# Patient Record
Sex: Male | Born: 1950 | Race: White | Hispanic: No | Marital: Single | Smoking: Former smoker
Health system: Southern US, Community
[De-identification: ages and names within clinical notes are randomized; demographics above are authoritative.]

## PROBLEM LIST (undated history)

## (undated) DIAGNOSIS — E663 Overweight: Secondary | ICD-10-CM

## (undated) DIAGNOSIS — I2510 Atherosclerotic heart disease of native coronary artery without angina pectoris: Secondary | ICD-10-CM

## (undated) DIAGNOSIS — R55 Syncope and collapse: Secondary | ICD-10-CM

## (undated) DIAGNOSIS — G4730 Sleep apnea, unspecified: Secondary | ICD-10-CM

## (undated) DIAGNOSIS — I4821 Permanent atrial fibrillation: Secondary | ICD-10-CM

## (undated) DIAGNOSIS — G459 Transient cerebral ischemic attack, unspecified: Secondary | ICD-10-CM

## (undated) DIAGNOSIS — E119 Type 2 diabetes mellitus without complications: Secondary | ICD-10-CM

## (undated) DIAGNOSIS — C801 Malignant (primary) neoplasm, unspecified: Secondary | ICD-10-CM

## (undated) DIAGNOSIS — I10 Essential (primary) hypertension: Secondary | ICD-10-CM

## (undated) DIAGNOSIS — I4892 Unspecified atrial flutter: Secondary | ICD-10-CM

## (undated) DIAGNOSIS — E785 Hyperlipidemia, unspecified: Secondary | ICD-10-CM

## (undated) DIAGNOSIS — E669 Obesity, unspecified: Secondary | ICD-10-CM

## (undated) HISTORY — DX: Sleep apnea, unspecified: G47.30

## (undated) HISTORY — DX: Obesity, unspecified: E66.9

## (undated) HISTORY — DX: Overweight: E66.3

## (undated) HISTORY — PX: TONSILLECTOMY: SUR1361

## (undated) HISTORY — PX: WISDOM TOOTH EXTRACTION: SHX21

## (undated) HISTORY — DX: Syncope and collapse: R55

## (undated) HISTORY — DX: Atherosclerotic heart disease of native coronary artery without angina pectoris: I25.10

## (undated) HISTORY — DX: Hyperlipidemia, unspecified: E78.5

## (undated) HISTORY — DX: Essential (primary) hypertension: I10

## (undated) HISTORY — DX: Malignant (primary) neoplasm, unspecified: C80.1

## (undated) HISTORY — PX: OTHER SURGICAL HISTORY: SHX169

## (undated) HISTORY — DX: Unspecified atrial flutter: I48.92

## (undated) HISTORY — DX: Permanent atrial fibrillation: I48.21

---

## 2008-05-23 ENCOUNTER — Emergency Department (HOSPITAL_COMMUNITY): Admission: EM | Admit: 2008-05-23 | Discharge: 2008-05-23 | Payer: Self-pay | Admitting: Emergency Medicine

## 2008-06-15 ENCOUNTER — Inpatient Hospital Stay (HOSPITAL_BASED_OUTPATIENT_CLINIC_OR_DEPARTMENT_OTHER): Admission: RE | Admit: 2008-06-15 | Discharge: 2008-06-15 | Payer: Self-pay | Admitting: Cardiology

## 2008-06-20 HISTORY — PX: CARDIAC CATHETERIZATION: SHX172

## 2008-06-22 ENCOUNTER — Ambulatory Visit (HOSPITAL_COMMUNITY): Admission: RE | Admit: 2008-06-22 | Discharge: 2008-06-23 | Payer: Self-pay | Admitting: Interventional Cardiology

## 2008-07-20 ENCOUNTER — Encounter (HOSPITAL_COMMUNITY): Admission: RE | Admit: 2008-07-20 | Discharge: 2008-10-18 | Payer: Self-pay | Admitting: Interventional Cardiology

## 2008-10-20 ENCOUNTER — Encounter (HOSPITAL_COMMUNITY): Admission: RE | Admit: 2008-10-20 | Discharge: 2009-01-01 | Payer: Self-pay | Admitting: Interventional Cardiology

## 2010-03-21 ENCOUNTER — Encounter: Payer: Self-pay | Admitting: Internal Medicine

## 2010-03-29 DIAGNOSIS — R55 Syncope and collapse: Secondary | ICD-10-CM

## 2010-03-29 HISTORY — DX: Syncope and collapse: R55

## 2010-04-25 ENCOUNTER — Encounter: Payer: Self-pay | Admitting: Internal Medicine

## 2010-04-30 DIAGNOSIS — I2510 Atherosclerotic heart disease of native coronary artery without angina pectoris: Secondary | ICD-10-CM | POA: Insufficient documentation

## 2010-04-30 DIAGNOSIS — E782 Mixed hyperlipidemia: Secondary | ICD-10-CM | POA: Insufficient documentation

## 2010-04-30 DIAGNOSIS — I10 Essential (primary) hypertension: Secondary | ICD-10-CM | POA: Insufficient documentation

## 2010-04-30 DIAGNOSIS — I4892 Unspecified atrial flutter: Secondary | ICD-10-CM | POA: Insufficient documentation

## 2010-04-30 HISTORY — DX: Atherosclerotic heart disease of native coronary artery without angina pectoris: I25.10

## 2010-05-01 ENCOUNTER — Ambulatory Visit: Payer: Self-pay | Admitting: Internal Medicine

## 2010-05-01 DIAGNOSIS — R55 Syncope and collapse: Secondary | ICD-10-CM | POA: Insufficient documentation

## 2010-05-01 DIAGNOSIS — I4891 Unspecified atrial fibrillation: Secondary | ICD-10-CM | POA: Insufficient documentation

## 2010-05-28 ENCOUNTER — Encounter: Payer: Self-pay | Admitting: Internal Medicine

## 2010-06-07 ENCOUNTER — Telehealth: Payer: Self-pay | Admitting: Internal Medicine

## 2010-06-10 ENCOUNTER — Encounter: Payer: Self-pay | Admitting: Internal Medicine

## 2010-06-20 ENCOUNTER — Encounter: Payer: Self-pay | Admitting: Internal Medicine

## 2010-06-26 ENCOUNTER — Ambulatory Visit: Payer: Self-pay | Admitting: Internal Medicine

## 2010-07-01 ENCOUNTER — Telehealth: Payer: Self-pay | Admitting: Internal Medicine

## 2010-07-02 ENCOUNTER — Encounter: Payer: Self-pay | Admitting: Internal Medicine

## 2010-07-10 ENCOUNTER — Encounter: Payer: Self-pay | Admitting: Internal Medicine

## 2010-07-17 ENCOUNTER — Encounter: Payer: Self-pay | Admitting: Internal Medicine

## 2010-09-04 ENCOUNTER — Ambulatory Visit: Payer: Self-pay | Admitting: Internal Medicine

## 2010-11-17 LAB — CONVERTED CEMR LAB
BUN: 11 mg/dL (ref 6–23)
CO2: 28 meq/L (ref 19–32)
Calcium: 9.1 mg/dL (ref 8.4–10.5)
Chloride: 107 meq/L (ref 96–112)
Creatinine, Ser: 0.9 mg/dL (ref 0.4–1.5)
GFR calc non Af Amer: 92.97 mL/min (ref >60)
Glucose, Bld: 101 mg/dL — ABNORMAL HIGH (ref 70–99)
Potassium: 5.2 meq/L — ABNORMAL HIGH (ref 3.5–5.1)
Sodium: 142 meq/L (ref 135–145)

## 2010-11-19 NOTE — Letter (Signed)
Summary: [facility]  [facility]   Imported By: Marylou Mccoy 07/11/2010 16:12:34  _____________________________________________________________________  External Attachment:    Type:   Image     Comment:   External Document

## 2010-11-19 NOTE — Assessment & Plan Note (Signed)
Summary: PER CHECK OUT/SF   Visit Type:  Follow-up Referring Provider:  Dr Katrinka Blazing Primary Provider:  Bradd Canary   History of Present Illness: Arthur Rich is a pleasant 60 yo WM with a h/o paroxysmal atrial fibrillation and atrial flutter, CAD s/p LAD PCI, and HTN who presents today for EP follow-up.  He reports initially being diagnosed with atrial flutter 2009 after presenting for colonoscopy and being found to be in atrial flutter 05/23/2008.  He was placed on metoprolol.  He subsequently underwent [facility] 06/22/08 and was found to have CAD in the LAD for which he underwent PCI with a BMS. Upon routine follow-up 10/10, he was found to have atrial fibrillation on his EKG.  The patient was unaware of his afib.  He was therefore placed on coumadin at that time. He reports that 03/29/10, he had a syncopal episode after playing golf.  He had had several alcoholic beverages and had been in the heat all day.  He stood from a seated position and then had sypmtoms of presyncope followed by syncope.  He went to [facility] where he was told that he was in atrial flutter with elevated heart rate.  He was felt to be dehydrated.  He converted to sinus rhythm while being hydrated.  He did well until 04/19/10 when he was again playing golf.  He had been pushing by mouth fluids while playing golf.  He became concerned when he did not urinate and was brought to [facility].  He was found to have atrial fibrillation/ atrial flutter with elevated ventricular rates but converted to sinus rhythm with IV diltiazem.  The patient is unaware of triggers or precipitants of his atrial arrhythmias.  Upon last being seen in our clinic, he was initiated on multaq.  Despite medical therapy with multaq, he continues to have atrial arrhythmias with symptoms of fatigue.  Current Medications (verified): 1)  Metoprolol Succinate 100 Mg Xr24h-Tab (Metoprolol Succinate) .... Take One Tablet By Mouth Two Times A Day 2)   Aspirin 81 Mg Tbec (Aspirin) .... Take One Tablet By Mouth Daily 3)  Lipitor 20 Mg Tabs (Atorvastatin Calcium) .... Take One Tablet By Mouth Daily. 4)  Warfarin Sodium 5 Mg Tabs (Warfarin Sodium) .... Use As Directed By Anticoagulation Clinic 5)  Multaq 400 Mg Tabs (Dronedarone Hcl) .... One By Mouth Two Times A Day  Allergies: 1)  ! Penicillin  Past History:  Past Medical History: Reviewed history from 05/01/2010 and no changes required. Overweight CAD s/p PCI LAD with BMS 9/09 Paroxysmal atrial fibrillation and atrial flutter HTN HL syncope 03/29/10  Past Surgical History: Reviewed history from 05/01/2010 and no changes required. PCI 9/09 tonsellectomy nasal septal surgery wisdom teeth extraction  Social History: Reviewed history from 05/01/2010 and no changes required. Lives in [place] alone.  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  ETOH- no more than 2 beers most days.  Drugs- denies  Review of Systems       All systems are reviewed and negative except as listed in the HPI.   Vital Signs:  Patient profile:   60 year old male Height:      74 inches Weight:      219 pounds BMI:     28.22 Pulse rate:   74 / minute BP sitting:   120 / 70  (left arm)  Vitals Entered By: Laurance Flatten CMA (June 26, 2010 3:18 PM)  Physical Exam  General:  Well developed,  well nourished, in no acute distress. Head:  normocephalic and atraumatic Eyes:  PERRLA/EOM intact; conjunctiva and lids normal. Mouth:  Teeth, gums and palate normal. Oral mucosa normal. Neck:  Neck supple, no JVD. No masses, thyromegaly or abnormal cervical nodes. Lungs:  Clear bilaterally to auscultation and percussion. Heart:  Non-displaced PMI, chest non-tender; regular rate and rhythm, S1, S2 without murmurs, rubs or gallops. Carotid upstroke normal, no bruit. Normal abdominal aortic size, no bruits. Femorals normal pulses, no bruits. Pedals normal pulses. No edema, no  varicosities. Abdomen:  Bowel sounds positive; abdomen soft and non-tender without masses, organomegaly, or hernias noted. No hepatosplenomegaly. Msk:  Back normal, normal gait. Muscle strength and tone normal. Pulses:  pulses normal in all 4 extremities Extremities:  No clubbing or cyanosis. Neurologic:  Alert and oriented x 3.   EKG  Procedure date:  06/26/2010  Findings:      typical appearing atrial flutter,  V rate 75 bpm,  Qtc 430  Impression & Recommendations:  Problem # 1:  ATRIAL FIBRILLATION, PAROXYSMAL (ICD-427.31) The patient has both atrial fibrillation and atrial flutter.  I have reviewed his recent event monitor which reveals more afib than atrial flutter.  V rates are elevated at times.   He reports symptoms of fatigue.  He remains in atrial flutter despite medical therapy with multaq. Given the recent results of the Pallis trial, he and I are both concerned about using multaq for rate control with his persistent atrial arrhythmias.  We therefore agree to stop multaq at this time.  I have discussed tikosyn as a  reasonable alternative.  We also discussed chronic rate control and ablation as alternative strategies.  At this point, he wishes to further contemplate his options.  He will return in 4 weeks to decide which approach to take.  He is presently anticoagulated with coumadin.  I have reviewed the recent echo from [facility] which reveals preserved EF with mild LA enlargement (4.1cm).  He does not have significant valvular heart disease.  Problem # 2:  ESSENTIAL HYPERTENSION, BENIGN (ICD-401.1) stable  Problem # 3:  SYNCOPE (ICD-780.2) no further episodes  Other Orders: EKG w/ Interpretation (93000)  Patient Instructions: 1)  Your physician recommends that you schedule a follow-up appointment in: 4 weeks 2)  Your physician has recommended you make the following change in your medication: stop Multaq

## 2010-11-19 NOTE — Assessment & Plan Note (Signed)
Summary: nep/eval ablation/afib   Referring Provider:  Dr Katrinka Blazing Primary Provider:  Bradd Canary  CC:  atrial fibrillation and atrial flutter.  History of Present Illness: [name] is a pleasant 60 yo WM with a h/o paroxysmal atrial fibrillation and atrial flutter, CAD s/p LAD PCI, and HTN who presents today for EP consultation regarding his atrial arrhythmias.  He reports initially being diagnosed with atrial flutter 2009 after presenting for colonoscopy and being found to be in atrial flutter 05/23/2008.  He was placed on metoprolol.  He subsequently underwent [facility] 06/22/08 and was found to have CAD in the LAD for which he underwent PCI with a BMS. Upon routine follow-up 10/10, he was found to have atrial fibrillation on his EKG.  The patient was unaware of his afib.  He was therefore placed on coumadin at that time. He reports that 03/29/10, he had a syncopal episode after playing golf.  He had had several alcoholic beverages and had been in the heat all day.  He stood from a seated position and then had sypmtoms of presyncope followed by syncope.  He went to [facility] where he was told that he was in atrial flutter with elevated heart rate.  He was felt to be dehydrated.  He converted to sinus rhythm while being hydrated.  He did well until 04/19/10 when he was again playing golf.  He had been pushing by mouth fluids while playing golf.  He became concerned when he did not urinate and was brought to [facility].  He was found to have atrial fibrillation/ atrial flutter with elevated ventricular rates but converted to sinus rhythm with IV diltiazem.  The patient is unaware of triggers or precipitants of his atrial arrhythmias.  Current Medications (verified): 1)  Metoprolol Succinate 50 Mg Xr24h-Tab (Metoprolol Succinate) .... 2 Tabs Two Times A Day 2)  Aspirin 81 Mg Tbec (Aspirin) .... Take One Tablet By Mouth Daily 3)  Lipitor 20 Mg Tabs (Atorvastatin Calcium) .... Take One  Tablet By Mouth Daily. 4)  Warfarin Sodium 5 Mg Tabs (Warfarin Sodium) .... Use As Directed By Anticoagulation Clinic  Allergies (verified): 1)  ! 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 Maternal uncle had an MI early 17s.  Social History: Lives in [place] alone.  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  ETOH- no more than 2 beers most days.  Drugs- denies  [[id number]]  Review of Systems       All systems are reviewed and negative except as listed in the HPI.   Vital Signs:  Patient profile:   60 year old male Height:      74 inches Weight:      262 pounds BMI:     33.76 Pulse rate:   80 / minute BP sitting:   144 / 92  (left arm)  Vitals Entered By: Laurance Flatten CMA (May 01, 2010 3:58 PM)  Physical Exam  General:  Well developed, well nourished, in no acute distress. Head:  normocephalic and atraumatic Eyes:  PERRLA/EOM intact; conjunctiva and lids normal. Mouth:  Teeth, gums and palate normal. Oral mucosa normal. Neck:  Neck supple, no JVD. No masses, thyromegaly or abnormal cervical nodes. Lungs:  Clear  bilaterally to auscultation and percussion. Heart:  Non-displaced PMI, chest non-tender; regular rate and rhythm, S1, S2 without murmurs, rubs or gallops. Carotid upstroke normal, no bruit. Normal abdominal aortic size, no bruits. Femorals normal pulses, no bruits. Pedals normal pulses. No edema, no varicosities. Abdomen:  Bowel sounds positive; abdomen soft and non-tender without masses, organomegaly, or hernias noted. No hepatosplenomegaly. Msk:  Back normal, normal gait. Muscle strength and tone normal. Pulses:  pulses normal in all 4 extremities Extremities:   No clubbing or cyanosis. Neurologic:  Alert and oriented x 3. Skin:  Intact without lesions or rashes. Cervical Nodes:  no significant adenopathy Psych:  Normal affect.   EKG  Procedure date:  05/01/2010  Findings:      sinus bradycardia 58 bpm, PR 156, Qtc , otherwise normal ekg  Holter Monitor  Procedure date:  03/27/2010  Findings:      The patient has both coarse afib and atrial flutter (more fib than flutter).  He is in an atrial arrhythmia 37% of the time.  HR range 31 (nocturnal) to 158 bpm, Average HR 68 bpm 3 second post conversion pause noted.  Comments:      Placed by Dr Halina Andreas Cardiology  Impression & Recommendations:  Problem # 1:  ATRIAL FIBRILLATION, PAROXYSMAL (ICD-427.31) The patient has both atrial fibrillation and atrial flutter.  I have reviewed his recent event monitor which reveals more afib than atrial flutter.  V rates are elevated at times.  He also has bradycardia, more pronounced at night.  The patient has minimal symptoms.  Therapeutic strategies for afib including medicine and ablation were discussed in detail with the patient today. Risk, benefits, and alternatives to EP study and radiofrequency ablation for afib were also discussed in detail today. As he has failed rate control, I would recommend that we begin a rhyhtm control strategy at this time.  He has known CAD and is therefore not a candidate for Ic agents.  We will start Multaq 400mg  two times a day today.  IF he does not tolerate multaq, then I would consider tikosyn.  Given a relative paucity of symptoms, I would not proceed directly to ablation of afib or atrial flutter at this time.  His CHADS2 score is 1.  He is appropriately anticoagulated with coumadin.  Problem # 2:  CAD (ICD-414.00) stable no changes today  Problem # 3:  SYNCOPE (ICD-780.2) The patient's syncopal episode in June was likely due to dehydration by history.  His ekg today is benign appearing.  I will ask Dr  Katrinka Blazing to perform an echo to evaluate for structual heart disease.  The patient's last echo was several years ago (I do not have access to this).  Patient Instructions: 1)  Your physician recommends that you schedule a follow-up appointment in: 8 weeks with Dr Johney Frame 2)  Your physician has recommended you make the following change in your medication: start Multaq 400mg  two times a day Prescriptions: MULTAQ 400 MG TABS (DRONEDARONE HCL) one by mouth two times a day  #60 x 3   Entered by:   Dennis Bast, RN, BSN   Authorized by:   Hillis Range, MD   Signed by:   Dennis Bast, RN, BSN on 05/01/2010   Method used:   Electronically to        [id number] (retail)       [address]       [place], [place]  [address]       Ph:  [phone] or [phone]       Fax: [phone]   RxID:   [id number]

## 2010-11-19 NOTE — Letter (Signed)
Summary: Arthur Rich   Imported By: Marylou Mccoy 07/16/2010 09:41:19  _____________________________________________________________________  External Attachment:    Type:   Image     Comment:   External Document

## 2010-11-19 NOTE — Progress Notes (Signed)
Summary: questions re ekg  Phone Note Call from Patient   Caller: Patient Reason for Call: Talk to Nurse Summary of Call: pt to have weekly ekg's at work and the nurse there wants to know if it needs to be a 12 lead ekg or will a rhythm strip do? pt [phone] Initial call taken by: Glynda Jaeger,  July 01, 2010 2:15 PM  Follow-up for Phone Call        Did you want him to have weekly EKG's?  He is due for follow up on 07/29/10 at 3:15pm.  He still hasn't decided about ablation yet.  Will call pt back after DrAllred reviews Dennis Bast, RN, BSN  July 01, 2010 2:26 PM  Additional Follow-up for Phone Call Additional follow up Details #1::        12 lead ekgs are preferred Dr Hillis Range Pt aware and will have EKG's faxed Dennis Bast, RN, BSN  July 03, 2010 4:01 PM

## 2010-11-19 NOTE — Progress Notes (Signed)
Summary: appt  Phone Note Call from Patient Call back at Home Phone [phone]   Caller: Patient Summary of Call: pt calling to set up an echo states Dr Johney Frame wanted him to have an echo. I do not see an order for this. please call pt concerning the echo Initial call taken by: Edman Circle,  June 07, 2010 2:29 PM  Follow-up for Phone Call        spoke with  [facility]  Dr. Michaelle Copas nurse she has already spoken with pt and Bonita Quin will call to schedule Echo Monday. Dennis Bast, RN, BSN  June 07, 2010 4:56 PM

## 2010-11-19 NOTE — Assessment & Plan Note (Signed)
Summary: PER CHECK OUT/SF   Referring Provider:  Dr Katrinka Blazing Primary Provider:  Bradd Canary  CC:  ROV; No Complaints.  History of Present Illness: The patient presents today for routine electrophysiology followup. He reports doing very well since last being seen in our clinic.  He is unaware of any further afib or atrial flutter. The patient denies symptoms of palpitations, chest pain, shortness of breath, orthopnea, PND, lower extremity edema, dizziness, presyncope, syncope, or neurologic sequela. The patient is tolerating medications without difficulties and is otherwise without complaint today.   Problems Prior to Update: 1)  Syncope  (ICD-780.2) 2)  Atrial Fibrillation, Paroxysmal  (ICD-427.31) 3)  Hyperlipidemia  (ICD-272.4) 4)  Cad  (ICD-414.00) 5)  Essential Hypertension, Benign  (ICD-401.1) 6)  Encounter For Long-term Use of Anticoagulants  (ICD-V58.61) 7)  Atrial Flutter  (ICD-427.32)  Medications Prior to Update: 1)  Metoprolol Succinate 100 Mg Xr24h-Tab (Metoprolol Succinate) .... Take One Tablet By Mouth Two Times A Day 2)  Aspirin 81 Mg Tbec (Aspirin) .... Take One Tablet By Mouth Daily 3)  Lipitor 20 Mg Tabs (Atorvastatin Calcium) .... Take One Tablet By Mouth Daily. 4)  Warfarin Sodium 5 Mg Tabs (Warfarin Sodium) .... Use As Directed By Anticoagulation Clinic  Current Medications (verified): 1)  Metoprolol Succinate 100 Mg Xr24h-Tab (Metoprolol Succinate) .... Take One Tablet By Mouth Two Times A Day 2)  Aspirin 81 Mg Tbec (Aspirin) .... Take One Tablet By Mouth Daily 3)  Lipitor 20 Mg Tabs (Atorvastatin Calcium) .... Take One Tablet By Mouth Daily. 4)  Warfarin Sodium 5 Mg Tabs (Warfarin Sodium) .... Use As Directed By Anticoagulation Clinic  Allergies: 1)  ! Penicillin  Past History:  Past Medical History: Reviewed history from 05/01/2010 and no changes required. Overweight CAD s/p PCI LAD with BMS 9/09 Paroxysmal atrial fibrillation and atrial  flutter HTN HL syncope 03/29/10  Past Surgical History: Reviewed history from 05/01/2010 and no changes required. PCI 9/09 tonsellectomy nasal septal surgery wisdom teeth extraction  Social History: Reviewed history from 05/01/2010 and no changes required. Lives in [place] alone.  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  ETOH- no more than 2 beers most days.  Drugs- denies  Review of Systems       All systems are reviewed and negative except as listed in the HPI.   Vital Signs:  Patient profile:   60 year old male Height:      73 inches Weight:      261 pounds BMI:     34.56 Pulse rate:   49 / minute BP sitting:   180 / 98  (left arm) Cuff size:   regular  Vitals Entered By: Stanton Kidney, EMT-P (September 04, 2010 11:44 AM)  Physical Exam  General:  Well developed, well nourished, in no acute distress. Head:  normocephalic and atraumatic Eyes:  PERRLA/EOM intact; conjunctiva and lids normal. Mouth:  Teeth, gums and palate normal. Oral mucosa normal. Neck:  Neck supple, no JVD. No masses, thyromegaly or abnormal cervical nodes. Lungs:  Clear bilaterally to auscultation and percussion. Heart:  Non-displaced PMI, chest non-tender; regular rate and rhythm, S1, S2 without murmurs, rubs or gallops. Carotid upstroke normal, no bruit. Normal abdominal aortic size, no bruits. Femorals normal pulses, no bruits. Pedals normal pulses. No edema, no varicosities. Abdomen:  Bowel sounds positive; abdomen soft and non-tender without masses, organomegaly, or hernias noted. No hepatosplenomegaly. Msk:  Back normal, normal gait. Muscle strength and tone normal. Pulses:  pulses  normal in all 4 extremities Extremities:  No clubbing or cyanosis. Neurologic:  Alert and oriented x 3.   EKG  Procedure date:  09/04/2010  Findings:      sinus rhythm 50 bpm, otherwise normal ekg  Impression & Recommendations:  Problem # 1:  ATRIAL FIBRILLATION, PAROXYSMAL  (ICD-427.31)  His updated medication list for this problem includes:    Metoprolol Succinate 100 Mg Xr24h-tab (Metoprolol succinate) .Marland Kitchen... Take one tablet by mouth two times a day    Aspirin 81 Mg Tbec (Aspirin) .Marland Kitchen... Take one tablet by mouth daily    Warfarin Sodium 5 Mg Tabs (Warfarin sodium) ..... Use as directed by anticoagulation clinic  His updated medication list for this problem includes:    Metoprolol Succinate 100 Mg Xr24h-tab (Metoprolol succinate) .Marland Kitchen... Take one tablet by mouth two times a day    Aspirin 81 Mg Tbec (Aspirin) .Marland Kitchen... Take one tablet by mouth daily    Warfarin Sodium 5 Mg Tabs (Warfarin sodium) ..... Use as directed by anticoagulation clinic  Orders: TLB-BMP (Basic Metabolic Panel-BMET) (80048-METABOL)  Problem # 2:  ESSENTIAL HYPERTENSION, BENIGN (ICD-401.1) above goal add lisinopril 10mg  daily today BMET today he is instructed to follow-up with Dr Katrinka Blazing for BP management and BMET in the next few months  Problem # 3:  CAD (ICD-414.00)  no symptomats of ischemia  His updated medication list for this problem includes:    Metoprolol Succinate 100 Mg Xr24h-tab (Metoprolol succinate) .Marland Kitchen... Take one tablet by mouth two times a day    Aspirin 81 Mg Tbec (Aspirin) .Marland Kitchen... Take one tablet by mouth daily    Warfarin Sodium 5 Mg Tabs (Warfarin sodium) ..... Use as directed by anticoagulation clinic    Lisinopril 10 Mg Tabs (Lisinopril) ..... One by mouth daily  Patient Instructions: 1)  Your physician wants you to follow-up in:  6 months with Dr Johney Frame  Bonita Quin will receive a reminder letter in the mail two months in advance. If you don't receive a letter, please call our office to schedule the follow-up appointment. 2)  Your physician has recommended you make the following change in your medication: start Lisinopril 10mg  daily 3)  Your physician recommends that you return for lab work BMP Prescriptions: LISINOPRIL 10 MG TABS (LISINOPRIL) one by mouth daily  #30 x 11    Entered by:   Dennis Bast, RN, BSN   Authorized by:   Hillis Range, MD   Signed by:   Dennis Bast, RN, BSN on 09/04/2010   Method used:   Electronically to        [id number] (retail)       [address]       [place], [place]  [address]       Ph: [phone] or [phone]       Fax: [phone]   RxID:   [id number]

## 2010-11-19 NOTE — Letter (Signed)
Summary: [facility] Office Visit Note   [facility] Physicians Office Visit Note   Imported By: Roderic Ovens 05/10/2010 11:25:04  _____________________________________________________________________  External Attachment:    Type:   Image     Comment:   External Document

## 2010-11-19 NOTE — Letter (Signed)
Summary: Arthur Rich Physicians Office Visit Note   [facility] Physicians Office Visit Note   Imported By: Roderic Ovens 05/10/2010 11:24:32  _____________________________________________________________________  External Attachment:    Type:   Image     Comment:   External Document

## 2011-02-19 ENCOUNTER — Encounter: Payer: Self-pay | Admitting: Internal Medicine

## 2011-02-26 ENCOUNTER — Encounter: Payer: Self-pay | Admitting: Internal Medicine

## 2011-02-26 ENCOUNTER — Ambulatory Visit (INDEPENDENT_AMBULATORY_CARE_PROVIDER_SITE_OTHER): Payer: BC Managed Care – PPO | Admitting: Internal Medicine

## 2011-02-26 DIAGNOSIS — I2510 Atherosclerotic heart disease of native coronary artery without angina pectoris: Secondary | ICD-10-CM

## 2011-02-26 DIAGNOSIS — I10 Essential (primary) hypertension: Secondary | ICD-10-CM

## 2011-02-26 DIAGNOSIS — R55 Syncope and collapse: Secondary | ICD-10-CM

## 2011-02-26 DIAGNOSIS — I4891 Unspecified atrial fibrillation: Secondary | ICD-10-CM

## 2011-02-26 NOTE — Patient Instructions (Signed)
Your physician wants you to follow-up in: 6 months with Dr Allred You will receive a reminder letter in the mail two months in advance. If you don't receive a letter, please call our office to schedule the follow-up appointment.  Your physician recommends that you continue on your current medications as directed. Please refer to the Current Medication list given to you today.   

## 2011-03-02 ENCOUNTER — Encounter: Payer: Self-pay | Admitting: Internal Medicine

## 2011-03-02 NOTE — Assessment & Plan Note (Addendum)
No ischemic symptoms  Pt to follow with Dr Katrinka Blazing.

## 2011-03-02 NOTE — Assessment & Plan Note (Signed)
No recurrence EKG and echo has been benign Prior syncope likely due to dehydration We will follow.

## 2011-03-02 NOTE — Assessment & Plan Note (Addendum)
He continues to have episodes of atrial fibrillation/ atrial flutter but does not wish to start AAD at this time. He has made significant effort to avoid ETOH.  We should consider either catheter ablation or AAD if his afib burden increases, however he wishes to do neither at this time. Continue coumadin.

## 2011-03-02 NOTE — Progress Notes (Signed)
The patient presents today for routine electrophysiology followup.  Since last being seen in our clinic, the patient reports doing very well.  He remains active.  He has occasional episodes of irregular palpitations which he attributes to atrial fibrillation.  Episodes typically last less than 1 hour.  He has made significant efforts to quit drinking ETOH.  He wishes to avoid AAD at this time.  Today, he denies symptoms of chest pain, shortness of breath, orthopnea, PND, lower extremity edema, dizziness, presyncope, syncope, or neurologic sequela.  The patient feels that he is tolerating medications without difficulties and is otherwise without complaint today.   Past Medical History  Diagnosis Date  . Overweight   . Coronary artery disease     PCI LAD with BMS 9/09  . Paroxysmal atrial fibrillation   . Hypertension   . Hyperlipidemia   . Syncope and collapse 03/29/2010   Past Surgical History  Procedure Date  . Cardiac catheterization 9/09    PCI LAD (BMS)  . Tonsillectomy   . Nasal septal surgery   . Wisdom tooth extraction     Current Outpatient Prescriptions  Medication Sig Dispense Refill  . aspirin 81 MG tablet Take 81 mg by mouth daily.        Marland Kitchen atorvastatin (LIPITOR) 20 MG tablet Take 20 mg by mouth daily.        . cholecalciferol (VITAMIN D) 1000 UNITS tablet Take 1,000 Units by mouth daily.        Marland Kitchen lisinopril (PRINIVIL,ZESTRIL) 10 MG tablet Take 10 mg by mouth daily.        . metoprolol (TOPROL-XL) 100 MG 24 hr tablet Take 100 mg by mouth 2 (two) times daily.        Marland Kitchen warfarin (COUMADIN) 5 MG tablet Take 5 mg by mouth daily. Use as directed by anticoagulation clinic         Allergies  Allergen Reactions  . Penicillins     History   Social History  . Marital Status: Single    Spouse Name: N/A    Number of Children: N/A  . Years of Education: N/A   Occupational History  . Not on file.   Social History Main Topics  . Smoking status: Never Smoker   . Smokeless  tobacco: Not on file  . Alcohol Use: Yes     previously heavy, now trying to cut back  . Drug Use: No  . Sexually Active: Not on file   Other Topics Concern  . Not on file   Social History Narrative  . No narrative on file    Family History  Problem Relation Age of Onset  . Diabetes Father   . Heart attack Maternal Uncle     early 50's    ROS-  All systems are reviewed and are negative except as outlined in the HPI above    Physical Exam: Filed Vitals:   02/26/11 1631  BP: 110/84  Pulse: 62  Height: 6\' 1"  (1.854 m)  Weight: 260 lb (117.935 kg)    GEN- The patient is well appearing, alert and oriented x 3 today.   Head- normocephalic, atraumatic Eyes-  Sclera clear, conjunctiva pink Ears- hearing intact Oropharynx- clear Neck- supple, no JVP Lymph- no cervical lymphadenopathy Lungs- Clear to ausculation bilaterally, normal work of breathing Heart- Regular rate and rhythm, no murmurs, rubs or gallops, PMI not laterally displaced GI- soft, NT, ND, + BS Extremities- no clubbing, cyanosis, or edema MS- no significant deformity or atrophy  Skin- no rash or lesion Psych- euthymic mood, full affect Neuro- strength and sensation are intact  EKG today reveals sinus rhythm 62 bpm, PR 162, QRS 84, Qtc 416, otherwise normal ekg  Echo from 06/10/10 reviewed Assessment and Plan:

## 2011-03-02 NOTE — Assessment & Plan Note (Signed)
Controlled. No changes. 

## 2011-03-04 NOTE — Cardiovascular Report (Signed)
Arthur Rich, Arthur Rich             ACCOUNT NO.:  [id number]   MEDICAL RECORD NO.:  [id number]          PATIENT TYPE:  OIB   LOCATION:  6527                         FACILITY:  [facility]   PHYSICIAN:  Lyn Records, M.D.   DATE OF BIRTH:  Aug 26, 1951   DATE OF PROCEDURE:  06/22/2008  DATE OF DISCHARGE:                            CARDIAC CATHETERIZATION   PRIMARY CARE PHYSICIAN:  Quita Skye. Artis Flock, M.D.   INDICATION FOR PROCEDURE:  Recently diagnosed high-grade eccentric focal  proximal LAD lesion.  Positive exercise treadmill test.  No symptoms.  Silent ischemia with large ischemic burden and relatively low threshold  for ischemia induction despite absence of symptoms.   PROCEDURE:  1. Bare metal stent proximal LAD.  2. Intravascular ultrasound.   DESCRIPTION:  After informed consent and following 2 mg of IV Versed and  50 mcg of fentanyl, 6-French sheath was placed in the right femoral  artery using modified Seldinger technique.  A 1% Xylocaine was used for  local anesthesia.  A 6-French 3.5 CLS guide catheter was then advanced  to the ascending aorta and the left main ostium.  Guiding shots were  obtained.  A bolus followed by an infusion of bivalirudin 0.75  mg/kg/1.75 mg/kg per hour, was administered to achieve an ACT greater  than 300.  The patient received 600 mg of Plavix in the day hospital.  The patient had been on oral Plavix for at least a week prior to coming  to the hospital.  An [facility] guidewire was used to close the  stenosis in the LAD.  We attempted to wire a large first diagonal, but  because of a severe angulation in the origin from the LAD, we could not  easily accomplish this and decided to proceed with stenting without side  branch protection.   We initially wanted to perform all of these before progressing to  intervention as the lesion was very eccentric and difficult to lay out.  Because the IVUS machine was being used in another room, we went ahead  and  did direct stenting of the proximal LAD lesion using a Liberty 12 x  3.5 mm balloon.  We did 2 inflations with peak pressure of 13  atmospheres.  We then postdilated with an 8-mm long x 3.75-mm diameter  balloon to 13 atmospheres.  The patient experienced no chest discomfort  with any of the balloon inflations.   We were ultimately able to perform all of these post stent deployment  and demonstrated no significant regions of under expansion or stent  malapposition.  The distal reference diameter was 3.21 x 3.87 just  outside the distal margin of the stent.  This region of the LAD was  actually larger than the proximal LAD.   The percent stenosis was reduced from greater than and equal to 80%-0%  with TIMI grade 3 flow.   Angio-Seal was used for hemostasis with no complications.   CONCLUSIONS:  1. Successful direct stenting of the proximal LAD eccentric lesion      from 70-80% down to 0% with TIMI grade 3 flow.  2. IVUS demonstration of  stent deployment without evidence of      malapposition or under expansion.   PLAN:  Plavix for at least [redacted] weeks along with aspirin.   DISCHARGE:  June 23, 2008.       Lyn Records, M.D.  Electronically Signed     HWS/MEDQ  D:  06/22/2008  T:  06/23/2008  Job:  [id number]   cc:   Quita Skye. Artis Flock, M.D.

## 2011-03-04 NOTE — Consult Note (Signed)
Arthur Rich, Arthur Rich             ACCOUNT NO.:  [id number]   MEDICAL RECORD NO.:  [id number]          PATIENT TYPE:  EMS   LOCATION:  MAJO                         FACILITY:  [facility]   PHYSICIAN:  Lyn Records, M.D.   DATE OF BIRTH:  09/24/1951   DATE OF CONSULTATION:  05/23/2008  DATE OF DISCHARGE:                                 CONSULTATION   REASON FOR EVALUATION:  Atrial flutter.   SUBJECTIVE:  The patient is a 60 year old gentleman who was at the [facility] when prior to his colonoscopy, he was noted to be in  a narrow complex tachycardia about 150 beats per minute.  On further  evaluation, it was felt he was in atrial flutter.  He was transported to  the emergency room.  By the time he arrived in the ER, the heart rhythm  had dissipated.  The patient was asymptomatic throughout the entire or  deal.  He has no history of prior arrhythmias.  He denies chest pain,  orthopnea, PND, exertional dyspnea, or syncope.   SIGNIFICANT PAST MEDICAL PROBLEMS:  1. Hyperlipidemia.  2. Elevated, but untreated blood pressure.   ALLERGIES:  PENICILLIN.   MEDICATIONS:  Lipitor 10 mg per day.   PAST MEDICAL HISTORY:  Sinus surgery in 1970.   FAMILY HISTORY:  Father died suddenly at age 31, had a history of  coronary artery disease and diabetes.  Mother is alive and well.  Three  sisters are alive and well.   REVIEW OF SYSTEMS:  Unremarkable.   SOCIAL HISTORY:  Does not smoke, quit smoking 9 years ago, smoked about  50-pack years.  Two to three beers per day, no more than 20 per week.  He is a Engineer, building services at [employer].   PHYSICAL EXAMINATION:  GENERAL:  The patient is in no acute distress.  VITAL SIGNS:  Blood pressure is 160/90, heart rate 90, and respirations  16.  SKIN:  Clear.  HEENT:  Unremarkable.  Multiple dental caries are noted.  LUNGS:  Clear.  CARDIAC:  No murmur, no rub, no click, or no gallop.  ABDOMEN:  Soft.  EXTREMITIES:  No edema.  Lower  extremities reveal varicose veins, left  leg greater than right.  NEUROLOGIC:  No motor or sensory deficits.   EKG reveals normal sinus rhythm QS pattern in V1 and V2, rhythm strips  of the arrhythmia revealed definite atrial flutter with ventricular  response of 150 beats per minute.  Chest x-ray, not available.  Initial  cardiac POC marker is normal.   ASSESSMENT:  1. Paroxysmal atrial flutter, asymptomatic.  2. Elevated blood pressure.   PLAN:  1. Rule out MI with serial markers.  2. Check TSH.  3. Outpatient Holter and echo.  4. May need stress test.  5. May need therapy for hypertension.  6. Clinical followup within the next 7 days.      Lyn Records, M.D.  Electronically Signed     HWS/MEDQ  D:  05/23/2008  T:  05/24/2008  Job:  [id number]   cc:   Quita Skye. Artis Flock, M.D.  John C.  Madilyn Fireman, M.D.

## 2011-07-18 LAB — BASIC METABOLIC PANEL
BUN: 8
CO2: 26
Calcium: 9.3
Chloride: 108
Creatinine, Ser: 0.98
GFR calc Af Amer: >60
GFR calc non Af Amer: >60
Glucose, Bld: 113 — ABNORMAL HIGH
Potassium: 4
Sodium: 141

## 2011-07-18 LAB — TSH: TSH: 1.052

## 2011-07-18 LAB — DIFFERENTIAL
Basophils Absolute: 0
Basophils Relative: 0
Eosinophils Absolute: 0
Eosinophils Relative: 1
Lymphocytes Relative: 11 — ABNORMAL LOW
Lymphs Abs: 1.1
Monocytes Absolute: 0.6
Monocytes Relative: 6
Neutro Abs: 7.6
Neutrophils Relative %: 81 — ABNORMAL HIGH

## 2011-07-18 LAB — POCT CARDIAC MARKERS
CKMB, poc: 1.6
CKMB, poc: 1.7
Myoglobin, poc: 110
Myoglobin, poc: 85.7
Troponin i, poc: <0.05
Troponin i, poc: <0.05

## 2011-07-18 LAB — CBC
HCT: 44.7
Hemoglobin: 15.7
MCHC: 35.1
MCV: 87
Platelets: 235
RBC: 5.14
RDW: 13.8
WBC: 9.3

## 2011-07-23 LAB — BASIC METABOLIC PANEL
BUN: 9
CO2: 25
Calcium: 9.1
Chloride: 107
Creatinine, Ser: 0.85
GFR calc Af Amer: >60
GFR calc non Af Amer: >60
Glucose, Bld: 103 — ABNORMAL HIGH
Potassium: 3.6
Sodium: 140

## 2011-07-23 LAB — CBC
HCT: 42.1
Hemoglobin: 14.3
MCHC: 33.9
MCV: 89.9
Platelets: 200
RBC: 4.68
RDW: 14.1
WBC: 7.2

## 2011-07-23 LAB — POCT I-STAT, CHEM 8
BUN: 12
Calcium, Ion: 1.12
Chloride: 108
Creatinine, Ser: 1
Glucose, Bld: 124 — ABNORMAL HIGH
HCT: 42
Hemoglobin: 14.3
Potassium: 3.8
Sodium: 141
TCO2: 25

## 2011-08-27 ENCOUNTER — Ambulatory Visit (INDEPENDENT_AMBULATORY_CARE_PROVIDER_SITE_OTHER): Payer: BC Managed Care – PPO | Admitting: Internal Medicine

## 2011-08-27 ENCOUNTER — Encounter: Payer: Self-pay | Admitting: Internal Medicine

## 2011-08-27 VITALS — BP 129/81 | HR 80 | Ht 74.0 in | Wt 261.0 lb

## 2011-08-27 DIAGNOSIS — R55 Syncope and collapse: Secondary | ICD-10-CM

## 2011-08-27 DIAGNOSIS — I4891 Unspecified atrial fibrillation: Secondary | ICD-10-CM

## 2011-08-27 DIAGNOSIS — I10 Essential (primary) hypertension: Secondary | ICD-10-CM

## 2011-08-27 MED ORDER — LISINOPRIL 10 MG PO TABS: 10.0000 mg | ORAL_TABLET | Freq: Every day | ORAL | Status: DC

## 2011-08-27 NOTE — Assessment & Plan Note (Signed)
Stable No change required today  

## 2011-08-27 NOTE — Assessment & Plan Note (Signed)
No recurrence EKG and echo has been benign Prior syncope likely due to dehydration We will follow.

## 2011-08-27 NOTE — Progress Notes (Signed)
The patient presents today for routine electrophysiology followup.  Since last being seen in our clinic, the patient reports doing very well.  He remains active.  He has rare episodes of irregular palpitations which he attributes to atrial fibrillation.  Episodes typically last less than 1 hour.  He otherwise feels well and reports good exercise tolerance.  He denjies fatigue. Today, he denies symptoms of chest pain, shortness of breath, orthopnea, PND, lower extremity edema, dizziness, presyncope, syncope, or neurologic sequela.  The patient feels that he is tolerating medications without difficulties and is otherwise without complaint today.   Past Medical History  Diagnosis Date  . Overweight   . Coronary artery disease     PCI LAD with BMS 9/09  . Persistent atrial fibrillation   . Hypertension   . Hyperlipidemia   . Syncope and collapse 03/29/2010  . Atrial flutter    Past Surgical History  Procedure Date  . Cardiac catheterization 9/09    PCI LAD (BMS)  . Tonsillectomy   . Nasal septal surgery   . Wisdom tooth extraction     Current Outpatient Prescriptions  Medication Sig Dispense Refill  . aspirin 81 MG tablet Take 81 mg by mouth daily.        Marland Kitchen atorvastatin (LIPITOR) 20 MG tablet Take 20 mg by mouth daily.        . cholecalciferol (VITAMIN D) 1000 UNITS tablet Take 1,000 Units by mouth daily.        Marland Kitchen lisinopril (PRINIVIL,ZESTRIL) 10 MG tablet Take 1 tablet (10 mg total) by mouth daily.  30 tablet  6  . metoprolol (TOPROL-XL) 100 MG 24 hr tablet Take 100 mg by mouth 2 (two) times daily.        Marland Kitchen warfarin (COUMADIN) 5 MG tablet Take 5 mg by mouth daily. Use as directed by anticoagulation clinic       . DISCONTD: lisinopril (PRINIVIL,ZESTRIL) 10 MG tablet Take 10 mg by mouth daily.          Allergies  Allergen Reactions  . Penicillins     History   Social History  . Marital Status: Single    Spouse Name: N/A    Number of Children: N/A  . Years of Education: N/A    Occupational History  . Not on file.   Social History Main Topics  . Smoking status: Former Smoker -- 1.0 packs/day for 30 years    Types: Cigarettes    Quit date: 08/26/1996  . Smokeless tobacco: Never Used  . Alcohol Use: Yes     previously heavy, now trying to cut back  . Drug Use: No  . Sexually Active: Not on file   Other Topics Concern  . Not on file   Social History Narrative  . No narrative on file    Family History  Problem Relation Age of Onset  . Diabetes Father   . Heart attack Maternal Uncle     early 50's    ROS-  All systems are reviewed and are negative except as outlined in the HPI above    Physical Exam: Filed Vitals:   08/27/11 1614  BP: 129/81  Pulse: 80  Height: 6\' 2"  (1.88 m)  Weight: 261 lb (118.389 kg)    GEN- The patient is well appearing, alert and oriented x 3 today.   Head- normocephalic, atraumatic Eyes-  Sclera clear, conjunctiva pink Ears- hearing intact Oropharynx- clear Neck- supple, no JVP Lymph- no cervical lymphadenopathy Lungs- Clear to ausculation bilaterally,  normal work of breathing Heart- irregular rate and rhythm, no murmurs, rubs or gallops, PMI not laterally displaced GI- soft, NT, ND, + BS Extremities- no clubbing, cyanosis, or edema MS- no significant deformity or atrophy Skin- no rash or lesion Psych- euthymic mood, full affect Neuro- strength and sensation are intact  EKG today reveals afib , V rate 80 bpm  Echo from 06/10/10 reviewed Assessment and Plan:

## 2011-08-27 NOTE — Patient Instructions (Signed)
Your physician recommends that you schedule a follow-up appointment in: 5 weeks with Dr Johney Frame   Your physician has requested that you have an echocardiogram. Echocardiography is a painless test that uses sound waves to create images of your heart. It provides your doctor with information about the size and shape of your heart and how well your heart's chambers and valves are working. This procedure takes approximately one hour. There are no restrictions for this procedure.

## 2011-08-27 NOTE — Assessment & Plan Note (Signed)
He continues to have episodes of atrial fibrillation/ atrial flutter but does not wish to start AAD at this time.  He is in afib today but appears to be mostly asymptomatic. I have offered initiation of tikosyn vs rate control.  I would avoid catheter ablation presently given his relative absence of symptoms. He is clear that he wishes to continue rate control for now. He will continue coumadin for stroke prevention.  We discussed pradaxa and xarelto as alternatives, though he would like to stay with coumadin at this point. No changes today  We will obtain an echo as his last echo was 8/11. I will see him again in 6 weeks to further assess for symptoms.

## 2011-08-28 ENCOUNTER — Other Ambulatory Visit: Payer: Self-pay | Admitting: Internal Medicine

## 2011-09-03 ENCOUNTER — Ambulatory Visit (HOSPITAL_COMMUNITY): Payer: BC Managed Care – PPO | Attending: Cardiology | Admitting: Radiology

## 2011-09-03 DIAGNOSIS — I10 Essential (primary) hypertension: Secondary | ICD-10-CM | POA: Insufficient documentation

## 2011-09-03 DIAGNOSIS — I4891 Unspecified atrial fibrillation: Secondary | ICD-10-CM

## 2011-09-03 DIAGNOSIS — I059 Rheumatic mitral valve disease, unspecified: Secondary | ICD-10-CM | POA: Insufficient documentation

## 2011-09-03 DIAGNOSIS — I079 Rheumatic tricuspid valve disease, unspecified: Secondary | ICD-10-CM | POA: Insufficient documentation

## 2011-09-03 DIAGNOSIS — E785 Hyperlipidemia, unspecified: Secondary | ICD-10-CM | POA: Insufficient documentation

## 2011-10-07 ENCOUNTER — Ambulatory Visit: Payer: BC Managed Care – PPO | Admitting: Internal Medicine

## 2011-10-09 ENCOUNTER — Ambulatory Visit (INDEPENDENT_AMBULATORY_CARE_PROVIDER_SITE_OTHER): Payer: BC Managed Care – PPO | Admitting: Internal Medicine

## 2011-10-09 ENCOUNTER — Encounter: Payer: Self-pay | Admitting: Internal Medicine

## 2011-10-09 VITALS — BP 123/66 | HR 72 | Resp 18 | Ht 74.0 in | Wt 268.0 lb

## 2011-10-09 DIAGNOSIS — I4891 Unspecified atrial fibrillation: Secondary | ICD-10-CM

## 2011-10-09 DIAGNOSIS — I4892 Unspecified atrial flutter: Secondary | ICD-10-CM

## 2011-10-09 NOTE — Patient Instructions (Signed)
Your physician wants you to follow-up in: 6 months with Dr. Allred. You will receive a reminder letter in the mail two months in advance. If you don't receive a letter, please call our office to schedule the follow-up appointment.  

## 2011-10-09 NOTE — Progress Notes (Signed)
The patient presents today for routine electrophysiology followup.  Since last being seen in our clinic, the patient reports doing very well.  He remains active. He feels well and reports good exercise tolerance.  He denies fatigue. He feels that he is in sinus rhythm most of the time.  Today, he denies symptoms of chest pain, shortness of breath, orthopnea, PND, lower extremity edema, dizziness, presyncope, syncope, or neurologic sequela.  The patient feels that he is tolerating medications without difficulties and is otherwise without complaint today.   Past Medical History  Diagnosis Date  . Overweight   . Coronary artery disease     PCI LAD with BMS 9/09  . Persistent atrial fibrillation   . Hypertension   . Hyperlipidemia   . Syncope and collapse 03/29/2010  . Atrial flutter    Past Surgical History  Procedure Date  . Cardiac catheterization 9/09    PCI LAD (BMS)  . Tonsillectomy   . Nasal septal surgery   . Wisdom tooth extraction     Current Outpatient Prescriptions  Medication Sig Dispense Refill  . atorvastatin (LIPITOR) 20 MG tablet Take 20 mg by mouth daily.        . cholecalciferol (VITAMIN D) 1000 UNITS tablet Take 1,000 Units by mouth daily.        Marland Kitchen lisinopril (PRINIVIL,ZESTRIL) 10 MG tablet TAKE 1 TABLET BY MOUTH EVERY DAY  30 tablet  11  . metoprolol (TOPROL-XL) 100 MG 24 hr tablet Take 100 mg by mouth 2 (two) times daily.        Marland Kitchen warfarin (COUMADIN) 5 MG tablet Take 5 mg by mouth daily. Use as directed by anticoagulation clinic         Allergies  Allergen Reactions  . Penicillins     History   Social History  . Marital Status: Single    Spouse Name: N/A    Number of Children: N/A  . Years of Education: N/A   Occupational History  . Not on file.   Social History Main Topics  . Smoking status: Former Smoker -- 1.0 packs/day for 30 years    Types: Cigarettes    Quit date: 08/26/1996  . Smokeless tobacco: Never Used  . Alcohol Use: Yes     previously  heavy, now trying to cut back  . Drug Use: No  . Sexually Active: Not on file   Other Topics Concern  . Not on file   Social History Narrative  . No narrative on file    Family History  Problem Relation Age of Onset  . Diabetes Father   . Heart attack Maternal Uncle     early 27's     Physical Exam: Filed Vitals:   10/09/11 1558  BP: 123/66  Pulse: 72  Resp: 18  Height: 6\' 2"  (1.88 m)  Weight: 268 lb (121.564 kg)    GEN- The patient is well appearing, alert and oriented x 3 today.   Head- normocephalic, atraumatic Eyes-  Sclera clear, conjunctiva pink Ears- hearing intact Oropharynx- clear Neck- supple, no JVP Lymph- no cervical lymphadenopathy Lungs- Clear to ausculation bilaterally, normal work of breathing Heart- regular rate and rhythm, no murmurs, rubs or gallops, PMI not laterally displaced GI- soft, NT, ND, + BS Extremities- no clubbing, cyanosis, or edema MS- no significant deformity or atrophy Skin- no rash or lesion Psych- euthymic mood, full affect Neuro- strength and sensation are intact  EKG today reveals sinus rhythm 52 bpm, otherwise normal ekg   Assessment  and Plan:

## 2011-10-09 NOTE — Assessment & Plan Note (Signed)
He continues to have episodic of atrial fibrillation/ atrial flutter but does not wish to start AAD at this time.  He is in sinus rhythm today. I have again offered initiation of tikosyn vs rate control.  I would avoid catheter ablation presently given his relative absence of symptoms. He is clear that he wishes to continue rate control for now. He will continue coumadin for stroke prevention.    Return in 6 months

## 2011-10-16 ENCOUNTER — Ambulatory Visit: Payer: BC Managed Care – PPO | Admitting: Internal Medicine

## 2011-10-17 NOTE — Progress Notes (Signed)
Addended by: Micki Riley C on: 10/17/2011 11:05 AM   Modules accepted: Orders

## 2011-11-18 ENCOUNTER — Other Ambulatory Visit: Payer: Self-pay | Admitting: Internal Medicine

## 2011-11-18 MED ORDER — METOPROLOL SUCCINATE ER 100 MG PO TB24: 100.0000 mg | ORAL_TABLET | Freq: Two times a day (BID) | ORAL | Status: DC

## 2011-11-20 ENCOUNTER — Other Ambulatory Visit: Payer: Self-pay | Admitting: *Deleted

## 2011-11-27 ENCOUNTER — Other Ambulatory Visit: Payer: Self-pay | Admitting: Internal Medicine

## 2011-11-27 MED ORDER — METOPROLOL SUCCINATE ER 100 MG PO TB24: 100.0000 mg | ORAL_TABLET | Freq: Two times a day (BID) | ORAL | Status: DC

## 2011-11-27 NOTE — Telephone Encounter (Signed)
New Msg: Pt stating that he has been down to the RX several times to get this RX with no success. Pt needs 90 day supply of RX called in.

## 2012-06-18 ENCOUNTER — Ambulatory Visit: Payer: BC Managed Care – PPO | Admitting: Internal Medicine

## 2012-06-28 ENCOUNTER — Ambulatory Visit (INDEPENDENT_AMBULATORY_CARE_PROVIDER_SITE_OTHER): Payer: BC Managed Care – PPO | Admitting: Internal Medicine

## 2012-06-28 ENCOUNTER — Encounter: Payer: Self-pay | Admitting: Internal Medicine

## 2012-06-28 VITALS — BP 153/89 | HR 72 | Ht 73.5 in | Wt 259.1 lb

## 2012-06-28 DIAGNOSIS — I4891 Unspecified atrial fibrillation: Secondary | ICD-10-CM

## 2012-06-28 DIAGNOSIS — E785 Hyperlipidemia, unspecified: Secondary | ICD-10-CM

## 2012-06-28 DIAGNOSIS — I10 Essential (primary) hypertension: Secondary | ICD-10-CM

## 2012-06-28 LAB — BASIC METABOLIC PANEL
BUN: 10 mg/dL (ref 6–23)
CO2: 26 mEq/L (ref 19–32)
Calcium: 9.2 mg/dL (ref 8.4–10.5)
Chloride: 106 mEq/L (ref 96–112)
Creatinine, Ser: 1 mg/dL (ref 0.4–1.5)
GFR: 85.7 mL/min (ref >60.00)
Glucose, Bld: 94 mg/dL (ref 70–99)
Potassium: 4.2 mEq/L (ref 3.5–5.1)
Sodium: 140 mEq/L (ref 135–145)

## 2012-06-28 LAB — HEPATIC FUNCTION PANEL
ALT: 18 U/L (ref 0–53)
AST: 23 U/L (ref 0–37)
Albumin: 4.2 g/dL (ref 3.5–5.2)
Alkaline Phosphatase: 55 U/L (ref 39–117)
Bilirubin, Direct: 0.2 mg/dL (ref 0.0–0.3)
Total Bilirubin: 1.4 mg/dL — ABNORMAL HIGH (ref 0.3–1.2)
Total Protein: 7.3 g/dL (ref 6.0–8.3)

## 2012-06-28 MED ORDER — LISINOPRIL 20 MG PO TABS: 20.0000 mg | ORAL_TABLET | Freq: Every day | ORAL | Status: DC

## 2012-06-28 NOTE — Patient Instructions (Addendum)
Your physician wants you to follow-up in: You will receive a reminder letter in the mail two months in advance. If you don't receive a letter, please call our office to schedule the follow-up appointment.  Your physician has recommended you make the following change in your medication:  Increase Lisinopril to 20 mg by mouth daily  Have blood pressure check and lab work (basic metabolic profile) done in 1 month at your primary care office   Follow a 2 gram Sodium diet   2 Gram Low Sodium Diet A 2 gram sodium diet restricts the amount of sodium in the diet to no more than 2 g or 2000 mg daily. Limiting the amount of sodium is often used to help lower blood pressure. It is important if you have heart, liver, or kidney problems. Many foods contain sodium for flavor and sometimes as a preservative. When the amount of sodium in a diet needs to be low, it is important to know what to look for when choosing foods and drinks. The following includes some information and guidelines to help make it easier for you to adapt to a low sodium diet. QUICK TIPS  Do not add salt to food.   Avoid convenience items and fast food.   Choose unsalted snack foods.   Buy lower sodium products, often labeled as "lower sodium" or "no salt added."   Check food labels to learn how much sodium is in 1 serving.   When eating at a restaurant, ask that your food be prepared with less salt or none, if possible.  READING FOOD LABELS FOR SODIUM INFORMATION The nutrition facts label is a good place to find how much sodium is in foods. Look for products with no more than 500 to 600 mg of sodium per meal and no more than 150 mg per serving. Remember that 2 g = 2000 mg. The food label may also list foods as:  Sodium-free: Less than 5 mg in a serving.   Very low sodium: 35 mg or less in a serving.   Low-sodium: 140 mg or less in a serving.   Light in sodium: 50% less sodium in a serving. For example, if a food that usually  has 300 mg of sodium is changed to become light in sodium, it will have 150 mg of sodium.   Reduced sodium: 25% less sodium in a serving. For example, if a food that usually has 400 mg of sodium is changed to reduced sodium, it will have 300 mg of sodium.  CHOOSING FOODS Grains  Avoid: Salted crackers and snack items. Some cereals, including instant hot cereals. Bread stuffing and biscuit mixes. Seasoned rice or pasta mixes.   Choose: Unsalted snack items. Low-sodium cereals, oats, puffed wheat and rice, shredded wheat. English muffins and bread. Pasta.  Meats  Avoid: Salted, canned, smoked, spiced, pickled meats, including fish and poultry. Bacon, ham, sausage, cold cuts, hot dogs, anchovies.   Choose: Low-sodium canned tuna and salmon. Fresh or frozen meat, poultry, and fish.  Dairy  Avoid: Processed cheese and spreads. Cottage cheese. Buttermilk and condensed milk. Regular cheese.   Choose: Milk. Low-sodium cottage cheese. Yogurt. Sour cream. Low-sodium cheese.  Fruits and Vegetables  Avoid: Regular canned vegetables. Regular canned tomato sauce and paste. Frozen vegetables in sauces. Olives. Rosita Fire. Relishes. Sauerkraut.   Choose: Low-sodium canned vegetables. Low-sodium tomato sauce and paste. Frozen or fresh vegetables. Fresh and frozen fruit.  Condiments  Avoid: Canned and packaged gravies. Worcestershire sauce. Tartar sauce. Barbecue  sauce. Soy sauce. Steak sauce. Ketchup. Onion, garlic, and table salt. Meat flavorings and tenderizers.   Choose: Fresh and dried herbs and spices. Low-sodium varieties of mustard and ketchup. Lemon juice. Tabasco sauce. Horseradish.  SAMPLE 2 GRAM SODIUM MEAL PLAN Breakfast / Sodium (mg)  1 cup low-fat milk / 143 mg   2 slices whole-wheat toast / 270 mg   1 tbs heart-healthy margarine / 153 mg   1 hard-boiled egg / 139 mg   1 small orange / 0 mg  Lunch / Sodium (mg)  1 cup raw carrots / 76 mg    cup hummus / 298 mg   1 cup  low-fat milk / 143 mg    cup red grapes / 2 mg   1 whole-wheat pita bread / 356 mg  Dinner / Sodium (mg)  1 cup whole-wheat pasta / 2 mg   1 cup low-sodium tomato sauce / 73 mg   3 oz lean ground beef / 57 mg   1 small side salad (1 cup raw spinach leaves,  cup cucumber,  cup yellow bell pepper) with 1 tsp olive oil and 1 tsp red wine vinegar / 25 mg  Snack / Sodium (mg)  1 container low-fat vanilla yogurt / 107 mg   3 graham cracker squares / 127 mg  Nutrient Analysis  Calories: 2033   Protein: 77 g   Carbohydrate: 282 g   Fat: 72 g   Sodium: 1971 mg  Document Released: 10/06/2005 Document Revised: 09/25/2011 Document Reviewed: 01/07/2010 [facility] Patient Information 2012 [place], [place].

## 2012-06-28 NOTE — Assessment & Plan Note (Signed)
He continues to have episodic of atrial fibrillation/ atrial flutter but does not wish to start AAD at this time.  Due to a relative paucity of symptoms, we will continue rate control Recent echo reveals preserved EF. He will continue coumadin for stroke prevention.    Return in 12 months

## 2012-06-28 NOTE — Assessment & Plan Note (Signed)
He wishes to have his lipids obtained at work and will have them forwarded to [place]

## 2012-06-28 NOTE — Assessment & Plan Note (Signed)
Above goal today 2 gram sodium diet  Increase lisinopril to 20mg  daily today Follow-up with PCP for BP check and BMET in 4-6 weeks

## 2012-06-28 NOTE — Addendum Note (Signed)
Addended by: Dossie Arbour on: 06/28/2012 12:53 PM   Modules accepted: Orders

## 2012-06-28 NOTE — Progress Notes (Signed)
PCP: Barkley Bruns, MD  The patient presents today for routine electrophysiology followup.  Since last being seen in our clinic, the patient reports doing very well.  Arthur Rich remains active. Arthur Rich feels well and reports good exercise tolerance.  Today, Arthur Rich denies symptoms of chest pain, shortness of breath, orthopnea, PND, lower extremity edema, dizziness, presyncope, syncope, or neurologic sequela.  The patient feels that Arthur Rich is tolerating medications without difficulties and is otherwise without complaint today.   Past Medical History  Diagnosis Date  . Overweight   . Coronary artery disease     PCI LAD with BMS 9/09  . Persistent atrial fibrillation   . Hypertension   . Hyperlipidemia   . Syncope and collapse 03/29/2010  . Atrial flutter    Past Surgical History  Procedure Date  . Cardiac catheterization 9/09    PCI LAD (BMS)  . Tonsillectomy   . Nasal septal surgery   . Wisdom tooth extraction     Current Outpatient Prescriptions  Medication Sig Dispense Refill  . atorvastatin (LIPITOR) 20 MG tablet Take 20 mg by mouth daily.        . cholecalciferol (VITAMIN D) 1000 UNITS tablet Take 1,000 Units by mouth daily.        Marland Kitchen lisinopril (PRINIVIL,ZESTRIL) 10 MG tablet TAKE 1 TABLET BY MOUTH EVERY DAY  30 tablet  11  . metoprolol succinate (TOPROL-XL) 100 MG 24 hr tablet Take 1 tablet (100 mg total) by mouth 2 (two) times daily.  180 tablet  3  . warfarin (COUMADIN) 5 MG tablet Take 5 mg by mouth daily. 7.5mg  Monday , Wednesday and Friday, Use as directed by anticoagulation clinic        Allergies  Allergen Reactions  . Penicillins     History   Social History  . Marital Status: Single    Spouse Name: N/A    Number of Children: N/A  . Years of Education: N/A   Occupational History  . Not on file.   Social History Main Topics  . Smoking status: Former Smoker -- 1.0 packs/day for 30 years    Types: Cigarettes    Quit date: 08/26/1996  . Smokeless tobacco: Never Used  .  Alcohol Use: Yes     previously heavy, now trying to cut back  . Drug Use: No  . Sexually Active: Not on file   Other Topics Concern  . Not on file   Social History Narrative  . No narrative on file    Family History  Problem Relation Age of Onset  . Diabetes Father   . Heart attack Maternal Uncle     early 40's     Physical Exam: Filed Vitals:   06/28/12 1218  BP: 153/89  Pulse: 72  Height: 6' 1.5" (1.867 m)  Weight: 259 lb 1.9 oz (117.536 kg)    GEN- The patient is well appearing, alert and oriented x 3 today.   Head- normocephalic, atraumatic Eyes-  Sclera clear, conjunctiva pink Ears- hearing intact Oropharynx- clear Neck- supple, no JVP Lymph- no cervical lymphadenopathy Lungs- Clear to ausculation bilaterally, normal work of breathing Heart- irregular rate and rhythm, no murmurs, rubs or gallops, PMI not laterally displaced GI- soft, NT, ND, + BS Extremities- no clubbing, cyanosis, or edema  EKG today reveals afib, V rate 90 bpm   Assessment and Plan:

## 2012-09-08 ENCOUNTER — Encounter (INDEPENDENT_AMBULATORY_CARE_PROVIDER_SITE_OTHER): Payer: Self-pay | Admitting: Surgery

## 2012-09-20 ENCOUNTER — Ambulatory Visit (INDEPENDENT_AMBULATORY_CARE_PROVIDER_SITE_OTHER): Payer: Self-pay | Admitting: Surgery

## 2012-10-05 ENCOUNTER — Encounter (INDEPENDENT_AMBULATORY_CARE_PROVIDER_SITE_OTHER): Payer: Self-pay | Admitting: Surgery

## 2012-10-05 ENCOUNTER — Ambulatory Visit (INDEPENDENT_AMBULATORY_CARE_PROVIDER_SITE_OTHER): Payer: BC Managed Care – PPO | Admitting: Surgery

## 2012-10-05 VITALS — BP 142/80 | HR 72 | Temp 97.5°F | Resp 24 | Ht 73.0 in | Wt 254.6 lb

## 2012-10-05 DIAGNOSIS — K429 Umbilical hernia without obstruction or gangrene: Secondary | ICD-10-CM

## 2012-10-05 NOTE — Progress Notes (Signed)
Patient ID: Arthur Rich, male   DOB: 03-Nov-1950, 61 y.o.   MRN: [id number]  Chief Complaint  Patient presents with  . Umbilical Hernia    HPI Arthur Rich is a 61 y.o. male.   HPI This is a very pleasant gentleman referred by Dr. Quentin Rich for a large umbilical hernia. He reports he has had it for many years and it has never caused any discomfort. He has had no further symptoms. He is here to discuss repair as he is on chronic Coumadin. Past Medical History  Diagnosis Date  . Overweight   . Coronary artery disease     PCI LAD with BMS 9/09  . Persistent atrial fibrillation   . Hypertension   . Hyperlipidemia   . Syncope and collapse 03/29/2010  . Atrial flutter   . Cancer     skin    Past Surgical History  Procedure Date  . Cardiac catheterization 9/09    PCI LAD (BMS)  . Tonsillectomy   . Nasal septal surgery   . Wisdom tooth extraction     Family History  Problem Relation Age of Onset  . Diabetes Father   . Heart disease Father   . Heart attack Maternal Uncle     early 50's  . Cancer Maternal Grandmother     unknown ?leukemia    Social History History  Substance Use Topics  . Smoking status: Former Smoker -- 1.0 packs/day for 30 years    Types: Cigarettes    Quit date: 08/26/1996  . Smokeless tobacco: Never Used  . Alcohol Use: Yes     Comment: previously heavy, now trying to cut back    Allergies  Allergen Reactions  . Penicillins     Current Outpatient Prescriptions  Medication Sig Dispense Refill  . atorvastatin (LIPITOR) 20 MG tablet Take 20 mg by mouth daily.        . cholecalciferol (VITAMIN D) 1000 UNITS tablet Take 1,000 Units by mouth daily.        Marland Kitchen lisinopril (PRINIVIL,ZESTRIL) 20 MG tablet Take 1 tablet (20 mg total) by mouth daily.  30 tablet  11  . metoprolol succinate (TOPROL-XL) 100 MG 24 hr tablet Take 1 tablet (100 mg total) by mouth 2 (two) times daily.  180 tablet  3  . warfarin (COUMADIN) 5 MG tablet Take 5 mg by mouth  daily. 7.5mg  Monday , Wednesday and Friday, Use as directed by anticoagulation clinic        Review of Systems Review of Systems  Constitutional: Negative for fever, chills and unexpected weight change.  HENT: Negative for hearing loss, congestion, sore throat, trouble swallowing and voice change.   Eyes: Negative for visual disturbance.  Respiratory: Negative for cough and wheezing.   Cardiovascular: Positive for palpitations. Negative for chest pain and leg swelling.  Gastrointestinal: Negative for nausea, vomiting, abdominal pain, diarrhea, constipation, blood in stool, abdominal distention, anal bleeding and rectal pain.  Genitourinary: Negative for hematuria and difficulty urinating.  Musculoskeletal: Negative for arthralgias.  Skin: Negative for rash and wound.  Neurological: Negative for seizures, syncope, weakness and headaches.  Hematological: Negative for adenopathy. Does not bruise/bleed easily.  Psychiatric/Behavioral: Negative for confusion.    Blood pressure 142/80, pulse 72, temperature 97.5 F (36.4 C), temperature source Temporal, resp. rate 24, height 6\' 1"  (1.854 m), weight 254 lb 9.6 oz (115.486 kg).  Physical Exam Physical Exam  Constitutional: He is oriented to person, place, and time. He appears well-developed and well-nourished. No distress.  HENT:  Head: Normocephalic and atraumatic.  Right Ear: External ear normal.  Left Ear: External ear normal.  Nose: Nose normal.  Mouth/Throat: Oropharynx is clear and moist. No oropharyngeal exudate.  Eyes: Conjunctivae normal are normal. Pupils are equal, round, and reactive to light. Right eye exhibits no discharge. Left eye exhibits no discharge. No scleral icterus.  Neck: Normal range of motion. Neck supple. No tracheal deviation present. No thyromegaly present.  Cardiovascular: Normal rate and intact distal pulses.   Murmur heard.      Irregular rhythm  Pulmonary/Chest: Effort normal and breath sounds normal. No  respiratory distress. He has no wheezes.  Abdominal: Soft. Bowel sounds are normal. He exhibits no distension. There is no tenderness. There is no rebound.       There is a very large but reducible, nontender umbilical hernia  Musculoskeletal: Normal range of motion. He exhibits no edema and no tenderness.  Lymphadenopathy:    He has no cervical adenopathy.  Neurological: He is alert and oriented to person, place, and time.  Skin: Skin is warm and dry. He is not diaphoretic.  Psychiatric: His behavior is normal.    Data Reviewed   Assessment    Umbilical hernia    Plan    As the hernia is quite large and he is on chronic anticoagulation, repair is recommended with mesh. I discussed with the laparoscopic and open repairs. After discussion, we have agreed to proceed with open repair with mesh. I discussed the risk of surgery which include but not limited to bleeding, infection, recurrence, injury to surrounding structures, need to come off his anticoagulation for 5 days preop, et Karie Soda. He understands and will call back to schedule surgery.       Rich,Arthur A 10/05/2012, 10:46 AM

## 2012-10-07 ENCOUNTER — Encounter (INDEPENDENT_AMBULATORY_CARE_PROVIDER_SITE_OTHER): Payer: Self-pay

## 2012-11-04 ENCOUNTER — Ambulatory Visit: Admit: 2012-11-04 | Payer: Self-pay | Admitting: Surgery

## 2012-11-04 SURGERY — REPAIR, HERNIA, UMBILICAL, ADULT
Anesthesia: General

## 2013-01-03 ENCOUNTER — Other Ambulatory Visit: Payer: Self-pay | Admitting: Emergency Medicine

## 2013-01-03 MED ORDER — METOPROLOL SUCCINATE ER 100 MG PO TB24: 100.0000 mg | ORAL_TABLET | Freq: Two times a day (BID) | ORAL | Status: DC

## 2013-05-03 LAB — PROTIME-INR

## 2013-06-14 LAB — PROTIME-INR

## 2013-06-29 ENCOUNTER — Ambulatory Visit: Payer: BC Managed Care – PPO | Admitting: Internal Medicine

## 2013-07-01 ENCOUNTER — Other Ambulatory Visit: Payer: Self-pay

## 2013-07-01 ENCOUNTER — Ambulatory Visit: Payer: BC Managed Care – PPO | Admitting: Internal Medicine

## 2013-07-01 ENCOUNTER — Telehealth: Payer: Self-pay | Admitting: Internal Medicine

## 2013-07-01 DIAGNOSIS — I10 Essential (primary) hypertension: Secondary | ICD-10-CM

## 2013-07-01 MED ORDER — ATORVASTATIN CALCIUM 20 MG PO TABS: 20.0000 mg | ORAL_TABLET | Freq: Every day | ORAL | Status: DC

## 2013-07-01 MED ORDER — LISINOPRIL 20 MG PO TABS: 20.0000 mg | ORAL_TABLET | Freq: Every day | ORAL | Status: DC

## 2013-07-01 MED ORDER — VITAMIN D 1000 UNITS PO TABS: 1000.0000 [IU] | ORAL_TABLET | Freq: Every day | ORAL | Status: DC

## 2013-07-01 MED ORDER — METOPROLOL SUCCINATE ER 100 MG PO TB24: 100.0000 mg | ORAL_TABLET | Freq: Two times a day (BID) | ORAL | Status: DC

## 2013-07-01 NOTE — Telephone Encounter (Signed)
New problem     Patient was told by [facility] office to contact  Dr. Katrinka Blazing office regarding his medication. Patient was  Last seen in the  office visit was  [id number]. Nurse is saying she doesn't know who refill his medication.

## 2013-07-01 NOTE — Telephone Encounter (Signed)
His medications were filled and he has a follow up appointment

## 2013-07-06 ENCOUNTER — Encounter: Payer: Self-pay | Admitting: Internal Medicine

## 2013-07-20 ENCOUNTER — Ambulatory Visit (INDEPENDENT_AMBULATORY_CARE_PROVIDER_SITE_OTHER): Payer: BC Managed Care – PPO | Admitting: Internal Medicine

## 2013-07-20 VITALS — BP 150/90 | HR 103 | Ht 73.0 in | Wt 258.0 lb

## 2013-07-20 DIAGNOSIS — Z7901 Long term (current) use of anticoagulants: Secondary | ICD-10-CM | POA: Insufficient documentation

## 2013-07-20 DIAGNOSIS — I4891 Unspecified atrial fibrillation: Secondary | ICD-10-CM

## 2013-07-20 DIAGNOSIS — I10 Essential (primary) hypertension: Secondary | ICD-10-CM

## 2013-07-20 MED ORDER — DABIGATRAN ETEXILATE MESYLATE 150 MG PO CAPS: 150.0000 mg | ORAL_CAPSULE | Freq: Two times a day (BID) | ORAL | Status: DC

## 2013-07-20 NOTE — Patient Instructions (Addendum)
Your physician recommends that you schedule a follow-up appointment in: 6 weeks with Norma Fredrickson, NP and 6 months with Dr Johney Frame  Your physician has requested that you have an echocardiogram. Echocardiography is a painless test that uses sound waves to create images of your heart. It provides your doctor with information about the size and shape of your heart and how well your heart's chambers and valves are working. This procedure takes approximately one hour. There are no restrictions for this procedure.  Your physician recommends that you return for lab work same day as echo  CBC/BMP/INR  Your physician has recommended you make the following change in your medication:  1) Stop Coumadin 3 days prior to echo 2) Start Pradaxa 150mg  twice daily when INR is less than 2.0

## 2013-07-20 NOTE — Progress Notes (Signed)
PCP: Barkley Bruns, MD, recently seen by Dr Cleda Clarks  The patient presents today for routine electrophysiology followup.  Since last being seen in our clinic, the patient reports doing well.  He remains active. He feels well and reports good exercise tolerance.  He reports that his HR and BP are well controlled at home.  Today, he denies symptoms of chest pain, shortness of breath, orthopnea, PND, lower extremity edema, dizziness, presyncope, syncope, or neurologic sequela.  The patient feels that he is tolerating medications without difficulties and is otherwise without complaint today.   Past Medical History  Diagnosis Date  . Overweight   . Coronary artery disease     PCI LAD with BMS 9/09  . Persistent atrial fibrillation   . Hypertension   . Hyperlipidemia   . Syncope and collapse 03/29/2010  . Atrial flutter   . Cancer     skin   Past Surgical History  Procedure Laterality Date  . Cardiac catheterization  9/09    PCI LAD (BMS)  . Tonsillectomy    . Nasal septal surgery    . Wisdom tooth extraction      Current Outpatient Prescriptions  Medication Sig Dispense Refill  . atorvastatin (LIPITOR) 20 MG tablet Take 40 mg by mouth daily.      . cholecalciferol (VITAMIN D) 1000 UNITS tablet Take 1 tablet (1,000 Units total) by mouth daily.  30 tablet  3  . lisinopril (PRINIVIL,ZESTRIL) 20 MG tablet Take 40 mg by mouth daily.      . metoprolol succinate (TOPROL-XL) 100 MG 24 hr tablet Take 1 tablet (100 mg total) by mouth 2 (two) times daily.  180 tablet  1  . dabigatran (PRADAXA) 150 MG CAPS capsule Take 1 capsule (150 mg total) by mouth every 12 (twelve) hours.  60 capsule  11   No current facility-administered medications for this visit.    Allergies  Allergen Reactions  . Penicillins     History   Social History  . Marital Status: Single    Spouse Name: N/A    Number of Children: N/A  . Years of Education: N/A   Occupational History  . Not on file.   Social  History Main Topics  . Smoking status: Former Smoker -- 1.00 packs/day for 30 years    Types: Cigarettes    Quit date: 08/26/1996  . Smokeless tobacco: Never Used  . Alcohol Use: Yes     Comment: previously heavy, now trying to cut back  . Drug Use: No  . Sexual Activity: Not on file   Other Topics Concern  . Not on file   Social History Narrative  . No narrative on file    Family History  Problem Relation Age of Onset  . Diabetes Father   . Heart disease Father   . Heart attack Maternal Uncle     early 75's  . Cancer Maternal Grandmother     unknown ?leukemia     Physical Exam: Filed Vitals:   07/20/13 1647  BP: 150/90  Pulse: 103  Height: 6\' 1"  (1.854 m)  Weight: 258 lb (117.028 kg)    GEN- The patient is well appearing, alert and oriented x 3 today.   Head- normocephalic, atraumatic Eyes-  Sclera clear, conjunctiva pink Ears- hearing intact Oropharynx- clear Neck- supple, no JVP Lymph- no cervical lymphadenopathy Lungs- Clear to ausculation bilaterally, normal work of breathing Heart- irregular rate and rhythm, no murmurs, rubs or gallops, PMI not laterally displaced GI- soft, NT,  ND, + BS Extremities- no clubbing, cyanosis, or edema  EKG today reveals afib, V rate 103 bpm, nonspecific ST/T changes   Assessment and Plan:  1. afib Rates appear stable Home records document heart rates are predominantly < 100 Repeat echo  2. HTN Repeat by MD is 148/80 BP recordings from home are better controlled He will continue to monitor his BP closely Consider adding spironolactone if BP remains elevated  3. Anticoagulation Today, I discussed coumadin and novel anticoagulants including pradaxa, xarelto, and eliquis today as indicated for risk reduction in stroke and systemic emboli with nonvalvular atrial fibrillation.  Risks, benefits, and alternatives to each of these drugs were discussed at length today.  He would prefer to start pradaxa.  He will stop coumadin  and start pradaxa once INR < 2 Will obtain a bmet and CBC  He will follow-up with Norma Fredrickson in 6 weeks I will see in a year

## 2013-07-21 ENCOUNTER — Telehealth: Payer: Self-pay

## 2013-07-21 ENCOUNTER — Other Ambulatory Visit: Payer: Self-pay

## 2013-07-21 MED ORDER — ATORVASTATIN CALCIUM 20 MG PO TABS: 40.0000 mg | ORAL_TABLET | Freq: Every day | ORAL | Status: DC

## 2013-07-21 NOTE — Telephone Encounter (Signed)
Called pham to see if patients liptor was ready it was and i called the patient to let him it was filled

## 2013-07-27 ENCOUNTER — Ambulatory Visit: Payer: BC Managed Care – PPO | Admitting: Internal Medicine

## 2013-08-05 ENCOUNTER — Ambulatory Visit (INDEPENDENT_AMBULATORY_CARE_PROVIDER_SITE_OTHER): Payer: BC Managed Care – PPO | Admitting: Internal Medicine

## 2013-08-05 ENCOUNTER — Ambulatory Visit (INDEPENDENT_AMBULATORY_CARE_PROVIDER_SITE_OTHER): Payer: BC Managed Care – PPO | Admitting: Pharmacist

## 2013-08-05 ENCOUNTER — Ambulatory Visit (HOSPITAL_COMMUNITY): Payer: BC Managed Care – PPO | Attending: Internal Medicine

## 2013-08-05 DIAGNOSIS — I079 Rheumatic tricuspid valve disease, unspecified: Secondary | ICD-10-CM | POA: Insufficient documentation

## 2013-08-05 DIAGNOSIS — I4891 Unspecified atrial fibrillation: Secondary | ICD-10-CM

## 2013-08-05 DIAGNOSIS — E785 Hyperlipidemia, unspecified: Secondary | ICD-10-CM | POA: Insufficient documentation

## 2013-08-05 DIAGNOSIS — Z87891 Personal history of nicotine dependence: Secondary | ICD-10-CM | POA: Insufficient documentation

## 2013-08-05 DIAGNOSIS — I10 Essential (primary) hypertension: Secondary | ICD-10-CM

## 2013-08-05 DIAGNOSIS — I2510 Atherosclerotic heart disease of native coronary artery without angina pectoris: Secondary | ICD-10-CM | POA: Insufficient documentation

## 2013-08-05 DIAGNOSIS — I059 Rheumatic mitral valve disease, unspecified: Secondary | ICD-10-CM | POA: Insufficient documentation

## 2013-08-05 LAB — BASIC METABOLIC PANEL
BUN: 10 mg/dL (ref 6–23)
CO2: 29 mEq/L (ref 19–32)
Calcium: 9.4 mg/dL (ref 8.4–10.5)
Chloride: 106 mEq/L (ref 96–112)
Creatinine, Ser: 1 mg/dL (ref 0.4–1.5)
GFR: 82.38 mL/min (ref >60.00)
Glucose, Bld: 119 mg/dL — ABNORMAL HIGH (ref 70–99)
Potassium: 4.6 mEq/L (ref 3.5–5.1)
Sodium: 143 mEq/L (ref 135–145)

## 2013-08-05 LAB — PROTIME-INR
INR: 1.5 ratio — ABNORMAL HIGH (ref 0.8–1.0)
Prothrombin Time: 16.1 s — ABNORMAL HIGH (ref 10.2–12.4)

## 2013-08-05 LAB — CBC
HCT: 41.8 % (ref 39.0–52.0)
Hemoglobin: 14.3 g/dL (ref 13.0–17.0)
MCHC: 34.2 g/dL (ref 30.0–36.0)
MCV: 89.7 fl (ref 78.0–100.0)
Platelets: 197 10*3/uL (ref 150.0–400.0)
RBC: 4.66 Mil/uL (ref 4.22–5.81)
RDW: 13.3 % (ref 11.5–14.6)
WBC: 5.8 10*3/uL (ref 4.5–10.5)

## 2013-08-05 LAB — POCT INR: INR: 1.5

## 2013-08-05 NOTE — Progress Notes (Signed)
Echocardiogram performed.  

## 2013-08-10 ENCOUNTER — Encounter: Payer: Self-pay | Admitting: Internal Medicine

## 2013-08-11 NOTE — Telephone Encounter (Signed)
Patient walked in requesting copies of labs and echo.  I printed lab and echo results for patient who is on the way to see an endocrinologist and reviewed with him the results.  I advised patient that lab results have not been reviewed by a physician but preliminary results show all results WNL.  Patient verbalized understanding and thanked me for my help.

## 2013-08-24 ENCOUNTER — Telehealth: Payer: Self-pay | Admitting: Nurse Practitioner

## 2013-08-24 NOTE — Telephone Encounter (Signed)
S/w pt is aware needs to keep appt due to checking on pts status of pradaxa, bp and titrate medications pt is agreeable to plan change pt's appt to the following week due to conflict

## 2013-08-24 NOTE — Telephone Encounter (Signed)
New problem    10/23 pt spoke to NP about this appt and Echo results.   Pt would like to Know if he should keep 11/10 appt. Give him a call

## 2013-08-25 ENCOUNTER — Other Ambulatory Visit: Payer: Self-pay

## 2013-08-29 ENCOUNTER — Ambulatory Visit: Payer: BC Managed Care – PPO | Admitting: Nurse Practitioner

## 2013-09-06 ENCOUNTER — Encounter: Payer: Self-pay | Admitting: Nurse Practitioner

## 2013-09-06 ENCOUNTER — Ambulatory Visit (INDEPENDENT_AMBULATORY_CARE_PROVIDER_SITE_OTHER): Payer: BC Managed Care – PPO | Admitting: Nurse Practitioner

## 2013-09-06 VITALS — BP 160/110 | HR 74 | Ht 73.75 in | Wt 262.1 lb

## 2013-09-06 DIAGNOSIS — E785 Hyperlipidemia, unspecified: Secondary | ICD-10-CM

## 2013-09-06 DIAGNOSIS — I4891 Unspecified atrial fibrillation: Secondary | ICD-10-CM

## 2013-09-06 LAB — CBC WITH DIFFERENTIAL/PLATELET
Basophils Absolute: 0 10*3/uL (ref 0.0–0.1)
Basophils Relative: 0.6 % (ref 0.0–3.0)
Eosinophils Absolute: 0.1 10*3/uL (ref 0.0–0.7)
Eosinophils Relative: 1.8 % (ref 0.0–5.0)
HCT: 43.4 % (ref 39.0–52.0)
Hemoglobin: 15 g/dL (ref 13.0–17.0)
Lymphocytes Relative: 21.8 % (ref 12.0–46.0)
Lymphs Abs: 1.5 10*3/uL (ref 0.7–4.0)
MCHC: 34.6 g/dL (ref 30.0–36.0)
MCV: 88.5 fl (ref 78.0–100.0)
Monocytes Absolute: 0.7 10*3/uL (ref 0.1–1.0)
Monocytes Relative: 10 % (ref 3.0–12.0)
Neutro Abs: 4.6 10*3/uL (ref 1.4–7.7)
Neutrophils Relative %: 65.8 % (ref 43.0–77.0)
Platelets: 213 10*3/uL (ref 150.0–400.0)
RBC: 4.9 Mil/uL (ref 4.22–5.81)
RDW: 13.5 % (ref 11.5–14.6)
WBC: 6.9 10*3/uL (ref 4.5–10.5)

## 2013-09-06 LAB — BASIC METABOLIC PANEL
BUN: 12 mg/dL (ref 6–23)
CO2: 31 mEq/L (ref 19–32)
Calcium: 9.6 mg/dL (ref 8.4–10.5)
Chloride: 106 mEq/L (ref 96–112)
Creatinine, Ser: 1 mg/dL (ref 0.4–1.5)
GFR: 84.34 mL/min (ref >60.00)
Glucose, Bld: 110 mg/dL — ABNORMAL HIGH (ref 70–99)
Potassium: 4.8 mEq/L (ref 3.5–5.1)
Sodium: 140 mEq/L (ref 135–145)

## 2013-09-06 LAB — LIPID PANEL
Cholesterol: 197 mg/dL (ref 0–200)
HDL: 46 mg/dL (ref >39.00)
LDL Cholesterol: 113 mg/dL — ABNORMAL HIGH (ref 0–99)
Total CHOL/HDL Ratio: 4
Triglycerides: 191 mg/dL — ABNORMAL HIGH (ref 0.0–149.0)
VLDL: 38.2 mg/dL (ref 0.0–40.0)

## 2013-09-06 LAB — HEPATIC FUNCTION PANEL
ALT: 19 U/L (ref 0–53)
AST: 24 U/L (ref 0–37)
Albumin: 4.1 g/dL (ref 3.5–5.2)
Alkaline Phosphatase: 59 U/L (ref 39–117)
Bilirubin, Direct: 0.2 mg/dL (ref 0.0–0.3)
Total Bilirubin: 1.8 mg/dL — ABNORMAL HIGH (ref 0.3–1.2)
Total Protein: 7.5 g/dL (ref 6.0–8.3)

## 2013-09-06 NOTE — Patient Instructions (Addendum)
Continue with your current medicines  We need to check follow up labs today for your Pradaxa  Monitor your blood pressure closely for the next 2 to 3 week - then call Dr. Jenel Lucks nurse Tresa Endo with your readings - we may need to add medicine  See Dr. Johney Frame back as planned next October  Minimize your salt and keep walking!  Call the [facility] office at [phone] if you have any questions, problems or concerns.

## 2013-09-06 NOTE — Progress Notes (Signed)
Arthur Rich Date of Birth: April 30, 1951 Medical Record #[id number]  History of Present Illness: Arthur Rich is seen back today for a 6 week check. Seen for Dr. Johney Frame. He has chronic atrial fib - was on coumadin - transitioned to Pradaxa. Other issues include HTN and chronic anticoagulation.   Seen back in early October by Dr. Johney Frame - he transitioned him to Pradaxa from coumadin. Updated his echo - see below. BP was up here at his visit but he has had good control at home.   Comes back today. Here alone. Doing ok. He was late for his visit today - got some bad news about a friend earlier today - BP is up. Admits that he has gotten a little lax with checking over the past several weeks but that prior to that his readings would be in the 120's/80's. Eats out a lot - probably gets too much salt. Trying to walk for 30 minutes a day. No chest pain. Not short of breath. No awareness of his atrial fib. Feels ok on his medicines. No bleeding/bruising.  Current Outpatient Prescriptions  Medication Sig Dispense Refill  . atorvastatin (LIPITOR) 40 MG tablet Take 40 mg by mouth daily before supper.      . cholecalciferol (VITAMIN D) 1000 UNITS tablet Take 2,000 Units by mouth daily.      . dabigatran (PRADAXA) 150 MG CAPS capsule Take 1 capsule (150 mg total) by mouth every 12 (twelve) hours.  60 capsule  11  . lisinopril (PRINIVIL,ZESTRIL) 20 MG tablet Take 40 mg by mouth daily.      . metoprolol succinate (TOPROL-XL) 100 MG 24 hr tablet Take 1 tablet (100 mg total) by mouth 2 (two) times daily.  180 tablet  1   No current facility-administered medications for this visit.    Allergies  Allergen Reactions  . Penicillins     Past Medical History  Diagnosis Date  . Overweight(278.02)   . Coronary artery disease     PCI LAD with BMS 9/09  . Persistent atrial fibrillation   . Hypertension   . Hyperlipidemia   . Syncope and collapse 03/29/2010  . Atrial flutter   . Cancer     skin     Past Surgical History  Procedure Laterality Date  . Cardiac catheterization  9/09    PCI LAD (BMS)  . Tonsillectomy    . Nasal septal surgery    . Wisdom tooth extraction      History  Smoking status  . Former Smoker -- 1.00 packs/day for 30 years  . Types: Cigarettes  . Quit date: 08/26/1996  Smokeless tobacco  . Never Used    History  Alcohol Use  . Yes    Comment: previously heavy, now trying to cut back    Family History  Problem Relation Age of Onset  . Diabetes Father   . Heart disease Father   . Heart attack Maternal Uncle     early 8's  . Cancer Maternal Grandmother     unknown ?leukemia    Review of Systems: The review of systems is per the HPI.  All other systems were reviewed and are negative.  Physical Exam: BP 160/110  Pulse 74  Ht 6' 1.75" (1.873 m)  Wt 262 lb 1.9 oz (118.897 kg)  BMI 33.89 kg/m2  SpO2 99% Patient is very pleasant and in no acute distress. Skin is warm and dry. Color is normal.  HEENT is unremarkable. Normocephalic/atraumatic. PERRL. Sclera are nonicteric. Neck  is supple. No masses. No JVD. Lungs are clear. Cardiac exam shows an irregular rhythm. Rate is ok. Abdomen is soft. Extremities are without edema. Gait and ROM are intact. No gross neurologic deficits noted.  Wt Readings from Last 3 Encounters:  09/06/13 262 lb 1.9 oz (118.897 kg)  07/20/13 258 lb (117.028 kg)  10/05/12 254 lb 9.6 oz (115.486 kg)    LABORATORY DATA:   Chemistry      Component Value Date/Time   NA 143 08/05/2013 1108   K 4.6 08/05/2013 1108   CL 106 08/05/2013 1108   CO2 29 08/05/2013 1108   BUN 10 08/05/2013 1108   CREATININE 1.0 08/05/2013 1108      Component Value Date/Time   CALCIUM 9.4 08/05/2013 1108   ALKPHOS 55 06/28/2012 1323   AST 23 06/28/2012 1323   ALT 18 06/28/2012 1323   BILITOT 1.4* 06/28/2012 1323     No results found for this basename: CHOL,  HDL,  LDLCALC,  LDLDIRECT,  TRIG,  CHOLHDL   Lab Results  Component Value Date    WBC 5.8 08/05/2013   HGB 14.3 08/05/2013   HCT 41.8 08/05/2013   MCV 89.7 08/05/2013   PLT 197.0 08/05/2013     Echo Study Conclusions  - Left ventricle: The cavity size was normal. Systolic function was normal. The estimated ejection fraction was in the range of 55% to 65%. - Mitral valve: Mild regurgitation. - Left atrium: The atrium was moderately dilated. - Pulmonary arteries: PA peak pressure: 31mm Hg (S).   Assessment / Plan: 1. Persistent atrial fib - now on Pradaxa - rate is controlled.   2. Chronic anticoagulation - needs repeat labs today for his Pradaxa.   3. HTN - BP by me is 140/100 - he wants to start monitoring again at home - will call Dr. Jenel Lucks nurse, Tresa Endo with his list of readings in the next couple of weeks. Would consider HCTZ or aldactone. Needs to restrict his salt.   4. HLD - on statin - he is fasting today - will recheck his labs.  Tentatively see him back in October of 2015 per Dr. Johney Frame. Echo was reviewed with him. No change in medicines for now.   Patient is agreeable to this plan and will call if any problems develop in the interim.   Rosalio Macadamia, RN, ANP-C [facility] [address] [place], [place]  [address]

## 2013-12-29 ENCOUNTER — Other Ambulatory Visit: Payer: Self-pay | Admitting: Internal Medicine

## 2014-06-22 ENCOUNTER — Other Ambulatory Visit: Payer: Self-pay | Admitting: Internal Medicine

## 2014-08-17 ENCOUNTER — Other Ambulatory Visit: Payer: Self-pay | Admitting: Internal Medicine

## 2014-09-19 ENCOUNTER — Other Ambulatory Visit: Payer: Self-pay | Admitting: Internal Medicine

## 2014-09-27 ENCOUNTER — Other Ambulatory Visit: Payer: Self-pay | Admitting: Internal Medicine

## 2014-10-17 ENCOUNTER — Other Ambulatory Visit: Payer: Self-pay | Admitting: Internal Medicine

## 2014-10-25 ENCOUNTER — Encounter: Payer: Self-pay | Admitting: Internal Medicine

## 2014-10-25 ENCOUNTER — Ambulatory Visit (INDEPENDENT_AMBULATORY_CARE_PROVIDER_SITE_OTHER): Payer: BLUE CROSS/BLUE SHIELD | Admitting: Internal Medicine

## 2014-10-25 VITALS — BP 148/88 | HR 83 | Ht 74.0 in | Wt 273.4 lb

## 2014-10-25 DIAGNOSIS — I2510 Atherosclerotic heart disease of native coronary artery without angina pectoris: Secondary | ICD-10-CM

## 2014-10-25 DIAGNOSIS — I4821 Permanent atrial fibrillation: Secondary | ICD-10-CM | POA: Insufficient documentation

## 2014-10-25 DIAGNOSIS — I10 Essential (primary) hypertension: Secondary | ICD-10-CM

## 2014-10-25 DIAGNOSIS — I481 Persistent atrial fibrillation: Secondary | ICD-10-CM

## 2014-10-25 DIAGNOSIS — I4819 Other persistent atrial fibrillation: Secondary | ICD-10-CM

## 2014-10-25 HISTORY — DX: Permanent atrial fibrillation: I48.21

## 2014-10-25 NOTE — Progress Notes (Signed)
Primary Cardiologist:  Dr [name]  The patient presents today for routine electrophysiology followup.  Since last being seen in our clinic, the patient reports doing well.  He remains active. He feels well and reports good exercise tolerance.  He did have an episode of profound weakness/ dizziness (without syncope) in April of this past year while playing golf.  He thinks that he "overheated".  He was seen at [facility] and released after about 45 minutes without identifiable cause.  Today, he denies symptoms of chest pain, shortness of breath, orthopnea, PND, lower extremity edema,  or neurologic sequela.  The patient feels that he is tolerating medications without difficulties and is otherwise without complaint today.   Past Medical History  Diagnosis Date  . Overweight(278.02)   . Coronary artery disease     PCI LAD with BMS 9/09  . Persistent atrial fibrillation   . Hypertension   . Hyperlipidemia   . Syncope and collapse 03/29/2010  . Atrial flutter   . Cancer     skin   Past Surgical History  Procedure Laterality Date  . Cardiac catheterization  9/09    PCI LAD (BMS)  . Tonsillectomy    . Nasal septal surgery    . Wisdom tooth extraction      Current Outpatient Prescriptions  Medication Sig Dispense Refill  . atorvastatin (LIPITOR) 40 MG tablet Take 40 mg by mouth daily before supper.    . cholecalciferol (VITAMIN D) 1000 UNITS tablet Take 2,000 Units by mouth daily.    Marland Kitchen lisinopril (PRINIVIL,ZESTRIL) 40 MG tablet Take 40 mg by mouth every morning.    . metoprolol succinate (TOPROL-XL) 100 MG 24 hr tablet TAKE 1 TABLET BY MOUTH TWICE A DAY (NEEDS APPT FOR MORE REFILLS!!) 60 tablet 0  . PRADAXA 150 MG CAPS capsule TAKE 1 CAPSULE BY MOUTH EVERY 12 HOURS 60 capsule 0   No current facility-administered medications for this visit.    Allergies  Allergen Reactions  . Penicillins     Childhood - Rash    History   Social History  . Marital Status: Single    Spouse Name: N/A   Number of Children: N/A  . Years of Education: N/A   Occupational History  . Not on file.   Social History Main Topics  . Smoking status: Former Smoker -- 1.00 packs/day for 30 years    Types: Cigarettes    Quit date: 08/26/1996  . Smokeless tobacco: Never Used  . Alcohol Use: Yes     Comment: previously heavy, now trying to cut back  . Drug Use: No  . Sexual Activity: Not on file   Other Topics Concern  . Not on file   Social History Narrative    Family History  Problem Relation Age of Onset  . Diabetes Father   . Heart disease Father   . Heart attack Maternal Uncle     early 40's  . Cancer Maternal Grandmother     unknown ?leukemia     Physical Exam: Filed Vitals:   10/25/14 1133  BP: 148/88  Pulse: 83  Height: 6\' 2"  (1.88 m)  Weight: 273 lb 6.4 oz (124.013 kg)    GEN- The patient is well appearing, alert and oriented x 3 today.   Head- normocephalic, atraumatic Eyes-  Sclera clear, conjunctiva pink Ears- hearing intact Oropharynx- clear Neck- supple, no JVP Lymph- no cervical lymphadenopathy Lungs- Clear to ausculation bilaterally, normal work of breathing Heart- irregular rate and rhythm, no murmurs, rubs or  gallops, PMI not laterally displaced GI- soft, NT, ND, + BS Extremities- no clubbing, cyanosis, or edema  EKG today reveals afib, V rate 83 bpm, nonspecific ST/T changes   Assessment and Plan:  1. afib He has done very well with rate control Continue long term anticoagulation with pradaxa Labs from [facility] are reviewed today   2. HTN Elevated today He reports good BP control at home No changes  3. CAD He has not seen Dr [name] in quite some time No ischemic symptoms Follow-up with Dr [name] in 6 months  I will see in a year

## 2014-10-25 NOTE — Patient Instructions (Addendum)
Your physician wants you to follow-up in: 6 months with Dr. [name] and 12 months with Dr. Vallery Ridge will receive a reminder letter in the mail two months in advance. If you don't receive a letter, please call our office to schedule the follow-up appointment.  Go to [facility] center on 11/03/14 at 12:00 for Healthy Lifestyle Class---- Code to parking garage is [id number]--in the conference room.  It is always the second Fri of each month at noon

## 2014-10-26 ENCOUNTER — Other Ambulatory Visit: Payer: Self-pay | Admitting: Internal Medicine

## 2014-11-13 ENCOUNTER — Other Ambulatory Visit: Payer: Self-pay | Admitting: Internal Medicine

## 2015-01-22 ENCOUNTER — Telehealth: Payer: Self-pay | Admitting: Internal Medicine

## 2015-01-22 NOTE — Telephone Encounter (Signed)
Will forward to Roderic Palau, NP to be seen this week

## 2015-01-22 NOTE — Telephone Encounter (Signed)
New Message      Pt calling stating that he is having persistent A-fib and an increase of being  lightheaded and nauseas and pt wants to know if he should make an appointment. Pt also stating that he also has never been tested for sleep apnea and thinks that it would be a good idea and would like to be referred for a sleep study. Please call back and advise.

## 2015-01-22 NOTE — Telephone Encounter (Signed)
Talked with patient - appointment made for Tuesday with Butch Penny in [facility] clinic

## 2015-01-23 ENCOUNTER — Ambulatory Visit (HOSPITAL_COMMUNITY)
Admission: RE | Admit: 2015-01-23 | Discharge: 2015-01-23 | Disposition: A | Discharge status: Home / Self Care | Payer: BLUE CROSS/BLUE SHIELD | Source: Ambulatory Visit | Attending: Nurse Practitioner | Admitting: Nurse Practitioner

## 2015-01-23 ENCOUNTER — Encounter (HOSPITAL_COMMUNITY): Payer: Self-pay | Admitting: Nurse Practitioner

## 2015-01-23 ENCOUNTER — Other Ambulatory Visit: Payer: Self-pay

## 2015-01-23 VITALS — BP 140/88 | HR 76 | Ht 73.5 in | Wt 278.2 lb

## 2015-01-23 DIAGNOSIS — I482 Chronic atrial fibrillation: Secondary | ICD-10-CM | POA: Insufficient documentation

## 2015-01-23 DIAGNOSIS — R42 Dizziness and giddiness: Secondary | ICD-10-CM | POA: Diagnosis not present

## 2015-01-23 DIAGNOSIS — I4819 Other persistent atrial fibrillation: Secondary | ICD-10-CM

## 2015-01-23 DIAGNOSIS — I481 Persistent atrial fibrillation: Secondary | ICD-10-CM | POA: Diagnosis not present

## 2015-01-23 NOTE — Patient Instructions (Signed)
Your physician recommends that you schedule a follow-up appointment in:  Dr. [name] in June as needed Dr. [name] in 1 year Afib clinic as needed ([phone])  Your physician has recommended that you have a sleep study. This test records several body functions during sleep, including: brain activity, eye movement, oxygen and carbon dioxide blood levels, heart rate and rhythm, breathing rate and rhythm, the flow of air through your mouth and nose, snoring, body muscle movements, and chest and belly movement.  Take BP/Heart rate when having sinking spells  Lifestyle modification as discussed and let us know if you are interested in the [facility]

## 2015-01-23 NOTE — Progress Notes (Signed)
Patient ID: Arthur Rich, male   DOB: [birth year], 64 y.o.   MRN: [id number] Primary Cardiologist:  Dr [name] Electrophysiologist: Dr. Lovena Le  The patient presents today for  follow up in the afib clinic for complaints of lightheadness/presyncopy.  Has h/o longstanding persistent afib. He wanted to discuss these episodes and any needed evaluation. They occur two times a week for the last several weeks.He has a sinking feeling and feels like he has to get something to eat, but if can't eat quickly, will become nauseated. Also is mildly dizzy/lightheaded. Not aware of irregular heartbeat. Has never taken BP with the spells. He lives alone but visited family over Easter and was told he snored.He retired in the fall and has put on around 15 lbs. Was walking on a regular basis last year but got out of the habit when the weather turned cooler.He also admits to eating poorly and missing/delaying meals at times. Had some of these spells when he saw Dr. [name] 1/16 and he did mention having a LINQ implanted, but pt declined. States these spells never occur with regular exercise, sometimes one hour after walking when relaxing.     Today, he denies symptoms of chest pain, shortness of breath, orthopnea, PND, lower extremity edema,  or neurologic sequela.  The patient feels that he is tolerating medications without difficulties and is otherwise without complaint today.   Past Medical History  Diagnosis Date  . Overweight(278.02)   . Coronary artery disease     PCI LAD with BMS 9/09  . Persistent atrial fibrillation   . Hypertension   . Hyperlipidemia   . Syncope and collapse 03/29/2010  . Atrial flutter   . Cancer     skin   Past Surgical History  Procedure Laterality Date  . Cardiac catheterization  9/09    PCI LAD (BMS)  . Tonsillectomy    . Nasal septal surgery    . Wisdom tooth extraction      Current Outpatient Prescriptions  Medication Sig Dispense Refill  . atorvastatin  (LIPITOR) 40 MG tablet Take 40 mg by mouth daily before supper.    . cholecalciferol (VITAMIN D) 1000 UNITS tablet Take 2,000 Units by mouth daily.    Marland Kitchen lisinopril (PRINIVIL,ZESTRIL) 40 MG tablet Take 40 mg by mouth every morning.    . metoprolol succinate (TOPROL-XL) 100 MG 24 hr tablet TAKE 1 TABLET BY MOUTH TWICE A DAY (NEEDS APPT FOR MORE REFILLS!!) 180 tablet 1  . PRADAXA 150 MG CAPS capsule TAKE 1 CAPSULE BY MOUTH EVERY 12 HOURS 60 capsule 5   No current facility-administered medications for this encounter.    Allergies  Allergen Reactions  . Penicillins     Childhood - Rash    History   Social History  . Marital Status: Single    Spouse Name: N/A  . Number of Children: N/A  . Years of Education: N/A   Occupational History  . Not on file.   Social History Main Topics  . Smoking status: Former Smoker -- 1.00 packs/day for 30 years    Types: Cigarettes    Quit date: 08/26/1996  . Smokeless tobacco: Never Used  . Alcohol Use: Yes     Comment: previously heavy, now trying to cut back  . Drug Use: No  . Sexual Activity: Not on file   Other Topics Concern  . Not on file   Social History Narrative    Family History  Problem Relation Age of Onset  .  Diabetes Father   . Heart disease Father   . Heart attack Maternal Uncle     early 84's  . Cancer Maternal Grandmother     unknown ?leukemia     Physical Exam: Filed Vitals:   01/23/15 1033  BP: 140/88  Pulse: 76  Height: 6' 1.5" (1.867 m)  Weight: 278 lb 3.2 oz (126.191 kg)    GEN- The patient is well appearing, alert and oriented x 3 today.   Head- normocephalic, atraumatic Eyes-  Sclera clear, conjunctiva pink Ears- hearing intact Oropharynx- clear Neck- supple, no JVP Lymph- no cervical lymphadenopathy Lungs- Clear to ausculation bilaterally, normal work of breathing Heart- irregular rate and rhythm, no murmurs, rubs or gallops, PMI not laterally displaced GI- soft, NT, ND, + BS Extremities- no  clubbing, cyanosis, or edema  EKG today reveals afib, V rate 76 bpm.   Assessment and Plan:  1. Spells with lightheadedness/dizziness Unclear etiology He is to check BP/HR with episodes and keep a log Still not interested in a LINQ, but possibly in future if spells persist  2. afib He has done very well with rate control Continue long term anticoagulation with pradaxa Does not seem to be bothered with tachyarrhythmias.  3. Obesity/Lifestyle modification Discussed at length weight control and need for regular exercise  4. Snoring history Sleep study ordered  5. HTN Stable  today He reports good BP control at home No changes  6. CAD He has not seen Dr [name] in quite some time No ischemic symptoms Pt would prefer to keep a log of symptoms and work on lifestyle and report symptoms to Dr. [name] in June as scheduled.  F/u with Dr. [name] in one year.  Afib clinc as needed.

## 2015-04-05 ENCOUNTER — Ambulatory Visit (HOSPITAL_BASED_OUTPATIENT_CLINIC_OR_DEPARTMENT_OTHER): Payer: BLUE CROSS/BLUE SHIELD | Attending: Nurse Practitioner | Admitting: Radiology

## 2015-04-05 VITALS — Ht 74.0 in | Wt 270.0 lb

## 2015-04-05 DIAGNOSIS — G4733 Obstructive sleep apnea (adult) (pediatric): Secondary | ICD-10-CM | POA: Insufficient documentation

## 2015-04-05 DIAGNOSIS — I4891 Unspecified atrial fibrillation: Secondary | ICD-10-CM | POA: Diagnosis not present

## 2015-04-05 DIAGNOSIS — I4819 Other persistent atrial fibrillation: Secondary | ICD-10-CM

## 2015-04-05 DIAGNOSIS — G4719 Other hypersomnia: Secondary | ICD-10-CM | POA: Diagnosis not present

## 2015-04-05 DIAGNOSIS — R0683 Snoring: Secondary | ICD-10-CM | POA: Diagnosis not present

## 2015-04-10 ENCOUNTER — Telehealth: Payer: Self-pay | Admitting: Cardiology

## 2015-04-10 DIAGNOSIS — G4733 Obstructive sleep apnea (adult) (pediatric): Secondary | ICD-10-CM | POA: Insufficient documentation

## 2015-04-10 DIAGNOSIS — G4719 Other hypersomnia: Secondary | ICD-10-CM | POA: Insufficient documentation

## 2015-04-10 NOTE — Sleep Study (Signed)
   NAME: Arthur Rich DATE OF BIRTH:  26-Nov-1950 MEDICAL RECORD NUMBER [id number]  LOCATION: [facility]  PHYSICIAN: TURNER,TRACI R  DATE OF STUDY: 04/05/2015  SLEEP STUDY TYPE: Nocturnal Polysomnogram               REFERRING PHYSICIAN: Sherran Needs, NP  INDICATION FOR STUDY: excessive daytime sleepiness  EPWORTH SLEEPINESS SCORE: 7 HEIGHT: 6\' 2"  (188 cm)  WEIGHT: 270 lb (122.471 kg)    Body mass index is 34.65 kg/(m^2).  NECK SIZE: 16.5 in.  MEDICATIONS: Reviewed int he chart  SLEEP ARCHITECTURE: The patient slept for a total of 208 minutes out of a total sleep period time of 343 minutes.  There was no slow wave sleep and 1 minutes or REM sleep.  The onset to sleep latency was 13 minutes and the onset to REM sleep latency was prolonged at 260 minutes.  The sleep efficiency was reduced at 58%.  RESPIRATORY DATA: There were 23 apneas, of which, 8 were obstructive, 11 were central and 5 were mixed.  There were 31 hypopneas.  The overall AHI was 16 events per hour consistent with moderate obstructive sleep apnea/hypopnea syndrome.  The RID was 29 events per hour.  Most events occurred during NREM sleep with an equal distribution between the supine and non supine positions.  There was mild snoring.  OXYGEN DATA: The lowest oxygen saturation was 90%.  The average oxygen saturation was 96%.    CARDIAC DATA: The patient maintained atrial fibrillation during the study.  The average heart rate was 63 bpm.  The lowest heart rate was 30 bpm.  The fastest heart rate was 138 bpm.   MOVEMENT/PARASOMNIA: There were no periodic limb movements or REM sleep behavior disorders.  IMPRESSION/ RECOMMENDATION:   1.  Moderate obstructive sleep apnea/hypopnea syndrome with an AHI of 16 events per hour.  Most events occurred during NREM sleep with an equal distribution between the supine and non supine positions 2.  Mild snoring was noted. 3.  Reduced sleep efficiency with  increased frequency of arousals due to respiratory and spontaneous events.  4.  Abnormal sleep architecture with no slow wave sleep and reduced REM sleep with prolonged latency to REM sleep onset. 5.  The patient was in atrial fibrillation throughout the study.   6.  Minimal oxygen desaturations were noted during respiratory events. 7.  CPAP titration would be appropriate.  Treatment would also include careful attention to proper sleep hygiene, weight reduction for elevated BMI, avoidance of sleeping in the supine position and avoidance of alcohol within four hours of bedtime.  Specific treatment decisions should be tailored to each patient based upon the clinical situation and all treatment options should be considered.  The patient should be instructed to avoid driving if sleepy and careful clinical follow up is needed to ensure that the patient's symptoms are improving with therapy and the PAP adherence is supported and measured if prescribed.    Signed: Sueanne Margarita Diplomate, American Board of Sleep Medicine  ELECTRONICALLY SIGNED ON:  04/10/2015, 7:57 PM [place] PH: [phone]   FX: [phone] [place]

## 2015-04-10 NOTE — Telephone Encounter (Signed)
Please let patient know that they have sleep apnea and recommend CPAP titration. Please set up titration in the sleep lab. 

## 2015-04-11 ENCOUNTER — Encounter: Payer: Self-pay | Admitting: *Deleted

## 2015-04-11 ENCOUNTER — Other Ambulatory Visit: Payer: Self-pay | Admitting: *Deleted

## 2015-04-11 DIAGNOSIS — G4733 Obstructive sleep apnea (adult) (pediatric): Secondary | ICD-10-CM

## 2015-04-11 NOTE — Addendum Note (Signed)
Addended by: Andres Ege on: 04/11/2015 09:42 AM   Modules accepted: Orders

## 2015-04-11 NOTE — Telephone Encounter (Signed)
Patient is aware of results, and is okay to proceed.

## 2015-04-11 NOTE — Telephone Encounter (Signed)
Titration is scheduled for 07/01/15, with patient added to cancellation list.   Letter has been sent to patient along with Sleep Hygiene paper per the patient request.

## 2015-04-16 ENCOUNTER — Other Ambulatory Visit: Payer: Self-pay | Admitting: Interventional Cardiology

## 2015-05-10 ENCOUNTER — Telehealth: Payer: Self-pay | Admitting: Internal Medicine

## 2015-05-10 NOTE — Telephone Encounter (Signed)
New Message    Pt has not seen Dr. [name] since 2010 and he said that he is a pt of Dr. [name] and it is confusing on his request  He said that he has a documentation that says he is suppose to see them both every 6 months.  Please give him a call because he said that he never received a care plan.

## 2015-05-10 NOTE — Telephone Encounter (Signed)
Spoke with patient and have him scheduled for 07/06/15 at 1:45pm  Patient aware

## 2015-05-11 ENCOUNTER — Other Ambulatory Visit: Payer: Self-pay | Admitting: *Deleted

## 2015-05-11 ENCOUNTER — Other Ambulatory Visit: Payer: Self-pay | Admitting: Interventional Cardiology

## 2015-05-11 MED ORDER — METOPROLOL SUCCINATE ER 100 MG PO TB24: ORAL_TABLET | ORAL | Status: DC

## 2015-05-14 ENCOUNTER — Other Ambulatory Visit: Payer: Self-pay | Admitting: Internal Medicine

## 2015-07-01 ENCOUNTER — Ambulatory Visit (HOSPITAL_BASED_OUTPATIENT_CLINIC_OR_DEPARTMENT_OTHER): Payer: BLUE CROSS/BLUE SHIELD | Attending: Cardiology

## 2015-07-01 VITALS — Ht 74.0 in | Wt 270.0 lb

## 2015-07-01 DIAGNOSIS — G4730 Sleep apnea, unspecified: Secondary | ICD-10-CM | POA: Diagnosis present

## 2015-07-01 DIAGNOSIS — G4733 Obstructive sleep apnea (adult) (pediatric): Secondary | ICD-10-CM

## 2015-07-02 ENCOUNTER — Telehealth: Payer: Self-pay | Admitting: Cardiology

## 2015-07-02 NOTE — Addendum Note (Signed)
Addended by: Sueanne Margarita on: 07/02/2015 08:54 PM   Modules accepted: Orders

## 2015-07-02 NOTE — Telephone Encounter (Signed)
Pt had successful PAP titration. Please setup appointment in 10 weeks. Please let [facility] know that order for PAP is in EPIC.   

## 2015-07-02 NOTE — Progress Notes (Signed)
   Patient Name: Arthur Rich, Arthur Rich MRN: [id number] Study Date: 07/01/2015 Gender: Male D.O.B: Aug 14, 1951 Age (years): 44 Referring Provider: Fransico Him MD, ABSM Interpreting Physician: Fransico Him MD, ABSM RPSGT: Madelon Lips  Weight (lbs): 270 Height (inches): 74 BMI: 35 Neck Size: 16.50  CLINICAL INFORMATION The patient is referred for a CPAP titration to treat sleep apnea.  Date of NPSG, Split Night or HST: 04/10/2015  SLEEP STUDY TECHNIQUE As per the AASM Manual for the Scoring of Sleep and Associated Events v2.3 (April 2016) with a hypopnea requiring 4% desaturations.  The channels recorded and monitored were frontal, central and occipital EEG, electrooculogram (EOG), submentalis EMG (chin), nasal and oral airflow, thoracic and abdominal wall motion, anterior tibialis EMG, snore microphone, electrocardiogram, and pulse oximetry. Continuous positive airway pressure (CPAP) was initiated at the beginning of the study and titrated to treat sleep-disordered breathing.  MEDICATIONS Medications taken by the patient : Lipitor, Vit D, Lisinopril, Metoprolol, Pradaxa Medications administered by patient during sleep study : No sleep medicine administered.  TECHNICIAN COMMENTS Comments added by technician: ATRIAL FIB AND ATRIAL FLUTTER NOTED THROUGHOUT STUDY. SOME EVENTS DID NOT MEET FULL SCORING CRITERIA  Comments added by scorer: N/A  RESPIRATORY PARAMETERS Optimal PAP Pressure (cm): 7  AHI at Optimal Pressure (/hr):0.0 Overall Minimal O2 (%):93.00   Supine % at Optimal Pressure (%):0 Minimal O2 at Optimal Pressure (%):95.0      SLEEP ARCHITECTURE The study was initiated at 10:28:17 PM and ended at 4:33:12 AM.  Sleep onset time was 8.1 minutes and the sleep efficiency was reduced at 78.2%. The total sleep time was 285.5 minutes.  The patient spent 7.88% of the night in stage N1 sleep, 76.01% in stage N2 sleep, 1.58% in stage N3 and 14.54% in REM.Stage REM latency was 97.0  minutes  Wake after sleep onset was 71.4. Alpha intrusion was absent. Supine sleep was 31.17%.  CARDIAC DATA The 2 lead EKG demonstrated atrial fibrillation. The mean heart rate was 58.96 beats per minute. Other EKG findings include: None.  LEG MOVEMENT DATA The total Periodic Limb Movements of Sleep (PLMS) were 0. The PLMS index was 0.00. A PLMS index of <15 is considered normal in adults.  IMPRESSIONS The optimal PAP pressure was 7 cm of water. Central sleep apnea was not noted during this titration (CAI = 0.0/h). Moderate oxygen desaturations were observed during this titration (min O2 = 93.00%). No snoring was audible during this study. No cardiac abnormalities were observed during this study. Clinically significant periodic limb movements were not noted during this study. Arousals associated with PLMs were rare.  DIAGNOSIS Obstructive Sleep Apnea (327.23 [G47.33 ICD-10])  RECOMMENDATIONS Trial of CPAP therapy on 7 cm H2O with a Medium size Fisher&Paykel Full Face Mask Simplus mask and heated humidification. Avoid alcohol, sedatives and other CNS depressants that may worsen sleep apnea and disrupt normal sleep architecture. Sleep hygiene should be reviewed to assess factors that may improve sleep quality. Weight management and regular exercise should be initiated or continued. Return to [place] for re-evaluation after 10 weeks of therapy and CPAP download in 4 weeks to assess compliance.   Sueanne Margarita Diplomate, American Board of Sleep Medicine  ELECTRONICALLY SIGNED ON:  07/02/2015, 8:39 PM [place] PH: [phone]   FX: [phone] [place]

## 2015-07-03 NOTE — Telephone Encounter (Signed)
Left message for patient to call back  

## 2015-07-03 NOTE — Telephone Encounter (Signed)
Patient notified. [facility] is aware.  Once he receives his machine we will scheduled 10 week follow-up

## 2015-07-06 ENCOUNTER — Ambulatory Visit (INDEPENDENT_AMBULATORY_CARE_PROVIDER_SITE_OTHER): Payer: BLUE CROSS/BLUE SHIELD | Admitting: Interventional Cardiology

## 2015-07-06 ENCOUNTER — Encounter: Payer: Self-pay | Admitting: Interventional Cardiology

## 2015-07-06 VITALS — BP 138/92 | HR 88 | Ht 73.0 in | Wt 274.8 lb

## 2015-07-06 DIAGNOSIS — I483 Typical atrial flutter: Secondary | ICD-10-CM | POA: Diagnosis not present

## 2015-07-06 DIAGNOSIS — I10 Essential (primary) hypertension: Secondary | ICD-10-CM | POA: Diagnosis not present

## 2015-07-06 DIAGNOSIS — I481 Persistent atrial fibrillation: Secondary | ICD-10-CM

## 2015-07-06 DIAGNOSIS — I4819 Other persistent atrial fibrillation: Secondary | ICD-10-CM

## 2015-07-06 DIAGNOSIS — I2510 Atherosclerotic heart disease of native coronary artery without angina pectoris: Secondary | ICD-10-CM | POA: Diagnosis not present

## 2015-07-06 DIAGNOSIS — G4733 Obstructive sleep apnea (adult) (pediatric): Secondary | ICD-10-CM | POA: Diagnosis not present

## 2015-07-06 DIAGNOSIS — R42 Dizziness and giddiness: Secondary | ICD-10-CM | POA: Insufficient documentation

## 2015-07-06 NOTE — Progress Notes (Signed)
Cardiology Office Note   Date:  07/06/2015   ID:  CLEDIS SOHN, DOB Feb 01, 1951, MRN [id number]  PCP:  Chevis Pretty, MD  Cardiologist:  Sinclair Grooms, MD   No chief complaint on file.     History of Present Illness: Arthur Rich is a 64 y.o. male who presents for persistent atrial fibrillation, obesity, sleep apnea, hyperlipidemia, CAD with bare-metal LAD stent, and prior history of atrial flutter.  I have not seen this patient in quite some time. He was being managed in the atrial fibrillation clinic. They have now sent him back for me to put this patient in his care. He denies angina. He denies dyspnea. In the spring he was having recurring episodes of dizziness associated with nausea and weakness. These episodes can last up to an hour. There was a swaying/rotational quality to the episodes. They have resolved this summer and not recurred.    Past Medical History  Diagnosis Date  . Overweight(278.02)   . Coronary artery disease     PCI LAD with BMS 9/09  . Persistent atrial fibrillation   . Hypertension   . Hyperlipidemia   . Syncope and collapse 03/29/2010  . Atrial flutter   . Cancer     skin    Past Surgical History  Procedure Laterality Date  . Cardiac catheterization  9/09    PCI LAD (BMS)  . Tonsillectomy    . Nasal septal surgery    . Wisdom tooth extraction       Current Outpatient Prescriptions  Medication Sig Dispense Refill  . atorvastatin (LIPITOR) 40 MG tablet Take 40 mg by mouth daily before supper.    . cholecalciferol (VITAMIN D) 1000 UNITS tablet Take 2,000 Units by mouth daily.    Marland Kitchen lisinopril (PRINIVIL,ZESTRIL) 40 MG tablet Take 40 mg by mouth every morning.    . metoprolol succinate (TOPROL-XL) 100 MG 24 hr tablet Take 100 mg by mouth 2 (two) times daily. Take with or immediately following a meal.    . PRADAXA 150 MG CAPS capsule TAKE 1 CAPSULE BY MOUTH EVERY 12 HOURS 60 capsule 5   No current facility-administered  medications for this visit.    Allergies:   Penicillins    Social History:  The patient  reports that he quit smoking about 18 years ago. His smoking use included Cigarettes. He has a 30 pack-year smoking history. He has never used smokeless tobacco. He reports that he drinks alcohol. He reports that he does not use illicit drugs.   Family History:  The patient's family history includes Cancer in his maternal grandmother; Diabetes in his father; Heart attack in his maternal uncle; Heart disease in his father.    ROS:  Please see the history of present illness.   Otherwise, review of systems are positive for obesity, nausea, weakness,.   All other systems are reviewed and negative.    PHYSICAL EXAM: VS:  BP 138/92 mmHg  Pulse 88  Ht 6\' 1"  (1.854 m)  Wt 124.648 kg (274 lb 12.8 oz)  BMI 36.26 kg/m2  SpO2 96% , BMI Body mass index is 36.26 kg/(m^2). GEN: Well nourished, well developed, in no acute distress HEENT: normal Neck: no JVD, carotid bruits, or masses Cardiac: IIRR.  There is no murmur, rub, or gallop. There is no edema. Respiratory:  clear to auscultation bilaterally, normal work of breathing. GI: soft, nontender, nondistended, + BS MS: no deformity or atrophy Skin: warm and dry, no rash Neuro:  Strength and sensation are intact Psych: euthymic mood, full affect   EKG:  EKG is not ordered today.    Recent Labs: No results found for requested labs within last 365 days.    Lipid Panel    Component Value Date/Time   CHOL 197 09/06/2013 1417   TRIG 191.0* 09/06/2013 1417   HDL 46.00 09/06/2013 1417   CHOLHDL 4 09/06/2013 1417   VLDL 38.2 09/06/2013 1417   LDLCALC 113* 09/06/2013 1417      Wt Readings from Last 3 Encounters:  07/06/15 124.648 kg (274 lb 12.8 oz)  07/01/15 122.471 kg (270 lb)  04/05/15 122.471 kg (270 lb)      Other studies Reviewed: Additional studies/ records that were reviewed today include: Reviewed EP studies and notes. The findings  include reviewed recent diagnosis of sleep apnea.    ASSESSMENT AND PLAN:  1. OSA (obstructive sleep apnea) Recent diagnosis. Not yet on chronic therapy  2. Essential hypertension, benign Borderline control. Needs weight reduction  3. Atherosclerosis of native coronary artery of native heart without angina pectoris Asymptomatic  4. Typical atrial flutter Has not recurred that we know of - Holter monitor - 24 hour; Future  5. Persistent atrial fibrillation Assuming adequate rate control - Holter monitor - 24 hour; Future  6. Recurring dizziness/lightheadedness with accompanying nausea. Rule out vertigo.   Current medicines are reviewed at length with the patient today.  The patient has the following concerns regarding medicines:  none.  The following changes/actions have been instituted:    24-hour Holter to assess rate control  Obtain recent laboratory data from Dr. Chevis Pretty a [facility]  Weight loss  Aerobic activity  Labs/ tests ordered today include:  Orders Placed This Encounter  Procedures  . Holter monitor - 24 hour     Disposition:   FU with HS in 6 months  Signed, Sinclair Grooms, MD  07/06/2015 2:31 PM    [address], [place], [place]  [address] Phone: [phone]; Fax: [phone]

## 2015-07-06 NOTE — Patient Instructions (Signed)
Medication Instructions:  Your physician recommends that you continue on your current medications as directed. Please refer to the Current Medication list given to you today.   Labwork: None ordered  Testing/Procedures: Your physician has recommended that you wear a holter monitor. Holter monitors are medical devices that record the heart's electrical activity. Doctors most often use these monitors to diagnose arrhythmias. Arrhythmias are problems with the speed or rhythm of the heartbeat. The monitor is a small, portable device. You can wear one while you do your normal daily activities. This is usually used to diagnose what is causing palpitations/syncope (passing out).  Follow-Up: Your physician wants you to follow-up in: 6 months with Dr.Smith You will receive a reminder letter in the mail two months in advance. If you don't receive a letter, please call our office to schedule the follow-up appointment.   Any Other Special Instructions Will Be Listed Below (If Applicable).

## 2015-07-10 ENCOUNTER — Ambulatory Visit (INDEPENDENT_AMBULATORY_CARE_PROVIDER_SITE_OTHER): Payer: BLUE CROSS/BLUE SHIELD

## 2015-07-10 DIAGNOSIS — I4819 Other persistent atrial fibrillation: Secondary | ICD-10-CM

## 2015-07-10 DIAGNOSIS — I481 Persistent atrial fibrillation: Secondary | ICD-10-CM | POA: Diagnosis not present

## 2015-07-10 DIAGNOSIS — I483 Typical atrial flutter: Secondary | ICD-10-CM

## 2015-07-16 ENCOUNTER — Other Ambulatory Visit: Payer: Self-pay

## 2015-07-17 ENCOUNTER — Other Ambulatory Visit: Payer: Self-pay

## 2015-07-17 ENCOUNTER — Telehealth: Payer: Self-pay

## 2015-07-17 NOTE — Telephone Encounter (Signed)
-----   Message from Belva Crome, MD sent at 07/16/2015  3:54 PM EDT ----- Continuous atrial fibrillation but with reasonably good rate control. No changes are needed.

## 2015-07-17 NOTE — Telephone Encounter (Signed)
Pt aware of holter monitor results with verbal understanding.  Continuous atrial fibrillation but with reasonably good rate control. No changes are needed.

## 2015-07-26 ENCOUNTER — Other Ambulatory Visit: Payer: Self-pay | Admitting: Interventional Cardiology

## 2015-08-24 ENCOUNTER — Encounter: Payer: Self-pay | Admitting: Cardiology

## 2015-09-04 ENCOUNTER — Telehealth: Payer: Self-pay | Admitting: *Deleted

## 2015-09-04 NOTE — Telephone Encounter (Signed)
ERRONEOUS ENCOUNTER

## 2015-09-06 ENCOUNTER — Telehealth: Payer: Self-pay | Admitting: *Deleted

## 2015-09-06 DIAGNOSIS — G4733 Obstructive sleep apnea (adult) (pediatric): Secondary | ICD-10-CM

## 2015-09-06 NOTE — Telephone Encounter (Signed)
Patient informed of results. Order placed - sent to [facility].

## 2015-09-06 NOTE — Telephone Encounter (Signed)
-----   Message from Sueanne Margarita, MD sent at 08/26/2015  7:50 PM EST ----- Please get a 2 week CPAP  autotitration from 4 to 20cm H2O.  Please find out what mask patient wears, if he sleeps supine and if he is turning his machine off if he gets up in the middle of the night.

## 2015-09-28 ENCOUNTER — Encounter: Payer: Self-pay | Admitting: Cardiology

## 2015-09-28 ENCOUNTER — Ambulatory Visit (INDEPENDENT_AMBULATORY_CARE_PROVIDER_SITE_OTHER): Payer: BLUE CROSS/BLUE SHIELD | Admitting: Cardiology

## 2015-09-28 VITALS — BP 130/84 | HR 56 | Ht 73.0 in | Wt 275.0 lb

## 2015-09-28 DIAGNOSIS — E669 Obesity, unspecified: Secondary | ICD-10-CM | POA: Diagnosis not present

## 2015-09-28 DIAGNOSIS — I10 Essential (primary) hypertension: Secondary | ICD-10-CM | POA: Diagnosis not present

## 2015-09-28 DIAGNOSIS — G4733 Obstructive sleep apnea (adult) (pediatric): Secondary | ICD-10-CM

## 2015-09-28 HISTORY — DX: Obesity, unspecified: E66.9

## 2015-09-28 NOTE — Progress Notes (Signed)
Cardiology Office Note   Date:  09/28/2015   ID:  Arthur Rich, DOB [birth year], MRN [id number]  PCP:  Chevis Pretty, MD    Chief Complaint  Patient presents with  . Sleep Apnea      History of Present Illness: Arthur Rich is a 64 y.o. male who presents for evaluation of OSA.  He has a historyo f HTN, PAF, CAD and syncope.  He recently had a PSG done showing moderate OSA with an AHI of 16/hr with reduced sleep efficiency.  He underwent CPAP titration to 7cm H2O.  He is doing well with his CPAP therapy.  He tolerates his nasal pillow mask and feels the pressure is adequate.  He did not tolerate the full face mask.  He does have some problems with dry mouth and has a chin strap but has not been using consistently.  He has not had much problems with mouth or nasal dryness or head congestion.  He does not really have significant daytime sleepiness.     Past Medical History  Diagnosis Date  . Overweight(278.02)   . Coronary artery disease     PCI LAD with BMS 9/09  . Persistent atrial fibrillation ([place])   . Hypertension   . Hyperlipidemia   . Syncope and collapse 03/29/2010  . Atrial flutter ([place])   . Cancer [facility])     skin    Past Surgical History  Procedure Laterality Date  . Cardiac catheterization  9/09    PCI LAD (BMS)  . Tonsillectomy    . Nasal septal surgery    . Wisdom tooth extraction       Current Outpatient Prescriptions  Medication Sig Dispense Refill  . atorvastatin (LIPITOR) 40 MG tablet Take 40 mg by mouth daily before supper.    . cholecalciferol (VITAMIN D) 1000 UNITS tablet Take 2,000 Units by mouth daily.    Marland Kitchen lisinopril (PRINIVIL,ZESTRIL) 40 MG tablet Take 40 mg by mouth every morning.    . metoprolol succinate (TOPROL-XL) 100 MG 24 hr tablet Take 100 mg by mouth 2 (two) times daily. Take with or immediately following a meal.    . PRADAXA 150 MG CAPS capsule TAKE 1 CAPSULE BY MOUTH EVERY 12 HOURS 60 capsule 5   No  current facility-administered medications for this visit.    Allergies:   Penicillins    Social History:  The patient  reports that he quit smoking about 19 years ago. His smoking use included Cigarettes. He has a 30 pack-year smoking history. He has never used smokeless tobacco. He reports that he drinks alcohol. He reports that he does not use illicit drugs.   Family History:  The patient's family history includes Cancer in his maternal grandmother; Diabetes in his father; Heart attack in his maternal uncle; Heart disease in his father.    ROS:  Please see the history of present illness.   Otherwise, review of systems are positive for none.   All other systems are reviewed and negative.    PHYSICAL EXAM: VS:  BP 130/84 mmHg  Pulse 56  Ht 6\' 1"  (1.854 m)  Wt 275 lb (124.739 kg)  BMI 36.29 kg/m2  SpO2 96% , BMI Body mass index is 36.29 kg/(m^2). GEN: Well nourished, well developed, in no acute distress HEENT: normal Neck: no JVD, carotid bruits, or masses Cardiac: RRR; no murmurs, rubs, or gallops,no edema  Respiratory:  clear to auscultation bilaterally, normal work of breathing GI: soft, nontender, nondistended, + BS MS: no deformity or atrophy Skin: warm and dry, no rash Neuro:  Strength and sensation are intact Psych: euthymic mood, full affect   EKG:  EKG is not ordered today.   Recent Labs: No results found for requested labs within last 365 days.    Lipid Panel    Component Value Date/Time   CHOL 197 09/06/2013 1417   TRIG 191.0* 09/06/2013 1417   HDL 46.00 09/06/2013 1417   CHOLHDL 4 09/06/2013 1417   VLDL 38.2 09/06/2013 1417   LDLCALC 113* 09/06/2013 1417      Wt Readings from Last 3 Encounters:  09/28/15 275 lb (124.739 kg)  07/06/15 274 lb 12.8 oz (124.648 kg)  07/01/15 270 lb (122.471 kg)        ASSESSMENT AND PLAN:  1.  Moderate OSA currently on CPAP and tolerating well.  Patient has been using and benefiting from CPAP use and will continue  to benefit from therapy. He does not know if he snores.  He was supposed to have a 2 week autotitration but this never occurred.  I will get this ordered.   2.  HTN - BP controlled on current medical regimen.     Current medicines are reviewed at length with the patient today.  The patient does not have concerns regarding medicines.  The following changes have been made:  no change  Labs/ tests ordered today: See above Assessment and Plan No orders of the defined types were placed in this encounter.     Disposition:   FU with me in 6 months  Signed, Sueanne Margarita, MD  09/28/2015 1:24 PM    [facility] [place], [place], [place]  [address] Phone: [phone]; Fax: [phone]

## 2015-09-28 NOTE — Patient Instructions (Signed)

## 2015-10-04 ENCOUNTER — Encounter: Payer: Self-pay | Admitting: Cardiology

## 2015-11-01 ENCOUNTER — Telehealth: Payer: Self-pay

## 2015-11-01 NOTE — Telephone Encounter (Signed)
Duplicate encounter opened. 

## 2015-11-01 NOTE — Telephone Encounter (Signed)
Patient calls to let us know that [facility] is no longer covering Pradaxa. Not even with a PA. Preferred drugs are Eliquis or Xarelto. Xarelto more likely to be covered. He has enough Pradaxa for a few weeks.

## 2015-11-01 NOTE — Telephone Encounter (Signed)
Ok to stop pradaxa and start xarelto 20mg  daily

## 2015-11-02 ENCOUNTER — Other Ambulatory Visit: Payer: Self-pay

## 2015-11-02 MED ORDER — RIVAROXABAN 20 MG PO TABS: 20.0000 mg | ORAL_TABLET | Freq: Every day | ORAL | Status: DC

## 2015-11-02 NOTE — Telephone Encounter (Signed)
Patient informed. He has a few weeks of Pradaxa left, so will finish that first. New rx for Xarelto sent to [facility].

## 2015-11-16 ENCOUNTER — Other Ambulatory Visit: Payer: Self-pay | Admitting: Internal Medicine

## 2016-01-09 NOTE — Progress Notes (Signed)
Cardiology Office Note:    Date:  01/10/2016   ID:  Arthur Rich, DOB 1951-06-13, MRN [id number]  PCP:  Chevis Pretty, MD  Cardiologist:  Dr. Daneen Schick   Electrophysiologist:  Dr. Thompson Grayer / [facility] Sleep med: Dr. Fransico Him   Chief Complaint  Patient presents with  . Coronary Artery Disease    Follow up  . Atrial Fibrillation    History of Present Illness:     Arthur Rich is a 65 y.o. male with a hx of persistent atrial fibrillation, CAD status post remote BMS to the LAD in 2009, OSA, HL, obesity, prior A flutter.  Last seen by Dr. [name] 9/16.  Here alone for routine FU. He has a hx of dizzy spells. None since last Spring.  Tol CPAP ok.  Denies chest pain, dyspnea.  Denies orthopnea, PND, edema.  Denies syncope.  He has some issues with bruising.  Denies melena, hematochezia.     Past Medical History  Diagnosis Date  . Overweight(278.02)   . Coronary artery disease     PCI LAD with BMS 9/09  . Persistent atrial fibrillation ([place])   . Hypertension   . Hyperlipidemia   . Syncope and collapse 03/29/2010  . Atrial flutter ([place])   . Cancer ([place])     skin  . Obesity (BMI 30-39.9) 09/28/2015    Past Surgical History  Procedure Laterality Date  . Cardiac catheterization  9/09    PCI LAD (BMS)  . Tonsillectomy    . Nasal septal surgery    . Wisdom tooth extraction      Current Medications: Outpatient Prescriptions Prior to Visit  Medication Sig Dispense Refill  . atorvastatin (LIPITOR) 40 MG tablet Take 40 mg by mouth daily before supper.    . cholecalciferol (VITAMIN D) 1000 UNITS tablet Take 2,000 Units by mouth daily.    Marland Kitchen lisinopril (PRINIVIL,ZESTRIL) 40 MG tablet Take 40 mg by mouth every morning.    . metoprolol succinate (TOPROL-XL) 100 MG 24 hr tablet Take 100 mg by mouth 2 (two) times daily. Take with or immediately following a meal.    . rivaroxaban (XARELTO) 20 MG TABS tablet Take 1 tablet (20 mg total) by mouth daily with supper.  30 tablet 5   No facility-administered medications prior to visit.     Allergies:   Penicillins   Social History   Social History  . Marital Status: Single    Spouse Name: N/A  . Number of Children: N/A  . Years of Education: N/A   Social History Main Topics  . Smoking status: Former Smoker -- 1.00 packs/day for 30 years    Types: Cigarettes    Quit date: 08/26/1996  . Smokeless tobacco: Never Used  . Alcohol Use: Yes     Comment: previously heavy, now trying to cut back  . Drug Use: No  . Sexual Activity: Not Asked   Other Topics Concern  . None   Social History Narrative     Family History:  The patient's    family history includes Cancer in his maternal grandmother; Diabetes in his father; Heart attack in his maternal uncle; Heart disease in his father.   ROS:   Please see the history of present illness.    ROS All other systems reviewed and are negative.   Physical Exam:    VS:  BP 132/82 mmHg  Pulse 74  Ht 6\' 2"  (1.88 m)  Wt 273 lb 12.8 oz (124.195 kg)  BMI 35.14 kg/m2   GEN: Well nourished, well developed, in no acute distress HEENT: normal Neck: no JVD, no masses Cardiac: Normal S1/S2, irreg irreg rhythm; no murmurs, rubs, or gallops, no edema;     Respiratory:  clear to auscultation bilaterally; no wheezing, rhonchi or rales GI: soft, nontender, nondistended MS: no deformity or atrophy Skin: warm and dry Neuro: No focal deficits  Psych: Alert and oriented x 3, normal affect  Wt Readings from Last 3 Encounters:  01/10/16 273 lb 12.8 oz (124.195 kg)  09/28/15 275 lb (124.739 kg)  07/06/15 274 lb 12.8 oz (124.648 kg)      Studies/Labs Reviewed:     EKG:  EKG is   ordered today.  The ekg ordered today demonstrates AFib, HR 74, normal axis, no changes.   Recent Labs: No results found for requested labs within last 365 days.   Recent Lipid Panel    Component Value Date/Time   CHOL 197 09/06/2013 1417   TRIG 191.0* 09/06/2013 1417   HDL  46.00 09/06/2013 1417   CHOLHDL 4 09/06/2013 1417   VLDL 38.2 09/06/2013 1417   LDLCALC 113* 09/06/2013 1417    Additional studies/ records that were reviewed today include:   Holter 9/16 AFib, avg HR 70, rare HR up to 150, rare brady with 35 bpm and 3 sec pause during HS  Echo 10/14 EF 55-65%, mild MR, mod LAE, PASP 31 mmHg  LHC 9/09 BMS to pLAD   ASSESSMENT:     1. Atherosclerosis of native coronary artery of native heart without angina pectoris   2. Chronic atrial fibrillation (HCC)   3. Essential hypertension, benign   4. HLD (hyperlipidemia)   5. OSA (obstructive sleep apnea)     PLAN:     In order of problems listed above:  1. CAD - No angina.  He is not on ASA as he is on Xarelto.  Continue statin.    2. Chronic AFib - Rate controlled.  Previously on Pradaxa.  Now on Xarelto due to insurance coverage.  No CBC or BMET in several mos.  Check BMET, CBC today.  3. HTN - Controlled.   4. HL - LDL in 12/16 was 99.  Continue Lipitor 40.    5. OSA - Continue CPAP; FU with Dr. Fransico Him.    Medication Adjustments/Labs and Tests Ordered: Current medicines are reviewed at length with the patient today.  Concerns regarding medicines are outlined above.  Medication changes, Labs and Tests ordered today are outlined in the Patient Instructions noted below. Patient Instructions  Medication Instructions:  Your physician recommends that you continue on your current medications as directed. Please refer to the Current Medication list given to you today.  Labwork: TODAY BMET, CBC   Testing/Procedures: NONE  Follow-Up: Your physician wants you to follow-up in: [place] [name]. You will receive a reminder letter in the mail two months in advance. If you don't receive a letter, please call our office to schedule the follow-up appointment.  Any Other Special Instructions Will Be Listed Below (If Applicable).  If you need a refill on your cardiac medications  before your next appointment, please call your pharmacy.   Signed, Richardson Dopp, PA-C  01/10/2016 12:07 PM    [facility] [place], [place], [place]  [address] Phone: [phone]; Fax: [phone]

## 2016-01-10 ENCOUNTER — Encounter: Payer: Self-pay | Admitting: Physician Assistant

## 2016-01-10 ENCOUNTER — Ambulatory Visit (INDEPENDENT_AMBULATORY_CARE_PROVIDER_SITE_OTHER): Payer: BLUE CROSS/BLUE SHIELD | Admitting: Physician Assistant

## 2016-01-10 VITALS — BP 132/82 | HR 74 | Ht 74.0 in | Wt 273.8 lb

## 2016-01-10 DIAGNOSIS — E785 Hyperlipidemia, unspecified: Secondary | ICD-10-CM

## 2016-01-10 DIAGNOSIS — I482 Chronic atrial fibrillation, unspecified: Secondary | ICD-10-CM

## 2016-01-10 DIAGNOSIS — I2510 Atherosclerotic heart disease of native coronary artery without angina pectoris: Secondary | ICD-10-CM | POA: Diagnosis not present

## 2016-01-10 DIAGNOSIS — I10 Essential (primary) hypertension: Secondary | ICD-10-CM | POA: Diagnosis not present

## 2016-01-10 DIAGNOSIS — G4733 Obstructive sleep apnea (adult) (pediatric): Secondary | ICD-10-CM

## 2016-01-10 LAB — BASIC METABOLIC PANEL
BUN: 15 mg/dL (ref 7–25)
CO2: 26 mmol/L (ref 20–31)
Calcium: 9.2 mg/dL (ref 8.6–10.3)
Chloride: 105 mmol/L (ref 98–110)
Creat: 0.87 mg/dL (ref 0.70–1.25)
Glucose, Bld: 114 mg/dL — ABNORMAL HIGH (ref 65–99)
Potassium: 4.4 mmol/L (ref 3.5–5.3)
Sodium: 141 mmol/L (ref 135–146)

## 2016-01-10 LAB — CBC WITH DIFFERENTIAL/PLATELET
Basophils Absolute: 0.1 10*3/uL (ref 0.0–0.1)
Basophils Relative: 1 % (ref 0–1)
Eosinophils Absolute: 0.1 10*3/uL (ref 0.0–0.7)
Eosinophils Relative: 2 % (ref 0–5)
HCT: 43.5 % (ref 39.0–52.0)
Hemoglobin: 14.9 g/dL (ref 13.0–17.0)
Lymphocytes Relative: 22 % (ref 12–46)
Lymphs Abs: 1.5 10*3/uL (ref 0.7–4.0)
MCH: 30 pg (ref 26.0–34.0)
MCHC: 34.3 g/dL (ref 30.0–36.0)
MCV: 87.7 fL (ref 78.0–100.0)
MPV: 10.5 fL (ref 8.6–12.4)
Monocytes Absolute: 0.7 10*3/uL (ref 0.1–1.0)
Monocytes Relative: 11 % (ref 3–12)
Neutro Abs: 4.3 10*3/uL (ref 1.7–7.7)
Neutrophils Relative %: 64 % (ref 43–77)
Platelets: 218 10*3/uL (ref 150–400)
RBC: 4.96 MIL/uL (ref 4.22–5.81)
RDW: 13.8 % (ref 11.5–15.5)
WBC: 6.7 10*3/uL (ref 4.0–10.5)

## 2016-01-10 NOTE — Patient Instructions (Addendum)
Medication Instructions:  Your physician recommends that you continue on your current medications as directed. Please refer to the Current Medication list given to you today.  Labwork: TODAY BMET, CBC   Testing/Procedures: NONE  Follow-Up: Your physician wants you to follow-up in: [place] [name]. You will receive a reminder letter in the mail two months in advance. If you don't receive a letter, please call our office to schedule the follow-up appointment.  Any Other Special Instructions Will Be Listed Below (If Applicable).  If you need a refill on your cardiac medications before your next appointment, please call your pharmacy.

## 2016-01-11 ENCOUNTER — Telehealth: Payer: Self-pay | Admitting: *Deleted

## 2016-01-11 NOTE — Telephone Encounter (Signed)
Lmtcb to go over results 

## 2016-01-11 NOTE — Telephone Encounter (Signed)
Lmtcb x 2; results released to [place]

## 2016-01-13 ENCOUNTER — Other Ambulatory Visit: Payer: Self-pay | Admitting: Interventional Cardiology

## 2016-04-30 ENCOUNTER — Other Ambulatory Visit: Payer: Self-pay

## 2016-04-30 MED ORDER — RIVAROXABAN 20 MG PO TABS: 20.0000 mg | ORAL_TABLET | Freq: Every day | ORAL | Status: DC

## 2016-08-09 ENCOUNTER — Other Ambulatory Visit: Payer: Self-pay | Admitting: Interventional Cardiology

## 2016-09-24 DIAGNOSIS — Z23 Encounter for immunization: Secondary | ICD-10-CM | POA: Diagnosis not present

## 2016-11-16 ENCOUNTER — Other Ambulatory Visit: Payer: Self-pay | Admitting: Interventional Cardiology

## 2016-11-27 ENCOUNTER — Encounter: Payer: Self-pay | Admitting: Family Medicine

## 2016-11-27 ENCOUNTER — Ambulatory Visit (INDEPENDENT_AMBULATORY_CARE_PROVIDER_SITE_OTHER): Payer: Medicare Other | Admitting: Family Medicine

## 2016-11-27 VITALS — BP 163/90 | HR 88 | Temp 98.0°F | Ht 74.0 in | Wt 281.8 lb

## 2016-11-27 DIAGNOSIS — K429 Umbilical hernia without obstruction or gangrene: Secondary | ICD-10-CM | POA: Diagnosis not present

## 2016-11-27 DIAGNOSIS — Z125 Encounter for screening for malignant neoplasm of prostate: Secondary | ICD-10-CM

## 2016-11-27 DIAGNOSIS — Z23 Encounter for immunization: Secondary | ICD-10-CM

## 2016-11-27 DIAGNOSIS — Z130 Encounter for screening for diseases of the blood and blood-forming organs and certain disorders involving the immune mechanism: Secondary | ICD-10-CM | POA: Diagnosis not present

## 2016-11-27 DIAGNOSIS — I482 Chronic atrial fibrillation: Secondary | ICD-10-CM

## 2016-11-27 DIAGNOSIS — Z1159 Encounter for screening for other viral diseases: Secondary | ICD-10-CM

## 2016-11-27 DIAGNOSIS — Z5181 Encounter for therapeutic drug level monitoring: Secondary | ICD-10-CM

## 2016-11-27 DIAGNOSIS — N419 Inflammatory disease of prostate, unspecified: Secondary | ICD-10-CM | POA: Diagnosis not present

## 2016-11-27 DIAGNOSIS — I10 Essential (primary) hypertension: Secondary | ICD-10-CM | POA: Diagnosis not present

## 2016-11-27 DIAGNOSIS — Z8639 Personal history of other endocrine, nutritional and metabolic disease: Secondary | ICD-10-CM | POA: Diagnosis not present

## 2016-11-27 DIAGNOSIS — I4821 Permanent atrial fibrillation: Secondary | ICD-10-CM

## 2016-11-27 DIAGNOSIS — E782 Mixed hyperlipidemia: Secondary | ICD-10-CM | POA: Diagnosis not present

## 2016-11-27 MED ORDER — LISINOPRIL 40 MG PO TABS: 40.0000 mg | ORAL_TABLET | ORAL | 3 refills | Status: DC

## 2016-11-27 MED ORDER — ATORVASTATIN CALCIUM 40 MG PO TABS: 40.0000 mg | ORAL_TABLET | Freq: Every day | ORAL | 3 refills | Status: DC

## 2016-11-27 NOTE — Progress Notes (Signed)
Pre visit review using our clinic review tool, if applicable. No additional management support is needed unless otherwise documented below in the visit note. 

## 2016-11-27 NOTE — Progress Notes (Signed)
[facility] [address], [place], [place] [address] [phone]  Date:  11/27/2016   Name:  Arthur Rich   DOB:  03/21/1951   MRN:  [id number]  PCP:  Lamar Blinks, MD    Chief Complaint: Establish Care (Pt here to est care. Will need refills soon but not today. Pt thinks he recently had the flu. )   History of Present Illness:  Arthur Rich is a 66 y.o. very pleasant male patient who presents with the following:  Was seeing Arthur Rich at [facility].  His provider gave up primary care.    Has an appointment with Cardiology next week, with Arthur Rich.  His Afib is rate controlled.  He has had A fib persistently for about 5 years, intermittently prior to that.  He does feel that his a fib effects his quality of life and energy level somewhat He did have a cath and a stent in LAD back in 2009.   Is using CPAP that is working so-so.  Has not used the past week, because he has been sick.  He thinks that he sleeps just as well without the CPAP, but will continue to use.  Has umbilical hernia.  In it's current state for 12 years. It does not hurt but he can no longer reduce it.  He does not like the way it looks and has thought about having it repaired  Has lost about 6 pounds in the last month.  Walks 2 miles or rides stationary bike for 30 minutes, 4-5 times per week.  Is using portion control to reduce intake.  Has been through cardiac rehab.   Would like to get down to 250lbs.  During high heat times, he has to watch his exposure.  Has had 1 syncopal episode and 1 pre-syncopal episode (almost 3 years ago).    Had flu shot before Christmas. Has not had a PNA vaccine. Would like to do today Thinks that he has a tetanus shot in last 10 years, but not tdap.  Open to getting Tdap duu to young children in his family. Last colonoscopy 5-8 years @ [facility].  Study was clear at that time.  Will need refills for  Lisinopril and Lipitor.    Will get woozy occasionally.  Denies normal SOB or CP.  Occasional SOB, with long walking and exertion.  Last fever about 2 days ago with recent flu-like illness.  He is feeling better now and thinks he is on the mend.  No sx of acute illness at this time  NOT fasting today  Patient Active Problem List   Diagnosis Date Noted  . Obesity (BMI 30-39.9) 09/28/2015  . Vertigo 07/06/2015  . OSA (obstructive sleep apnea) 04/10/2015  . Excessive daytime sleepiness 04/10/2015  . Persistent atrial fibrillation ([place]) 10/25/2014  . Anticoagulation adequate with anticoagulant therapy 07/20/2013  . Umbilical hernia [id number]  . SYNCOPE 05/01/2010  . HLD (hyperlipidemia) 04/30/2010  . ESSENTIAL HYPERTENSION, BENIGN 04/30/2010  . Coronary atherosclerosis 04/30/2010  . ATRIAL FLUTTER 04/30/2010    Past Medical History:  Diagnosis Date  . Atrial flutter ([place])   . Cancer ([place])    skin  . Coronary artery disease    PCI LAD with BMS 9/09  . Hyperlipidemia   . Hypertension   . Obesity (BMI 30-39.9) 09/28/2015  . Overweight(278.02)   . Persistent atrial fibrillation ([place])   . Syncope and collapse 03/29/2010    Past  Surgical History:  Procedure Laterality Date  . CARDIAC CATHETERIZATION  9/09   PCI LAD (BMS)  . nasal septal surgery    . TONSILLECTOMY    . WISDOM TOOTH EXTRACTION      Social History  Substance Use Topics  . Smoking status: Former Smoker    Packs/day: 1.00    Years: 30.00    Types: Cigarettes    Quit date: 08/26/1996  . Smokeless tobacco: Never Used  . Alcohol use Yes     Comment: previously heavy, now trying to cut back    Family History  Problem Relation Age of Onset  . Diabetes Father   . Heart disease Father   . Heart attack Maternal Uncle     early 62's  . Cancer Maternal Grandmother     unknown ?leukemia    Allergies  Allergen Reactions  . Penicillins Other (See Comments)    Childhood - Rash    Medication list has been  reviewed and updated.  Current Outpatient Prescriptions on File Prior to Visit  Medication Sig Dispense Refill  . atorvastatin (LIPITOR) 40 MG tablet Take 40 mg by mouth daily before supper.    . cholecalciferol (VITAMIN D) 1000 UNITS tablet Take 2,000 Units by mouth daily.    Marland Kitchen lisinopril (PRINIVIL,ZESTRIL) 40 MG tablet Take 40 mg by mouth every morning.    . metoprolol succinate (TOPROL-XL) 100 MG 24 hr tablet Take 1 tablet (100 mg total) by mouth 2 (two) times daily. *Please call and schedule a one year follow up appointment* 60 tablet 0  . XARELTO 20 MG TABS tablet TAKE 1 TABLET BY MOUTH DAILY WITH SUPPER DISCONTINUE PRADAXA 30 tablet 4   No current facility-administered medications on file prior to visit.     Review of Systems:  As per HPI- otherwise negative.  No history of abdominal operation BP Readings from Last 3 Encounters:  11/27/16 (!) 163/90  01/10/16 132/82  09/28/15 130/84      Physical Examination: Vitals:   11/27/16 1619  BP: (!) 163/90  Pulse: 88  Temp: 98 F (36.7 C)   Vitals:   11/27/16 1619  Weight: 281 lb 12.8 oz (127.8 kg)  Height: 6\' 2"  (1.88 m)   Body mass index is 36.18 kg/m. Ideal Body Weight: Weight in (lb) to have BMI = 25: 194.3  GEN: WDWN, NAD, Non-toxic, A & O x 3, obese, large build HEENT: Atraumatic, Normocephalic. Neck supple. No masses, No LAD.  Bilateral TM wnl, oropharynx normal.  PEERL,EOMI.   Ears and Nose: No external deformity. CV: RRR, No M/G/R. No JVD. No thrill. No extra heart sounds. PULM: CTA B, no wheezes, crackles, rhonchi. No retractions. No resp. distress. No accessory muscle use.  Benign lung exam ABD: S, NT, ND, +BS. No rebound. No HSM.  He has a baseball sized umbilical hernia that is not reduceable in clinic.  It is non tender EXTR: No c/c/e NEURO Normal gait.  PSYCH: Normally interactive. Conversant. Not depressed or anxious appearing.  Calm demeanor.    Assessment and Plan: Essential hypertension - Plan:  lisinopril (PRINIVIL,ZESTRIL) 40 MG tablet, POCT Urinalysis Dipstick (Automated)  Mixed hyperlipidemia - Plan: Lipid panel, atorvastatin (LIPITOR) 40 MG tablet  History of elevated glucose - Plan: Comprehensive metabolic panel, Hemoglobin A1c  Immunization due - Plan: Tdap vaccine greater than or equal to 7yo IM, Pneumococcal conjugate vaccine [id number] IM  Umbilical hernia without obstruction and without gangrene  Encounter for hepatitis C screening test for low risk patient -  Plan: Hepatitis C antibody  Screening for prostate cancer - Plan: PSA  Screening for deficiency anemia - Plan: CBC  Permanent atrial fibrillation (HCC)  Medication monitoring encounter - Plan: CBC  Prostatitis, unspecified prostatitis type - Plan: PSA  Here today to establish care and discuss a few concerns Update tdap and prevnar 13 today Hep C screening abs as above Refilled his lisinopril For the time being he is watching his hernia but will let me know if he desires a referral to general surgery Of note pt had mentioned a urine dip (not for sx but because he thought he should have one with a physical) - however this was not done  Will alert pt of same with his other labs   Signed Lamar Blinks, MD

## 2016-11-27 NOTE — Patient Instructions (Signed)
We will get your labs back in the next couple of days and I will be in touch with your results You can have your tetanus and pneumonia shots today- pneumonia booster in one year If you do want a referral to general surgery regarding your hernia just let me know

## 2016-11-28 ENCOUNTER — Encounter: Payer: Self-pay | Admitting: Family Medicine

## 2016-11-28 ENCOUNTER — Telehealth: Payer: Self-pay | Admitting: *Deleted

## 2016-11-28 DIAGNOSIS — E119 Type 2 diabetes mellitus without complications: Secondary | ICD-10-CM | POA: Insufficient documentation

## 2016-11-28 LAB — COMPREHENSIVE METABOLIC PANEL
ALT: 20 U/L (ref 0–53)
AST: 36 U/L (ref 0–37)
Albumin: 4.2 g/dL (ref 3.5–5.2)
Alkaline Phosphatase: 69 U/L (ref 39–117)
BUN: 19 mg/dL (ref 6–23)
CO2: 24 mEq/L (ref 19–32)
Calcium: 9.3 mg/dL (ref 8.4–10.5)
Chloride: 104 mEq/L (ref 96–112)
Creatinine, Ser: 1.05 mg/dL (ref 0.40–1.50)
GFR: 75.27 mL/min (ref >60.00)
Glucose, Bld: 112 mg/dL — ABNORMAL HIGH (ref 70–99)
Potassium: 4.5 mEq/L (ref 3.5–5.1)
Sodium: 138 mEq/L (ref 135–145)
Total Bilirubin: 1.3 mg/dL — ABNORMAL HIGH (ref 0.2–1.2)
Total Protein: 7.1 g/dL (ref 6.0–8.3)

## 2016-11-28 LAB — PSA: PSA: 0.33 ng/mL (ref 0.10–4.00)

## 2016-11-28 LAB — CBC
HCT: 44.3 % (ref 39.0–52.0)
Hemoglobin: 14.9 g/dL (ref 13.0–17.0)
MCHC: 33.7 g/dL (ref 30.0–36.0)
MCV: 90.1 fl (ref 78.0–100.0)
Platelets: 196 10*3/uL (ref 150.0–400.0)
RBC: 4.92 Mil/uL (ref 4.22–5.81)
RDW: 13.5 % (ref 11.5–15.5)
WBC: 4.9 10*3/uL (ref 4.0–10.5)

## 2016-11-28 LAB — LIPID PANEL
Cholesterol: 183 mg/dL (ref 0–200)
HDL: 36.2 mg/dL — ABNORMAL LOW (ref >39.00)
NonHDL: 147.21
Total CHOL/HDL Ratio: 5
Triglycerides: 249 mg/dL — ABNORMAL HIGH (ref 0.0–149.0)
VLDL: 49.8 mg/dL — ABNORMAL HIGH (ref 0.0–40.0)

## 2016-11-28 LAB — LDL CHOLESTEROL, DIRECT: Direct LDL: 107 mg/dL

## 2016-11-28 LAB — HEMOGLOBIN A1C: Hgb A1c MFr Bld: 6.5 % (ref 4.6–6.5)

## 2016-11-28 LAB — HEPATITIS C ANTIBODY: HCV Ab: NEGATIVE

## 2016-11-28 NOTE — Telephone Encounter (Signed)
Lmtcb and to ask for scheduler. Pt was scheduled yesterday 11/27/16 by scheduling to see Brynda Rim. PA on 12/05/16. Provider is in an RIE all next week. Pt needs to be rescheduled.

## 2016-12-01 ENCOUNTER — Encounter: Payer: Self-pay | Admitting: Emergency Medicine

## 2016-12-05 ENCOUNTER — Ambulatory Visit: Payer: Self-pay | Admitting: Physician Assistant

## 2016-12-15 ENCOUNTER — Other Ambulatory Visit: Payer: Self-pay | Admitting: Interventional Cardiology

## 2016-12-26 ENCOUNTER — Encounter: Payer: Self-pay | Admitting: *Deleted

## 2016-12-26 ENCOUNTER — Ambulatory Visit (INDEPENDENT_AMBULATORY_CARE_PROVIDER_SITE_OTHER): Payer: Medicare Other | Admitting: Physician Assistant

## 2016-12-26 ENCOUNTER — Encounter: Payer: Self-pay | Admitting: Physician Assistant

## 2016-12-26 VITALS — BP 130/80 | HR 85 | Ht 74.0 in | Wt 279.1 lb

## 2016-12-26 DIAGNOSIS — E782 Mixed hyperlipidemia: Secondary | ICD-10-CM | POA: Diagnosis not present

## 2016-12-26 DIAGNOSIS — G4733 Obstructive sleep apnea (adult) (pediatric): Secondary | ICD-10-CM | POA: Diagnosis not present

## 2016-12-26 DIAGNOSIS — I482 Chronic atrial fibrillation: Secondary | ICD-10-CM

## 2016-12-26 DIAGNOSIS — I10 Essential (primary) hypertension: Secondary | ICD-10-CM

## 2016-12-26 DIAGNOSIS — R0602 Shortness of breath: Secondary | ICD-10-CM | POA: Diagnosis not present

## 2016-12-26 DIAGNOSIS — I4821 Permanent atrial fibrillation: Secondary | ICD-10-CM

## 2016-12-26 DIAGNOSIS — I2510 Atherosclerotic heart disease of native coronary artery without angina pectoris: Secondary | ICD-10-CM | POA: Diagnosis not present

## 2016-12-26 MED ORDER — METOPROLOL SUCCINATE ER 100 MG PO TB24: 100.0000 mg | ORAL_TABLET | Freq: Two times a day (BID) | ORAL | 3 refills | Status: DC

## 2016-12-26 MED ORDER — ATORVASTATIN CALCIUM 80 MG PO TABS: 80.0000 mg | ORAL_TABLET | Freq: Every day | ORAL | 3 refills | Status: DC

## 2016-12-26 NOTE — Patient Instructions (Addendum)
Medication Instructions:  1. INCREASE LIPITOR TO 80 MG DAILY; NEW RX HAS BEEN SENT IN 2. A REFILL FOR METOPROLOL HAS BEEN SENT IN  Labwork: IN 3 MONTHS YOU WILL NEED TO HAVE FASTING LIPID AND LIVER PANEL  Testing/Procedures: 1. Your physician has requested that you have an echocardiogram. Echocardiography is a painless test that uses sound waves to create images of your heart. It provides your doctor with information about the size and shape of your heart and how well your heart's chambers and valves are working. This procedure takes approximately one hour. There are no restrictions for this procedure.  2. Your physician has recommended that you wear an event monitor. Event monitors are medical devices that record the heart's electrical activity. Doctors most often [place] these monitors to diagnose arrhythmias. Arrhythmias are problems with the speed or rhythm of the heartbeat. The monitor is a small, portable device. You can wear one while you do your normal daily activities. This is usually used to diagnose what is causing palpitations/syncope (passing out).  3. Your physician has requested that you have a lexiscan myoview. For further information please visit [email or web address]. Please follow instruction sheet, as given.  Follow-Up: 4 MONTHS WITH DR. [name]   Any Other Special Instructions Will Be Listed Below (If Applicable).  If you need a refill on your cardiac medications before your next appointment, please call your pharmacy.

## 2016-12-26 NOTE — Progress Notes (Signed)
Cardiology Office Note:    Date:  12/26/2016   ID:  Arthur Rich, DOB Dec 27, 1950, MRN [id number]  PCP:  Lamar Blinks, MD  Cardiologist:  Dr. Daneen Schick   Electrophysiologist:  Dr. Thompson Grayer / [facility] Sleep med: Dr. Fransico Him   Referring MD: Chevis Pretty, MD   Chief Complaint  Patient presents with  . Follow-up    CAD, atrial fibrillation    History of Present Illness:    Arthur Rich is a 66 y.o. male with a hx of persistent atrial fibrillation, CAD status post remote BMS to the LAD in 2009, OSA, HL, obesity, prior A flutter.  Last seen by me in 3/17.  He returns for follow up.    He is here alone. He continues to note occasional episodes of lightheadedness. This is usually associated with nausea. He has been having this symptom for years without significant change. He wore a Holter monitor in 2016 without significant findings. He denies frank syncope. He denies chest pain. He does note gradually increasing fatigue and shortness of breath. He denies orthopnea, PND or edema. He denies any bleeding issues. He usually exercises several times a week.  Prior CV studies:   The following studies were reviewed today:  Holter 9/16 AFib, avg HR 70, rare HR up to 150, rare brady with 35 bpm and 3 sec pause during HS  Echo 10/14 EF 55-65%, mild MR, mod LAE, PASP 31 mmHg  LHC 9/09 BMS to pLAD  Past Medical History:  Diagnosis Date  . Atrial flutter ([place])   . Cancer ([place])    skin  . Coronary artery disease involving native coronary artery of native heart without angina pectoris 04/30/2010   LHC 9/09:  S/p BMS to pLAD   . Hyperlipidemia   . Hypertension   . Obesity (BMI 30-39.9) 09/28/2015  . Overweight(278.02)   . Permanent atrial fibrillation ([place]) 10/25/2014   Echo 10/14: EF 55-65%, mild MR, mod LAE, PASP 31 mmHg // Holter 9/16:  AFib, avg HR 70, rare HR up to 150, rare brady with 35 bpm and 3 sec pause during HS  . Syncope and collapse 03/29/2010     Past Surgical History:  Procedure Laterality Date  . CARDIAC CATHETERIZATION  9/09   PCI LAD (BMS)  . nasal septal surgery    . TONSILLECTOMY    . WISDOM TOOTH EXTRACTION      Current Medications: Current Meds  Medication Sig  . cholecalciferol (VITAMIN D) 1000 UNITS tablet Take 2,000 Units by mouth daily.  Marland Kitchen lisinopril (PRINIVIL,ZESTRIL) 40 MG tablet Take 1 tablet (40 mg total) by mouth every morning.  . metoprolol succinate (TOPROL-XL) 100 MG 24 hr tablet Take 1 tablet (100 mg total) by mouth 2 (two) times daily.  [name] 20 MG TABS tablet TAKE 1 TABLET BY MOUTH DAILY WITH SUPPER DISCONTINUE PRADAXA  . [DISCONTINUED] atorvastatin (LIPITOR) 40 MG tablet Take 1 tablet (40 mg total) by mouth daily before supper.  . [DISCONTINUED] metoprolol succinate (TOPROL-XL) 100 MG 24 hr tablet Take 1 tablet (100 mg total) by mouth 2 (two) times daily.     Allergies:   Penicillins   Social History   Social History  . Marital status: Single    Spouse name: N/A  . Number of children: N/A  . Years of education: N/A   Social History Main Topics  . Smoking status: Former Smoker    Packs/day: 1.00    Years: 30.00    Types: Cigarettes  Quit date: 08/26/1996  . Smokeless tobacco: Never Used  . Alcohol use Yes     Comment: previously heavy, now trying to cut back  . Drug use: No  . Sexual activity: Not Asked   Other Topics Concern  . None   Social History Narrative  . None     Family History  Problem Relation Age of Onset  . Diabetes Father   . Heart disease Father   . Heart attack Maternal Uncle     early 46's  . Cancer Maternal Grandmother     unknown ?leukemia     ROS:   Please see the history of present illness.    Review of Systems  Respiratory: Positive for shortness of breath.    All other systems reviewed and are negative.   EKGs/Labs/Other Test Reviewed:    EKG:  EKG is  ordered today.  The ekg ordered today demonstrates Atrial fibrillation, HR 85, no  significant change compared to prior tracings  Recent Labs: 11/27/2016: ALT 20; BUN 19; Creatinine, Ser 1.05; Hemoglobin 14.9; Platelets 196.0; Potassium 4.5; Sodium 138   Recent Lipid Panel    Component Value Date/Time   CHOL 183 11/27/2016 1733   TRIG 249.0 (H) 11/27/2016 1733   HDL 36.20 (L) 11/27/2016 1733   CHOLHDL 5 11/27/2016 1733   VLDL 49.8 (H) 11/27/2016 1733   LDLCALC 113 (H) 09/06/2013 1417   LDLDIRECT 107.0 11/27/2016 1733     Physical Exam:    VS:  BP 130/80   Pulse 85   Ht 6\' 2"  (1.88 m)   Wt 279 lb 1.9 oz (126.6 kg)   BMI 35.84 kg/m     Wt Readings from Last 3 Encounters:  12/26/16 279 lb 1.9 oz (126.6 kg)  11/27/16 281 lb 12.8 oz (127.8 kg)  01/10/16 273 lb 12.8 oz (124.2 kg)     Physical Exam  Constitutional: He is oriented to person, place, and time. He appears well-developed and well-nourished. No distress.  HENT:  Head: Normocephalic and atraumatic.  Eyes: No scleral icterus.  Neck: Normal range of motion. No JVD present. Carotid bruit is not present.  Cardiovascular: Normal rate, S1 normal and S2 normal.  An irregularly irregular rhythm present.  No murmur heard. Pulmonary/Chest: Effort normal and breath sounds normal. He has no wheezes. He has no rhonchi. He has no rales.  Abdominal: Soft. There is no tenderness.  Musculoskeletal: He exhibits no edema.  Neurological: He is alert and oriented to person, place, and time.  Skin: Skin is warm and dry.  Psychiatric: He has a normal mood and affect.    ASSESSMENT:    1. Shortness of breath   2. Coronary artery disease involving native coronary artery of native heart without angina pectoris   3. Permanent atrial fibrillation ([place])   4. Essential hypertension, benign   5. Mixed hyperlipidemia   6. OSA (obstructive sleep apnea)    PLAN:    In order of problems listed above:  1. Shortness of breath He notes mild shortness of breath with activity and gradually worsening fatigue over time. He  questioned whether or not he should be taking an antiarrhythmic like Tikosyn. He has a friend who was recently placed on Tikosyn, and now he feels much better. In reviewing his chart, he had stenting of the LAD in 2009 after an abnormal stress test which was prompted by atrial flutter noted prior to a colonoscopy. He was felt to have silent ischemia. He has never had chest pain.  It has been a long time since he has been assessed for ischemia  -  Arrange Lexiscan Myoview  -  Arrange echocardiogram  2. Coronary artery disease involving native coronary artery of native heart without angina pectoris As noted, I will proceed with stress testing to rule out ischemia. He is not on aspirin as he is on Xarelto. Continue statin, beta blocker.  3. Permanent atrial fibrillation (HCC) Heart rate is controlled. We discussed the pros and cons of rate control versus rhythm control. He previously had a moderately enlarged left atrium. I am not convinced that he is a candidate for rhythm control. I will obtain an echocardiogram. If his left atrial size is not that great, we could consider sending him back to Dr. [name] to discuss further options for his atrial fibrillation. Overall, I believe that he is asymptomatic. At this point, we discussed the fact that he is likely a candidate for continued rate control therapy. Recent BMET and CBC with his PCP was normal.  4. Essential hypertension, benign Blood pressure is controlled.  5. Mixed hyperlipidemia Continue statin. Last LDL was 107. I will increase his Lipitor to 80 mg daily and check lipids and LFTs in 3 months  6. OSA (obstructive sleep apnea) He uses his CPAP intermittently. I have encouraged him to use this on a regular basis.  Dispo:  Return in about 4 months (around 04/27/2017) for Routine Follow Up with Dr. [name] or me.   Medication Adjustments/Labs and Tests Ordered: Current medicines are reviewed at length with the patient today.  Concerns regarding  medicines are outlined above.  Medication changes, Labs and Tests ordered today are outlined in the Patient Instructions noted below. Patient Instructions  Medication Instructions:  1. INCREASE LIPITOR TO 80 MG DAILY; NEW RX HAS BEEN SENT IN 2. A REFILL FOR METOPROLOL HAS BEEN SENT IN  Labwork: IN 3 MONTHS YOU WILL NEED TO HAVE FASTING LIPID AND LIVER PANEL  Testing/Procedures: 1. Your physician has requested that you have an echocardiogram. Echocardiography is a painless test that uses sound waves to create images of your heart. It provides your doctor with information about the size and shape of your heart and how well your heart's chambers and valves are working. This procedure takes approximately one hour. There are no restrictions for this procedure.  2. Your physician has recommended that you wear an event monitor. Event monitors are medical devices that record the heart's electrical activity. Doctors most often [place] these monitors to diagnose arrhythmias. Arrhythmias are problems with the speed or rhythm of the heartbeat. The monitor is a small, portable device. You can wear one while you do your normal daily activities. This is usually used to diagnose what is causing palpitations/syncope (passing out).  3. Your physician has requested that you have a lexiscan myoview. For further information please visit [email or web address]tn. Please follow instruction sheet, as given.  Follow-Up: 4 MONTHS WITH DR. [name]   Any Other Special Instructions Will Be Listed Below (If Applicable).  If you need a refill on your cardiac medications before your next appointment, please call your pharmacy.  Signed, Richardson Dopp, PA-C  12/26/2016 1:56 PM    [facility] [address], [place], [place]  [address] Phone: [phone]; Fax: [phone]

## 2016-12-31 ENCOUNTER — Other Ambulatory Visit: Payer: Self-pay | Admitting: Interventional Cardiology

## 2017-01-22 ENCOUNTER — Telehealth (HOSPITAL_COMMUNITY): Payer: Self-pay | Admitting: *Deleted

## 2017-01-22 NOTE — Telephone Encounter (Signed)
Left message on voicemail per DPR in reference to upcoming appointment scheduled on 01/27/17 with detailed instructions given per Myocardial Perfusion Study Information Sheet for the test. LM to arrive 15 minutes early, and that it is imperative to arrive on time for appointment to keep from having the test rescheduled. If you need to cancel or reschedule your appointment, please call the office within 24 hours of your appointment. Failure to do so may result in a cancellation of your appointment, and a $50 no show fee. Phone number given for call back for any questions. Kirstie Peri

## 2017-01-27 ENCOUNTER — Other Ambulatory Visit: Payer: Self-pay

## 2017-01-27 ENCOUNTER — Ambulatory Visit (HOSPITAL_COMMUNITY): Payer: Medicare Other | Attending: Internal Medicine

## 2017-01-27 ENCOUNTER — Ambulatory Visit (HOSPITAL_BASED_OUTPATIENT_CLINIC_OR_DEPARTMENT_OTHER): Payer: Medicare Other

## 2017-01-27 DIAGNOSIS — I10 Essential (primary) hypertension: Secondary | ICD-10-CM | POA: Insufficient documentation

## 2017-01-27 DIAGNOSIS — E785 Hyperlipidemia, unspecified: Secondary | ICD-10-CM | POA: Diagnosis not present

## 2017-01-27 DIAGNOSIS — I2510 Atherosclerotic heart disease of native coronary artery without angina pectoris: Secondary | ICD-10-CM | POA: Insufficient documentation

## 2017-01-27 DIAGNOSIS — G4733 Obstructive sleep apnea (adult) (pediatric): Secondary | ICD-10-CM | POA: Insufficient documentation

## 2017-01-27 DIAGNOSIS — I071 Rheumatic tricuspid insufficiency: Secondary | ICD-10-CM | POA: Diagnosis not present

## 2017-01-27 DIAGNOSIS — R0602 Shortness of breath: Secondary | ICD-10-CM

## 2017-01-27 DIAGNOSIS — I4891 Unspecified atrial fibrillation: Secondary | ICD-10-CM | POA: Diagnosis not present

## 2017-01-27 DIAGNOSIS — I371 Nonrheumatic pulmonary valve insufficiency: Secondary | ICD-10-CM | POA: Diagnosis not present

## 2017-01-27 MED ORDER — TECHNETIUM TC 99M TETROFOSMIN IV KIT
32.3000 | PACK | Freq: Once | INTRAVENOUS | Status: AC | PRN
  Administered 2017-01-27: 32.3 via INTRAVENOUS
  Filled 2017-01-27: qty 33

## 2017-01-27 MED ORDER — REGADENOSON 0.4 MG/5ML IV SOLN
0.4000 mg | Freq: Once | INTRAVENOUS | Status: AC
  Administered 2017-01-27: 0.4 mg via INTRAVENOUS

## 2017-01-28 ENCOUNTER — Ambulatory Visit (INDEPENDENT_AMBULATORY_CARE_PROVIDER_SITE_OTHER): Payer: Medicare Other

## 2017-01-28 ENCOUNTER — Encounter: Payer: Self-pay | Admitting: Physician Assistant

## 2017-01-28 ENCOUNTER — Telehealth: Payer: Self-pay | Admitting: *Deleted

## 2017-01-28 ENCOUNTER — Ambulatory Visit (HOSPITAL_COMMUNITY): Payer: Medicare Other | Attending: Cardiology

## 2017-01-28 ENCOUNTER — Other Ambulatory Visit: Payer: Self-pay | Admitting: Physician Assistant

## 2017-01-28 ENCOUNTER — Telehealth: Payer: Self-pay | Admitting: Physician Assistant

## 2017-01-28 DIAGNOSIS — R42 Dizziness and giddiness: Secondary | ICD-10-CM

## 2017-01-28 DIAGNOSIS — R5383 Other fatigue: Secondary | ICD-10-CM | POA: Diagnosis not present

## 2017-01-28 DIAGNOSIS — I482 Chronic atrial fibrillation: Secondary | ICD-10-CM | POA: Diagnosis not present

## 2017-01-28 DIAGNOSIS — R0602 Shortness of breath: Secondary | ICD-10-CM | POA: Diagnosis not present

## 2017-01-28 DIAGNOSIS — I4821 Permanent atrial fibrillation: Secondary | ICD-10-CM

## 2017-01-28 LAB — MYOCARDIAL PERFUSION IMAGING
Peak HR: 142 {beats}/min
RATE: 0.34
Rest HR: 86 {beats}/min
SDS: 2
SRS: 2
SSS: 4
TID: 0.93

## 2017-01-28 MED ORDER — TECHNETIUM TC 99M TETROFOSMIN IV KIT
32.8000 | PACK | Freq: Once | INTRAVENOUS | Status: AC | PRN
  Administered 2017-01-28: 32.8 via INTRAVENOUS
  Filled 2017-01-28: qty 33

## 2017-01-28 NOTE — Telephone Encounter (Signed)
No answer. Tried to reach pt to go over Myoview results

## 2017-01-28 NOTE — Telephone Encounter (Signed)
-----   Message from Liliane Shi, [place] sent at 01/28/2017  5:06 PM EDT ----- Please call the patient. The stress test is normal.  Please fax a copy of this study result to his PCP:  Lamar Blinks, MD  Thanks! Richardson Dopp, PA-C    01/28/2017 5:05 PM

## 2017-01-28 NOTE — Telephone Encounter (Signed)
Paged by heart monitor company informing the patient is in atrial fibrillation with heart rate of 80. Upon review of recent office note, however, it appears patient has permanent atrial fibrillation and on Xarelto at home. He just got the monitor today. I did not see where in the recent office note that a heart monitor was suggested. I will inform our cardiology PA who saw the patient recently.  Hilbert Corrigan PA Pager: [phone]

## 2017-01-29 NOTE — Telephone Encounter (Signed)
Event monitor obtained to evaluate shortness of breath and dizziness. Prior holter did not demonstrate any abnormal findings but symptoms continued.  Patient with known atrial fibrillation with rate control on anticoagulation.   Richardson Dopp, PA-C    01/29/2017 10:20 AM

## 2017-01-30 ENCOUNTER — Telehealth: Payer: Self-pay

## 2017-01-30 NOTE — Telephone Encounter (Signed)
[employer] faxed rhythm from patient's monitor for episode on 01/30/17 at 0249. Shows atrial fibrillation, pause 3 second. Patient reported sleeping and having a nightmare at the time. Patient is asymptomatic. Patient is currently on xarelto 20 mg and metoprolol 100 mg BID.  Reviewed by DOD, Dr. Angelena Form, no new orders. Patient to follow up with Dr. [name].

## 2017-02-01 NOTE — Telephone Encounter (Signed)
Ok. Continue to monitor. Continue with current treatment plan. Richardson Dopp, PA-C   02/01/2017 7:51 PM

## 2017-02-03 ENCOUNTER — Telehealth: Payer: Self-pay | Admitting: *Deleted

## 2017-02-03 NOTE — Telephone Encounter (Signed)
Pt notified of echo and myoview results by phone with verbal understanding. Pt aware to continue on current Tx plan. I will fax a copy of results to PCP. Pt thanked me for my call today.

## 2017-02-03 NOTE — Telephone Encounter (Signed)
-----   Message from Liliane Shi, [place] sent at 01/28/2017  5:06 PM EDT ----- Please call the patient. The stress test is normal.  Please fax a copy of this study result to his PCP:  Lamar Blinks, MD  Thanks! Richardson Dopp, PA-C    01/28/2017 5:05 PM

## 2017-02-11 DIAGNOSIS — D225 Melanocytic nevi of trunk: Secondary | ICD-10-CM | POA: Diagnosis not present

## 2017-02-11 DIAGNOSIS — D1801 Hemangioma of skin and subcutaneous tissue: Secondary | ICD-10-CM | POA: Diagnosis not present

## 2017-02-11 DIAGNOSIS — L814 Other melanin hyperpigmentation: Secondary | ICD-10-CM | POA: Diagnosis not present

## 2017-02-11 DIAGNOSIS — L821 Other seborrheic keratosis: Secondary | ICD-10-CM | POA: Diagnosis not present

## 2017-02-11 DIAGNOSIS — Z85828 Personal history of other malignant neoplasm of skin: Secondary | ICD-10-CM | POA: Diagnosis not present

## 2017-03-05 DIAGNOSIS — J209 Acute bronchitis, unspecified: Secondary | ICD-10-CM | POA: Diagnosis not present

## 2017-03-05 DIAGNOSIS — J069 Acute upper respiratory infection, unspecified: Secondary | ICD-10-CM | POA: Diagnosis not present

## 2017-03-05 DIAGNOSIS — R062 Wheezing: Secondary | ICD-10-CM | POA: Diagnosis not present

## 2017-03-09 ENCOUNTER — Ambulatory Visit (INDEPENDENT_AMBULATORY_CARE_PROVIDER_SITE_OTHER): Payer: Medicare Other | Admitting: Family Medicine

## 2017-03-09 ENCOUNTER — Encounter: Payer: Self-pay | Admitting: Family Medicine

## 2017-03-09 ENCOUNTER — Ambulatory Visit (HOSPITAL_BASED_OUTPATIENT_CLINIC_OR_DEPARTMENT_OTHER)
Admission: RE | Admit: 2017-03-09 | Discharge: 2017-03-09 | Disposition: A | Discharge status: Home / Self Care | Payer: Medicare Other | Source: Ambulatory Visit | Attending: Family Medicine | Admitting: Family Medicine

## 2017-03-09 VITALS — BP 132/84 | HR 85 | Temp 97.8°F | Ht 74.0 in | Wt 279.0 lb

## 2017-03-09 DIAGNOSIS — R81 Glycosuria: Secondary | ICD-10-CM

## 2017-03-09 DIAGNOSIS — R059 Cough, unspecified: Secondary | ICD-10-CM

## 2017-03-09 DIAGNOSIS — R310 Gross hematuria: Secondary | ICD-10-CM | POA: Diagnosis not present

## 2017-03-09 DIAGNOSIS — R8299 Other abnormal findings in urine: Secondary | ICD-10-CM

## 2017-03-09 DIAGNOSIS — R05 Cough: Secondary | ICD-10-CM

## 2017-03-09 DIAGNOSIS — Z87891 Personal history of nicotine dependence: Secondary | ICD-10-CM

## 2017-03-09 DIAGNOSIS — R739 Hyperglycemia, unspecified: Secondary | ICD-10-CM | POA: Diagnosis not present

## 2017-03-09 DIAGNOSIS — I2510 Atherosclerotic heart disease of native coronary artery without angina pectoris: Secondary | ICD-10-CM

## 2017-03-09 DIAGNOSIS — R82998 Other abnormal findings in urine: Secondary | ICD-10-CM

## 2017-03-09 LAB — HEMOGLOBIN A1C: Hgb A1c MFr Bld: 6.6 % — ABNORMAL HIGH (ref 4.6–6.5)

## 2017-03-09 LAB — COMPREHENSIVE METABOLIC PANEL
ALT: 20 U/L (ref 0–53)
AST: 19 U/L (ref 0–37)
Albumin: 4.1 g/dL (ref 3.5–5.2)
Alkaline Phosphatase: 68 U/L (ref 39–117)
BUN: 14 mg/dL (ref 6–23)
CO2: 27 mEq/L (ref 19–32)
Calcium: 9.7 mg/dL (ref 8.4–10.5)
Chloride: 104 mEq/L (ref 96–112)
Creatinine, Ser: 0.86 mg/dL (ref 0.40–1.50)
GFR: 94.69 mL/min (ref >60.00)
Glucose, Bld: 118 mg/dL — ABNORMAL HIGH (ref 70–99)
Potassium: 4 mEq/L (ref 3.5–5.1)
Sodium: 139 mEq/L (ref 135–145)
Total Bilirubin: 0.7 mg/dL (ref 0.2–1.2)
Total Protein: 7.6 g/dL (ref 6.0–8.3)

## 2017-03-09 LAB — CK: Total CK: 65 U/L (ref 7–232)

## 2017-03-09 LAB — POCT URINALYSIS DIP (MANUAL ENTRY)
Bilirubin, UA: NEGATIVE
Blood, UA: NEGATIVE
Glucose, UA: 500 mg/dL — AB
Ketones, POC UA: NEGATIVE mg/dL
Leukocytes, UA: NEGATIVE
Nitrite, UA: NEGATIVE
Protein Ur, POC: NEGATIVE mg/dL
Spec Grav, UA: >=1.03 — AB (ref 1.010–1.025)
Urobilinogen, UA: 0.2 E.U./dL
pH, UA: 6 (ref 5.0–8.0)

## 2017-03-09 LAB — CBC
HCT: 42.2 % (ref 39.0–52.0)
Hemoglobin: 14 g/dL (ref 13.0–17.0)
MCHC: 33.2 g/dL (ref 30.0–36.0)
MCV: 91 fl (ref 78.0–100.0)
Platelets: 351 10*3/uL (ref 150.0–400.0)
RBC: 4.64 Mil/uL (ref 4.22–5.81)
RDW: 13.5 % (ref 11.5–15.5)
WBC: 12.6 10*3/uL — ABNORMAL HIGH (ref 4.0–10.5)

## 2017-03-09 NOTE — Patient Instructions (Addendum)
It was very nice to see you today!   We will get a chest x-ray today and I will be in touch with your results.  After your blood draw please go to imaging services on the ground floor next to the elevators  As long as your x-ray looks ok and you continue to get better I don't think you need the antibiotic I am going to have you see urology to discuss your possible blood in urine, and we will get labs for you today  You may also want to think about getting a low dose Chest CT to screen for lung cancer- I gave you some information about this test today for you to look over.  If you would like to get this test done, I am glad to schedule it for you

## 2017-03-09 NOTE — Progress Notes (Addendum)
Arthur Rich at [facility] [address] [place], [place] [address] [phone]  Date:  03/09/2017   Name:  Arthur Rich   DOB:  January 03, 1951   MRN:  [id number]  PCP:  Arthur Mclean, MD    Chief Complaint: Cough (c/o prod cough with yellow to brown mucus. Pt states that sx's first started about 10-12 days ago. )   History of Present Illness:  Arthur Rich is a 66 y.o. very pleasant male patient who presents with the following:  Last seen by myself in February-  HPI from that visit  Has an appointment with Cardiology next week, with Dr. Rayann Rich.  His Afib is rate controlled.  He has had A fib persistently for about 5 years, intermittently prior to that.  He does feel that his a fib effects his quality of life and energy level somewhat He did have a cath and a stent in LAD back in 2009.  Is using CPAP that is working so-so.  Has not used the past week, because he has been sick.  He thinks that he sleeps just as well without the CPAP, but will continue to use. Has umbilical hernia.  In it's current state for 12 years. It does not hurt but he can no longer reduce it.  He does not like the way it looks and has thought about having it repaired Has lost about 6 pounds in the last month.  Walks 2 miles or rides stationary bike for 30 minutes, 4-5 times per week.  Is using portion control to reduce intake.  Has been through cardiac rehab.   Would like to get down to 250lbs. During high heat times, he has to watch his exposure.  Has had 1 syncopal episode and 1 pre-syncopal episode (almost 3 years ago).  Here today with illness-  He noted onset of illness about 12 days ago. He first had a ST, then developed subjective fever, chills, aches.   He did go to an UC this past Friday (today is Monday)- he was given prednisone and albuterol, cough syrup.   pred taper 40 x3, 30 x3, 20 x3, 10 x 3- he is taking this taper now  They gave him a  doxycyline rx but he has not yet started on this He does feel that the prednisone is helping him some.  However he did wake up coughing last night a few times- however he feels that overall he has gotten better.  He was also given an rx for doxycycline but did not take it. He was not sure if he needed to use this   He did have prostatitis in the past but is not having any sx of that sort currently   He thinks that he has seen gross blood in his urine - the urine has sometimes looked red tinged/ brown.  This has been coming and going for a couple of years.    He is a former smoker- he quit 20 years ago but prior to that smoked 1 ppd from ages 68 to 42.    We did a PSA for him in February- 0.33  No CP,SOB, nausea, vomiting or diarrhea No rash   Wt Readings from Last 3 Encounters:  03/09/17 279 lb (126.6 kg)  01/27/17 279 lb (126.6 kg)  12/26/16 279 lb 1.9 oz (126.6 kg)     Patient Active Problem List   Diagnosis Date Noted  . Elevated glucose  11/28/2016  . Obesity (BMI 30-39.9) 09/28/2015  . Vertigo 07/06/2015  . OSA (obstructive sleep apnea) 04/10/2015  . Excessive daytime sleepiness 04/10/2015  . Permanent atrial fibrillation ([place]) 10/25/2014  . Anticoagulation adequate with anticoagulant therapy 07/20/2013  . Umbilical hernia 54/65/6812  . SYNCOPE 05/01/2010  . Mixed hyperlipidemia 04/30/2010  . ESSENTIAL HYPERTENSION, BENIGN 04/30/2010  . Coronary artery disease involving native coronary artery of native heart without angina pectoris 04/30/2010  . ATRIAL FLUTTER 04/30/2010    Past Medical History:  Diagnosis Date  . Atrial flutter ([place])   . Cancer ([place])    skin  . Coronary artery disease involving native coronary artery of native heart without angina pectoris 04/30/2010   LHC 9/09:  S/p BMS to pLAD // Myoview 4/18: No ischemia, low risk, not gated  . Hyperlipidemia   . Hypertension   . Obesity (BMI 30-39.9) 09/28/2015  . Overweight(278.02)   . Permanent atrial  fibrillation ([place]) 10/25/2014   Echo 10/14: EF 55-65%, mild MR, mod LAE, PASP 31 mmHg // Holter 9/16:  AFib, avg HR 70, rare HR up to 150, rare brady with 35 bpm and 3 sec pause during HS // Echo 4/18: EF 55-60, no RWMA, mild LAE, moderate RAE, trivial TR, PASP 28  . Syncope and collapse 03/29/2010    Past Surgical History:  Procedure Laterality Date  . CARDIAC CATHETERIZATION  9/09   PCI LAD ([facility])  . nasal septal surgery    . TONSILLECTOMY    . WISDOM TOOTH EXTRACTION      Social History  Substance Use Topics  . Smoking status: Former Smoker    Packs/day: 1.00    Years: 30.00    Types: Cigarettes    Quit date: 08/26/1996  . Smokeless tobacco: Never Used  . Alcohol use Yes     Comment: previously heavy, now trying to cut back    Family History  Problem Relation Age of Onset  . Diabetes Father   . Heart disease Father   . Heart attack Maternal Uncle        early 23's  . Cancer Maternal Grandmother        unknown ?leukemia    Allergies  Allergen Reactions  . Penicillins Other (See Comments)    Childhood - Rash    Medication list has been reviewed and updated.  Current Outpatient Prescriptions on File Prior to Visit  Medication Sig Dispense Refill  . atorvastatin (LIPITOR) 80 MG tablet Take 1 tablet (80 mg total) by mouth daily. 90 tablet 3  . cholecalciferol (VITAMIN D) 1000 UNITS tablet Take 2,000 Units by mouth daily.    Marland Kitchen lisinopril (PRINIVIL,ZESTRIL) 40 MG tablet Take 1 tablet (40 mg total) by mouth every morning. 90 tablet 3  . metoprolol succinate (TOPROL-XL) 100 MG 24 hr tablet Take 1 tablet (100 mg total) by mouth 2 (two) times daily. 180 tablet 3  . XARELTO 20 MG TABS tablet TAKE 1 TABLET BY MOUTH DAILY WITH SUPPER DISCONTINUE PRADAXA 30 tablet 5   No current facility-administered medications on file prior to visit.     Review of Systems:  As per HPI- otherwise negative.   Physical Examination: Vitals:   03/09/17 1122  BP: 132/84  Pulse: 85  Temp:  97.8 F (36.6 C)   Vitals:   03/09/17 1122  Weight: 279 lb (126.6 kg)  Height: 6\' 2"  (1.88 m)   Body mass index is 35.82 kg/m. Ideal Body Weight: Weight in (lb) to have BMI = 25: 194.3  GEN: WDWN, NAD, Non-toxic, A & O x 3 HEENT: Atraumatic, Normocephalic. Neck supple. No masses, No LAD. Ears and Nose: No external deformity. CV: rate controlled atrial fib- xarelto, No M/G/R. No JVD. No thrill. No extra heart sounds. PULM: CTA B, no wheezes, crackles, rhonchi. No retractions. No resp. distress. No accessory muscle use. ABD: S, NT, ND, +BS. No rebound. No HSM. EXTR: No c/c/e NEURO Normal gait.  PSYCH: Normally interactive. Conversant. Not depressed or anxious appearing.  Calm demeanor.   Results for orders placed or performed in visit on 03/09/17  POCT urinalysis dipstick  Result Value Ref Range   Color, UA yellow yellow   Clarity, UA clear clear   Glucose, UA =500 (A) negative mg/dL   Bilirubin, UA negative negative   Ketones, POC UA negative negative mg/dL   Spec Grav, UA >=1.030 (A) 1.010 - 1.025   Blood, UA negative negative   pH, UA 6.0 5.0 - 8.0   Protein Ur, POC negative negative mg/dL   Urobilinogen, UA 0.2 0.2 or 1.0 E.U./dL   Nitrite, UA Negative Negative   Leukocytes, UA Negative Negative   Dg Chest 2 View  Result Date: 03/09/2017 CLINICAL DATA:  Cough, congestion, and subjective fever. Former smoker. EXAM: CHEST  2 VIEW COMPARISON:  A 01/2008 FINDINGS: The cardiac silhouette is within normal limits in size. The thoracic aorta is slightly tortuous. The lungs are better inflated than on the prior study with minimal atelectasis or scarring in the lung bases. No segmental airspace consolidation, edema, pleural effusion, or pneumothorax is identified. Mild thoracic spondylosis is noted. IMPRESSION: No active cardiopulmonary disease. Electronically Signed   By: Logan Bores M.D.   On: 03/09/2017 12:03    Assessment and Plan: Cough - Plan: DG Chest 2 View  Gross  hematuria - Plan: POCT urinalysis dipstick, Ambulatory referral to Urology  Brown-colored urine - Plan: POCT urinalysis dipstick, CBC, CK (Creatine Kinase), Comprehensive metabolic panel, Ambulatory referral to Urology  History of smoking - Plan: DG Chest 2 View, Ambulatory referral to Urology  Glucose found in urine on examination - Plan: Hemoglobin A1c  Hyperglycemia - Plan: Hemoglobin A1c  Here today for a follow-up visit Chest x-ray is benign- sent copy of results to pt At this time no need for ABX as long as he is continuing to get better Urine is negative for blood today, but as pt thinks he has seen gross blood will refer to urology anyway as likely needs a bladder cancer rule- out eval.  Will check CMP and CK also  Noted glucose in urine today- pt states history of "pre-diabet3s."  Will get CMP and A1c today Will plan further follow- up pending labs.   Signed Lamar Blinks, MD  Received his labs and sent a mychart message  Results for orders placed or performed in visit on 03/09/17  CBC  Result Value Ref Range   WBC 12.6 (H) 4.0 - 10.5 K/uL   RBC 4.64 4.22 - 5.81 Mil/uL   Platelets 351.0 150.0 - 400.0 K/uL   Hemoglobin 14.0 13.0 - 17.0 g/dL   HCT 42.2 39.0 - 52.0 %   MCV 91.0 78.0 - 100.0 fl   MCHC 33.2 30.0 - 36.0 g/dL   RDW 13.5 11.5 - 15.5 %  CK (Creatine Kinase)  Result Value Ref Range   Total CK 65 7 - 232 U/L  Comprehensive metabolic panel  Result Value Ref Range   Sodium 139 135 - 145 mEq/L   Potassium 4.0 3.5 -  5.1 mEq/L   Chloride 104 96 - 112 mEq/L   CO2 27 19 - 32 mEq/L   Glucose, Bld 118 (H) 70 - 99 mg/dL   BUN 14 6 - 23 mg/dL   Creatinine, Ser 0.86 0.40 - 1.50 mg/dL   Total Bilirubin 0.7 0.2 - 1.2 mg/dL   Alkaline Phosphatase 68 39 - 117 U/L   AST 19 0 - 37 U/L   ALT 20 0 - 53 U/L   Total Protein 7.6 6.0 - 8.3 g/dL   Albumin 4.1 3.5 - 5.2 g/dL   Calcium 9.7 8.4 - 10.5 mg/dL   GFR 94.69 >60.00 mL/min  Hemoglobin A1c  Result Value Ref Range    Hgb A1c MFr Bld 6.6 (H) 4.6 - 6.5 %  POCT urinalysis dipstick  Result Value Ref Range   Color, UA yellow yellow   Clarity, UA clear clear   Glucose, UA =500 (A) negative mg/dL   Bilirubin, UA negative negative   Ketones, POC UA negative negative mg/dL   Spec Grav, UA >=1.030 (A) 1.010 - 1.025   Blood, UA negative negative   pH, UA 6.0 5.0 - 8.0   Protein Ur, POC negative negative mg/dL   Urobilinogen, UA 0.2 0.2 or 1.0 E.U./dL   Nitrite, UA Negative Negative   Leukocytes, UA Negative Negative

## 2017-03-10 ENCOUNTER — Encounter: Payer: Self-pay | Admitting: Family Medicine

## 2017-03-11 DIAGNOSIS — L821 Other seborrheic keratosis: Secondary | ICD-10-CM | POA: Diagnosis not present

## 2017-03-11 MED ORDER — METFORMIN HCL 500 MG PO TABS: 500.0000 mg | ORAL_TABLET | Freq: Every day | ORAL | 3 refills | Status: DC

## 2017-03-30 ENCOUNTER — Other Ambulatory Visit: Payer: Medicare Other | Admitting: *Deleted

## 2017-03-30 DIAGNOSIS — E782 Mixed hyperlipidemia: Secondary | ICD-10-CM | POA: Diagnosis not present

## 2017-03-30 LAB — HEPATIC FUNCTION PANEL
ALT: 19 IU/L (ref 0–44)
AST: 21 IU/L (ref 0–40)
Albumin: 4.1 g/dL (ref 3.6–4.8)
Alkaline Phosphatase: 78 IU/L (ref 39–117)
Bilirubin Total: 1.3 mg/dL — ABNORMAL HIGH (ref 0.0–1.2)
Bilirubin, Direct: 0.28 mg/dL (ref 0.00–0.40)
Total Protein: 6.6 g/dL (ref 6.0–8.5)

## 2017-03-30 LAB — LIPID PANEL
Chol/HDL Ratio: 4.3 ratio (ref 0.0–5.0)
Cholesterol, Total: 162 mg/dL (ref 100–199)
HDL: 38 mg/dL — ABNORMAL LOW (ref >39)
LDL Calculated: 72 mg/dL (ref 0–99)
Triglycerides: 261 mg/dL — ABNORMAL HIGH (ref 0–149)
VLDL Cholesterol Cal: 52 mg/dL — ABNORMAL HIGH (ref 5–40)

## 2017-03-31 ENCOUNTER — Telehealth: Payer: Self-pay | Admitting: *Deleted

## 2017-03-31 NOTE — Telephone Encounter (Signed)
Pt notified of lab results and findings by phone with verbal understanding. Pt advised to limit white flour foods, white rice,etc. and try to change over to more whole grains. Pt verbalized understanding to plan of care and thanked me for my call.

## 2017-03-31 NOTE — Telephone Encounter (Signed)
-----   Message from Liliane Shi, [place] sent at 03/31/2017 12:42 PM EDT ----- Please call the patient LDL at goal. Triglycerides mildly elevated - recommend close attention to diet to maintain triglycerides. Liver enzymes normal. Continue with current treatment plan. Richardson Dopp, PA-C   03/31/2017 12:40 PM

## 2017-04-11 NOTE — Progress Notes (Signed)
[facility] [facility] at [facility] [address], [place], [place] [address] [phone]  Date:  04/13/2017   Name:  Arthur Rich   DOB:  04-13-1951   MRN:  [id number]  PCP:  [name], MD    Chief Complaint: Foot Pain (c/o pain infeet that comes and oges. )   History of Present Illness:  Arthur Rich is a 66 y.o. very pleasant male patient who presents with the following:  Noted a pain in his left foot about 10 days ago.  He wondered if he might have gout Dx with Diabetes last month due to 2 A1c of higher than 6.5% Started on metformin 500 mg once a day- he is tolerating this fine  He has persistent A fib on rate control, CAD with remote stent to LAD in 2009, obesity, OSA  Lab Results  Component Value Date   HGBA1C 6.6 (H) 03/09/2017  due for foot exam today Due for eye exam- he will do this asap   He has had ?gout in his right big toe in the past.  This was just followed clinically and he never took any particular gout medications, sx went away He has noted a pain in the LEFT foot this time, started in the ball of the foot and affected his gait.  He may notice some tingling and numbness in both of his feet at night.   This pain cleared up this past Saturday- about 3 days ago. It went away as quickly as it came NKI to his foot.   His uric acid was borderline high in the past per his report  Wt Readings from Last 3 Encounters:  04/13/17 278 lb 9.6 oz (126.4 kg)  03/09/17 279 lb (126.6 kg)  01/27/17 279 lb (126.6 kg)   BP Readings from Last 3 Encounters:  04/13/17 134/88  03/09/17 132/84  12/26/16 130/80    Patient Active Problem List   Diagnosis Date Noted  . Controlled type 2 diabetes mellitus without complication, without long-term current use of insulin ([place]) 11/28/2016  . Obesity (BMI 30-39.9) 09/28/2015  . Vertigo 07/06/2015  . OSA (obstructive sleep apnea) 04/10/2015  . Excessive daytime sleepiness  04/10/2015  . Permanent atrial fibrillation ([place]) 10/25/2014  . Anticoagulation adequate with anticoagulant therapy 07/20/2013  . Umbilical hernia 35/36/1443  . SYNCOPE 05/01/2010  . Mixed hyperlipidemia 04/30/2010  . ESSENTIAL HYPERTENSION, BENIGN 04/30/2010  . Coronary artery disease involving native coronary artery of native heart without angina pectoris 04/30/2010  . ATRIAL FLUTTER 04/30/2010    Past Medical History:  Diagnosis Date  . Atrial flutter ([place])   . Cancer ([place])    skin  . Coronary artery disease involving native coronary artery of native heart without angina pectoris 04/30/2010   LHC 9/09:  S/p BMS to pLAD // Myoview 4/18: No ischemia, low risk, not gated  . Hyperlipidemia   . Hypertension   . Obesity (BMI 30-39.9) 09/28/2015  . Overweight(278.02)   . Permanent atrial fibrillation ([place]) 10/25/2014   Echo 10/14: EF 55-65%, mild MR, mod LAE, PASP 31 mmHg // Holter 9/16:  AFib, avg HR 70, rare HR up to 150, rare brady with 35 bpm and 3 sec pause during HS // Echo 4/18: EF 55-60, no RWMA, mild LAE, moderate RAE, trivial TR, PASP 28  . Syncope and collapse 03/29/2010    Past Surgical History:  Procedure Laterality Date  . CARDIAC CATHETERIZATION  9/09   PCI  LAD (BMS)  . nasal septal surgery    . TONSILLECTOMY    . WISDOM TOOTH EXTRACTION      Social History  Substance Use Topics  . Smoking status: Former Smoker    Packs/day: 1.00    Years: 30.00    Types: Cigarettes    Quit date: 08/26/1996  . Smokeless tobacco: Never Used  . Alcohol use Yes     Comment: previously heavy, now trying to cut back    Family History  Problem Relation Age of Onset  . Diabetes Father   . Heart disease Father   . Heart attack Maternal Uncle        early 78's  . Cancer Maternal Grandmother        unknown ?leukemia    Allergies  Allergen Reactions  . Penicillins Other (See Comments)    Childhood - Rash    Medication list has been reviewed and updated.  Current  Outpatient Prescriptions on File Prior to Visit  Medication Sig Dispense Refill  . cholecalciferol (VITAMIN D) 1000 UNITS tablet Take 2,000 Units by mouth daily.    Marland Kitchen lisinopril (PRINIVIL,ZESTRIL) 40 MG tablet Take 1 tablet (40 mg total) by mouth every morning. 90 tablet 3  . metFORMIN (GLUCOPHAGE) 500 MG tablet Take 1 tablet (500 mg total) by mouth daily. 90 tablet 3  . metoprolol succinate (TOPROL-XL) 100 MG 24 hr tablet Take 1 tablet (100 mg total) by mouth 2 (two) times daily. 180 tablet 3  . predniSONE (DELTASONE) 10 MG tablet Take 4 pills once daily x 3 days, Take 3 pills once daily x 3 days, then 2 pills daily x 3 days, then 1 pill daily x 3 days.    . VENTOLIN HFA 108 (90 Base) MCG/ACT inhaler INHALE 2 PUFFS INTO LUNG EVERY 4 HOURS AS NEEDED FOR WHEEZING/SHORTNESS OF BREATH, CHEST CONGESTION  1  . XARELTO 20 MG TABS tablet TAKE 1 TABLET BY MOUTH DAILY WITH SUPPER DISCONTINUE PRADAXA 30 tablet 5  . atorvastatin (LIPITOR) 80 MG tablet Take 1 tablet (80 mg total) by mouth daily. 90 tablet 3   No current facility-administered medications on file prior to visit.     Review of Systems:  As per HPI- otherwise negative. Marland Kitchen  Physical Examination: Vitals:   04/13/17 1031  BP: 134/88  Pulse: 89  Temp: 98.2 F (36.8 C)   Vitals:   04/13/17 1031  Weight: 278 lb 9.6 oz (126.4 kg)  Height: 6\' 2"  (1.88 m)   Body mass index is 35.77 kg/m. Ideal Body Weight: Weight in (lb) to have BMI = 25: 194.3  GEN: WDWN, NAD, Non-toxic, A & O x 3, obese, looks well HEENT: Atraumatic, Normocephalic. Neck supple. No masses, No LAD.   Bilateral TM wnl, oropharynx normal.  PEERL,EOMI.   Ears and Nose: No external deformity. CV: RRR, No M/G/R. No JVD. No thrill. No extra heart sounds. PULM: CTA B, no wheezes, crackles, rhonchi. No retractions. No resp. distress. No accessory muscle use. EXTR: No c/c/e NEURO Normal gait.  PSYCH: Normally interactive. Conversant. Not depressed or anxious appearing.  Calm  demeanor.  Foot exam:  Normal pulses.  Minimal edema of both feet.  Both feet are warm and well perfused.  At this time no tenderness to suggest gout.  He has some trouble perceiving monofilament at great toe bilaterally   Assessment and Plan: Great toe pain, left - Plan: Colchicine 0.6 MG CAPS  Permanent atrial fibrillation (HCC)  Controlled type 2 diabetes mellitus without  complication, without long-term current use of insulin ([place])  Doing well with metfomrin 500 per day.  Too soon to repeat A1c today Possible episodic gout. 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

## 2017-04-13 ENCOUNTER — Ambulatory Visit (INDEPENDENT_AMBULATORY_CARE_PROVIDER_SITE_OTHER): Payer: Medicare Other | Admitting: Family Medicine

## 2017-04-13 VITALS — BP 134/88 | HR 89 | Temp 98.2°F | Ht 74.0 in | Wt 278.6 lb

## 2017-04-13 DIAGNOSIS — I2510 Atherosclerotic heart disease of native coronary artery without angina pectoris: Secondary | ICD-10-CM

## 2017-04-13 DIAGNOSIS — E119 Type 2 diabetes mellitus without complications: Secondary | ICD-10-CM | POA: Diagnosis not present

## 2017-04-13 DIAGNOSIS — I482 Chronic atrial fibrillation: Secondary | ICD-10-CM | POA: Diagnosis not present

## 2017-04-13 DIAGNOSIS — I4821 Permanent atrial fibrillation: Secondary | ICD-10-CM

## 2017-04-13 DIAGNOSIS — M79675 Pain in left toe(s): Secondary | ICD-10-CM | POA: Diagnosis not present

## 2017-04-13 MED ORDER — COLCHICINE 0.6 MG PO CAPS: ORAL_CAPSULE | ORAL | 0 refills | Status: DC

## 2017-04-13 NOTE — Patient Instructions (Signed)
Your recent foot symptoms do sound suspicious for gout.   The next time this happens, try using colchicine.  Take 2 pills, then take 1 an hour later (and that is all for that gout attack)!  If you use the colchicine, do not take your cholesterol med for 48 hours after to reduce risk of interaction  Continue to use your metformin as you are now  Let me know if your feet are a persistent issue.  You likely do have mild nerve irritation (neuropathy) due to diabetes, but for the time being we will continue to keep control of your blood sugars in order to minimae this  Do get your eyes checked fairly soon  Please see me in 3-4 months to check on your A1c

## 2017-04-15 ENCOUNTER — Other Ambulatory Visit: Payer: Self-pay | Admitting: Emergency Medicine

## 2017-04-15 MED ORDER — COLCHICINE 0.6 MG PO TABS: 0.6000 mg | ORAL_TABLET | Freq: Every day | ORAL | 0 refills | Status: DC

## 2017-04-15 NOTE — Telephone Encounter (Signed)
Received fax from [facility] stating that Pt's insurance prefers Colchicine tablets instead of capsules. Changed from Capsules to Tablets as required by insurance.

## 2017-05-26 DIAGNOSIS — R310 Gross hematuria: Secondary | ICD-10-CM | POA: Diagnosis not present

## 2017-05-26 DIAGNOSIS — K429 Umbilical hernia without obstruction or gangrene: Secondary | ICD-10-CM | POA: Diagnosis not present

## 2017-05-29 DIAGNOSIS — R310 Gross hematuria: Secondary | ICD-10-CM | POA: Diagnosis not present

## 2017-05-29 DIAGNOSIS — K760 Fatty (change of) liver, not elsewhere classified: Secondary | ICD-10-CM | POA: Diagnosis not present

## 2017-05-30 ENCOUNTER — Other Ambulatory Visit: Payer: Self-pay | Admitting: Family Medicine

## 2017-06-21 ENCOUNTER — Other Ambulatory Visit: Payer: Self-pay | Admitting: Interventional Cardiology

## 2017-06-25 DIAGNOSIS — N281 Cyst of kidney, acquired: Secondary | ICD-10-CM | POA: Diagnosis not present

## 2017-06-25 DIAGNOSIS — R310 Gross hematuria: Secondary | ICD-10-CM | POA: Diagnosis not present

## 2017-07-27 ENCOUNTER — Ambulatory Visit: Payer: Medicare Other | Admitting: Family Medicine

## 2017-07-30 ENCOUNTER — Encounter: Payer: Self-pay | Admitting: Family Medicine

## 2017-07-30 ENCOUNTER — Ambulatory Visit (INDEPENDENT_AMBULATORY_CARE_PROVIDER_SITE_OTHER): Payer: Medicare Other | Admitting: Family Medicine

## 2017-07-30 ENCOUNTER — Ambulatory Visit: Payer: Medicare Other | Admitting: Family Medicine

## 2017-07-30 VITALS — BP 128/90 | HR 80 | Temp 97.8°F | Ht 74.0 in | Wt 279.0 lb

## 2017-07-30 DIAGNOSIS — Z23 Encounter for immunization: Secondary | ICD-10-CM

## 2017-07-30 DIAGNOSIS — M1A0710 Idiopathic chronic gout, right ankle and foot, without tophus (tophi): Secondary | ICD-10-CM | POA: Diagnosis not present

## 2017-07-30 DIAGNOSIS — I2510 Atherosclerotic heart disease of native coronary artery without angina pectoris: Secondary | ICD-10-CM | POA: Diagnosis not present

## 2017-07-30 DIAGNOSIS — E119 Type 2 diabetes mellitus without complications: Secondary | ICD-10-CM

## 2017-07-30 DIAGNOSIS — I482 Chronic atrial fibrillation: Secondary | ICD-10-CM

## 2017-07-30 DIAGNOSIS — E782 Mixed hyperlipidemia: Secondary | ICD-10-CM | POA: Diagnosis not present

## 2017-07-30 DIAGNOSIS — Z130 Encounter for screening for diseases of the blood and blood-forming organs and certain disorders involving the immune mechanism: Secondary | ICD-10-CM | POA: Diagnosis not present

## 2017-07-30 DIAGNOSIS — I4821 Permanent atrial fibrillation: Secondary | ICD-10-CM

## 2017-07-30 LAB — BASIC METABOLIC PANEL
BUN: 16 mg/dL (ref 6–23)
CO2: 28 mEq/L (ref 19–32)
Calcium: 9.1 mg/dL (ref 8.4–10.5)
Chloride: 106 mEq/L (ref 96–112)
Creatinine, Ser: 1.09 mg/dL (ref 0.40–1.50)
GFR: 71.94 mL/min (ref >60.00)
Glucose, Bld: 123 mg/dL — ABNORMAL HIGH (ref 70–99)
Potassium: 4.9 mEq/L (ref 3.5–5.1)
Sodium: 140 mEq/L (ref 135–145)

## 2017-07-30 LAB — CBC
HCT: 44.6 % (ref 39.0–52.0)
Hemoglobin: 15 g/dL (ref 13.0–17.0)
MCHC: 33.5 g/dL (ref 30.0–36.0)
MCV: 91.8 fl (ref 78.0–100.0)
Platelets: 230 10*3/uL (ref 150.0–400.0)
RBC: 4.86 Mil/uL (ref 4.22–5.81)
RDW: 14.3 % (ref 11.5–15.5)
WBC: 6.4 10*3/uL (ref 4.0–10.5)

## 2017-07-30 LAB — HEMOGLOBIN A1C: Hgb A1c MFr Bld: 6.3 % (ref 4.6–6.5)

## 2017-07-30 LAB — URIC ACID: Uric Acid, Serum: 9.1 mg/dL — ABNORMAL HIGH (ref 4.0–7.8)

## 2017-07-30 MED ORDER — ALLOPURINOL 100 MG PO TABS: 100.0000 mg | ORAL_TABLET | Freq: Every day | ORAL | 3 refills | Status: DC

## 2017-07-30 NOTE — Progress Notes (Addendum)
[place] at [facility] [address] [place], [place] [address] [phone]  Date:  07/30/2017   Name:  Arthur Rich   DOB:  04-08-1951   MRN:  [id number]  PCP:  [name], MD    Chief Complaint: Gout (Pt states he woke up to a gout flare up on his right great toe on Tuesday morning (3 day ago)) and Hypertension   History of Present Illness:  Arthur Rich is a 66 y.o. very pleasant male patient who presents with the following:  Last seen by myself in June of this year.  Recheck visit today  Noted a pain in his left foot about 10 days ago.  He wondered if he might have gout Dx with Diabetes last month due to 2 A1c of higher than 6.5% Started on metformin 500 mg once a day- he is tolerating this fine  He has persistent A fib on rate control, CAD with remote stent to LAD in 2009, obesity, OSA  Lab Results  Component Value Date   HGBA1C 6.6 (H) 03/09/2017   Flu shot:  Do today Foot exam: do today Eye exam:  About a year ago, he plans to do this soon  His right great toe gout is bothering him a bit again over the last few days He took some colchicine earlier this week.  This now seems to be getting better now He has not needed any prednisone  He would be interested in going back on allopurinol which he did take in the past and tolerated well  His Afib is stable- no change here.  No CP  He is on metoprolol, xarelto  He had a lexiscan in [place]-   There was no ST segment deviation noted during stress.  No T wave inversion was noted during stress.  The study is normal.  This is a low risk study.  No evidence of ischemia.  This study was not gated due to atrial fibrillation.   He did see urology and had a negative work- up for hematuria  He is doing fine on his xarelto   He is fasting today for labs  Patient Active Problem List   Diagnosis Date Noted  . Controlled type 2 diabetes mellitus  without complication, without long-term current use of insulin ([place]) 11/28/2016  . Obesity (BMI 30-39.9) 09/28/2015  . Vertigo 07/06/2015  . OSA (obstructive sleep apnea) 04/10/2015  . Excessive daytime sleepiness 04/10/2015  . Permanent atrial fibrillation ([place]) 10/25/2014  . Anticoagulation adequate with anticoagulant therapy 07/20/2013  . Umbilical hernia 34/19/3790  . SYNCOPE 05/01/2010  . Mixed hyperlipidemia 04/30/2010  . ESSENTIAL HYPERTENSION, BENIGN 04/30/2010  . Coronary artery disease involving native coronary artery of native heart without angina pectoris 04/30/2010  . ATRIAL FLUTTER 04/30/2010    Past Medical History:  Diagnosis Date  . Atrial flutter ([place])   . Cancer ([place])    skin  . Coronary artery disease involving native coronary artery of native heart without angina pectoris 04/30/2010   LHC 9/09:  S/p BMS to pLAD // Myoview 4/18: No ischemia, low risk, not gated  . Hyperlipidemia   . Hypertension   . Obesity (BMI 30-39.9) 09/28/2015  . Overweight(278.02)   . Permanent atrial fibrillation ([place]) 10/25/2014   Echo 10/14: EF 55-65%, mild MR, mod LAE, PASP 31 mmHg // Holter 9/16:  AFib, avg HR 70, rare HR up to 150, rare brady with 35 bpm and 3  sec pause during HS // Echo 4/18: EF 55-60, no RWMA, mild LAE, moderate RAE, trivial TR, PASP 28  . Syncope and collapse 03/29/2010    Past Surgical History:  Procedure Laterality Date  . CARDIAC CATHETERIZATION  9/09   PCI LAD (BMS)  . nasal septal surgery    . TONSILLECTOMY    . WISDOM TOOTH EXTRACTION      Social History  Substance Use Topics  . Smoking status: Former Smoker    Packs/day: 1.00    Years: 30.00    Types: Cigarettes    Quit date: 08/26/1996  . Smokeless tobacco: Never Used  . Alcohol use Yes     Comment: previously heavy, now trying to cut back    Family History  Problem Relation Age of Onset  . Diabetes Father   . Heart disease Father   . Heart attack Maternal Uncle        early 51's  .  Cancer Maternal Grandmother        unknown ?leukemia    Allergies  Allergen Reactions  . Penicillins Other (See Comments)    Childhood - Rash    Medication list has been reviewed and updated.  Current Outpatient Prescriptions on File Prior to Visit  Medication Sig Dispense Refill  . atorvastatin (LIPITOR) 80 MG tablet Take 1 tablet (80 mg total) by mouth daily. 90 tablet 3  . cholecalciferol (VITAMIN D) 1000 UNITS tablet Take 2,000 Units by mouth daily.    . colchicine (COLCRYS) 0.6 MG tablet Take 1 tablet (0.6 mg total) by mouth daily. 30 tablet 3  . lisinopril (PRINIVIL,ZESTRIL) 40 MG tablet Take 1 tablet (40 mg total) by mouth every morning. 90 tablet 3  . metFORMIN (GLUCOPHAGE) 500 MG tablet Take 1 tablet (500 mg total) by mouth daily. 90 tablet 3  . metoprolol succinate (TOPROL-XL) 100 MG 24 hr tablet Take 1 tablet (100 mg total) by mouth 2 (two) times daily. 180 tablet 3  . predniSONE (DELTASONE) 10 MG tablet Take 4 pills once daily x 3 days, Take 3 pills once daily x 3 days, then 2 pills daily x 3 days, then 1 pill daily x 3 days.    . rivaroxaban (XARELTO) 20 MG TABS tablet Take 1 tablet (20 mg total) by mouth daily with supper. 30 tablet 8  . VENTOLIN HFA 108 (90 Base) MCG/ACT inhaler INHALE 2 PUFFS INTO LUNG EVERY 4 HOURS AS NEEDED FOR WHEEZING/SHORTNESS OF BREATH, CHEST CONGESTION  1   No current facility-administered medications on file prior to visit.     Review of Systems:  As per HPI- otherwise negative. Notes that he has not done well on his diet- he will try harder  Wt Readings from Last 3 Encounters:  07/30/17 279 lb (126.6 kg)  04/13/17 278 lb 9.6 oz (126.4 kg)  03/09/17 279 lb (126.6 kg)    Physical Examination: Vitals:   07/30/17 0958  BP: 128/90  Pulse: 80  Temp: 97.8 F (36.6 C)  SpO2: 99%   Vitals:   07/30/17 0958  Weight: 279 lb (126.6 kg)  Height: 6\' 2"  (1.88 m)   Body mass index is 35.82 kg/m. Ideal Body Weight: Weight in (lb) to have  BMI = 25: 194.3  GEN: WDWN, NAD, Non-toxic, A & O x 3, looks well, obese  HEENT: Atraumatic, Normocephalic. Neck supple. No masses, No LAD. Ears and Nose: No external deformity. CV: atrial fib, No M/G/R. No JVD. No thrill. No extra heart sounds. PULM: CTA B,  no wheezes, crackles, rhonchi. No retractions. No resp. distress. No accessory muscle use. ABD: S, NT, ND EXTR: No c/c/e NEURO Normal gait.  PSYCH: Normally interactive. Conversant. Not depressed or anxious appearing.  Calm demeanor.    Assessment and Plan: Controlled type 2 diabetes mellitus without complication, without long-term current use of insulin ([place]) - Plan: Hemoglobin H0T, Basic metabolic panel  Permanent atrial fibrillation (HCC)  Mixed hyperlipidemia  Chronic idiopathic gout involving toe of right foot without tophus - Plan: allopurinol (ZYLOPRIM) 100 MG tablet, Uric acid  Screening for deficiency anemia - Plan: CBC  Need for immunization against influenza - Plan: Flu vaccine HIGH DOSE PF (Fluzone High dose)  Here today for a follow-up visit Recheck his DM labs A fib- rate controlled Start back on allopurinol 100 mg once a day- will check his uric acid today Flu shot today Will plan further follow- up pending labs.   Signed Lamar Blinks, MD  Received his labs 10/11 Results for orders placed or performed in visit on 07/30/17  Uric acid  Result Value Ref Range   Uric Acid, Serum 9.1 (H) 4.0 - 7.8 mg/dL  Hemoglobin A1c  Result Value Ref Range   Hgb A1c MFr Bld 6.3 4.6 - 6.5 %  Basic metabolic panel  Result Value Ref Range   Sodium 140 135 - 145 mEq/L   Potassium 4.9 3.5 - 5.1 mEq/L   Chloride 106 96 - 112 mEq/L   CO2 28 19 - 32 mEq/L   Glucose, Bld 123 (H) 70 - 99 mg/dL   BUN 16 6 - 23 mg/dL   Creatinine, Ser 1.09 0.40 - 1.50 mg/dL   Calcium 9.1 8.4 - 10.5 mg/dL   GFR 71.94 >60.00 mL/min  CBC  Result Value Ref Range   WBC 6.4 4.0 - 10.5 K/uL   RBC 4.86 4.22 - 5.81 Mil/uL   Platelets 230.0  150.0 - 400.0 K/uL   Hemoglobin 15.0 13.0 - 17.0 g/dL   HCT 44.6 39.0 - 52.0 %   MCV 91.8 78.0 - 100.0 fl   MCHC 33.5 30.0 - 36.0 g/dL   RDW 14.3 11.5 - 15.5 %   Message to p0t

## 2017-07-30 NOTE — Addendum Note (Signed)
Addended by: Lamar Blinks C on: 07/30/2017 08:01 PM   Modules accepted: Orders

## 2017-07-30 NOTE — Patient Instructions (Addendum)
It was nice to see you again today!  I will be in touch with your labs Please start on the allopurinol 100 mg once a day for now- we can increase if needed,  We will also check your uric acid level to get a baseline   Please do see your eye doctor soon

## 2017-12-02 ENCOUNTER — Other Ambulatory Visit: Payer: Self-pay | Admitting: Family Medicine

## 2017-12-02 DIAGNOSIS — I10 Essential (primary) hypertension: Secondary | ICD-10-CM

## 2017-12-04 NOTE — Telephone Encounter (Signed)
Received refill request for lisinopril (PRINIVIL,ZESTRIL) 40 MG tablet. Last office visit 07/30/17 and last refill 11/27/2016. Refill sent to pharmacy.

## 2017-12-17 ENCOUNTER — Other Ambulatory Visit: Payer: Self-pay | Admitting: Physician Assistant

## 2017-12-30 ENCOUNTER — Other Ambulatory Visit: Payer: Self-pay | Admitting: Physician Assistant

## 2018-01-18 ENCOUNTER — Other Ambulatory Visit: Payer: Self-pay | Admitting: Physician Assistant

## 2018-01-26 ENCOUNTER — Other Ambulatory Visit: Payer: Self-pay | Admitting: Physician Assistant

## 2018-02-12 DIAGNOSIS — L814 Other melanin hyperpigmentation: Secondary | ICD-10-CM | POA: Diagnosis not present

## 2018-02-12 DIAGNOSIS — L821 Other seborrheic keratosis: Secondary | ICD-10-CM | POA: Diagnosis not present

## 2018-02-12 DIAGNOSIS — D225 Melanocytic nevi of trunk: Secondary | ICD-10-CM | POA: Diagnosis not present

## 2018-02-12 DIAGNOSIS — D1801 Hemangioma of skin and subcutaneous tissue: Secondary | ICD-10-CM | POA: Diagnosis not present

## 2018-02-12 DIAGNOSIS — Z85828 Personal history of other malignant neoplasm of skin: Secondary | ICD-10-CM | POA: Diagnosis not present

## 2018-02-23 ENCOUNTER — Other Ambulatory Visit: Payer: Self-pay | Admitting: Interventional Cardiology

## 2018-02-23 MED ORDER — METOPROLOL SUCCINATE ER 100 MG PO TB24: 100.0000 mg | ORAL_TABLET | Freq: Two times a day (BID) | ORAL | 2 refills | Status: DC

## 2018-02-23 NOTE — Telephone Encounter (Signed)
Pt seeing Mickel Baas in July.  Ok to fill til then as pt hasnt been seen in over a year.

## 2018-03-10 ENCOUNTER — Other Ambulatory Visit: Payer: Self-pay | Admitting: Interventional Cardiology

## 2018-03-10 NOTE — Telephone Encounter (Signed)
Xarelto 20mg  refill request received; pt is 67yrs old, wt-126.6kg, Crea-1.09 on 07/30/17, CrCl-101.38ml/min, last seen by Richardson Dopp on 12/26/16 and has an appt with [name] on 05/07/18.

## 2018-03-18 ENCOUNTER — Other Ambulatory Visit: Payer: Self-pay | Admitting: Interventional Cardiology

## 2018-03-20 ENCOUNTER — Other Ambulatory Visit: Payer: Self-pay | Admitting: Family Medicine

## 2018-04-05 ENCOUNTER — Other Ambulatory Visit: Payer: Self-pay | Admitting: Interventional Cardiology

## 2018-04-05 MED ORDER — METOPROLOL SUCCINATE ER 100 MG PO TB24: 100.0000 mg | ORAL_TABLET | Freq: Two times a day (BID) | ORAL | 0 refills | Status: DC

## 2018-04-09 ENCOUNTER — Other Ambulatory Visit: Payer: Self-pay

## 2018-04-09 ENCOUNTER — Emergency Department (HOSPITAL_COMMUNITY): Payer: Medicare Other

## 2018-04-09 ENCOUNTER — Emergency Department (HOSPITAL_COMMUNITY)
Admission: EM | Admit: 2018-04-09 | Discharge: 2018-04-09 | Disposition: A | Discharge status: Home / Self Care | Payer: Medicare Other | Attending: Emergency Medicine | Admitting: Emergency Medicine

## 2018-04-09 ENCOUNTER — Encounter (HOSPITAL_COMMUNITY): Payer: Self-pay | Admitting: Emergency Medicine

## 2018-04-09 DIAGNOSIS — R042 Hemoptysis: Secondary | ICD-10-CM | POA: Insufficient documentation

## 2018-04-09 DIAGNOSIS — R0602 Shortness of breath: Secondary | ICD-10-CM | POA: Diagnosis not present

## 2018-04-09 DIAGNOSIS — Z87891 Personal history of nicotine dependence: Secondary | ICD-10-CM | POA: Insufficient documentation

## 2018-04-09 DIAGNOSIS — R05 Cough: Secondary | ICD-10-CM | POA: Diagnosis not present

## 2018-04-09 DIAGNOSIS — Z7984 Long term (current) use of oral hypoglycemic drugs: Secondary | ICD-10-CM | POA: Diagnosis not present

## 2018-04-09 DIAGNOSIS — I2510 Atherosclerotic heart disease of native coronary artery without angina pectoris: Secondary | ICD-10-CM | POA: Diagnosis not present

## 2018-04-09 DIAGNOSIS — Z85828 Personal history of other malignant neoplasm of skin: Secondary | ICD-10-CM | POA: Diagnosis not present

## 2018-04-09 DIAGNOSIS — Z7901 Long term (current) use of anticoagulants: Secondary | ICD-10-CM | POA: Diagnosis not present

## 2018-04-09 DIAGNOSIS — Z79899 Other long term (current) drug therapy: Secondary | ICD-10-CM | POA: Insufficient documentation

## 2018-04-09 DIAGNOSIS — E119 Type 2 diabetes mellitus without complications: Secondary | ICD-10-CM | POA: Diagnosis not present

## 2018-04-09 DIAGNOSIS — I10 Essential (primary) hypertension: Secondary | ICD-10-CM | POA: Insufficient documentation

## 2018-04-09 LAB — CBC
HCT: 44.7 % (ref 39.0–52.0)
Hemoglobin: 14.7 g/dL (ref 13.0–17.0)
MCH: 30.2 pg (ref 26.0–34.0)
MCHC: 32.9 g/dL (ref 30.0–36.0)
MCV: 91.8 fL (ref 78.0–100.0)
Platelets: 239 10*3/uL (ref 150–400)
RBC: 4.87 MIL/uL (ref 4.22–5.81)
RDW: 13.2 % (ref 11.5–15.5)
WBC: 9.8 10*3/uL (ref 4.0–10.5)

## 2018-04-09 LAB — BASIC METABOLIC PANEL
Anion gap: 9 (ref 5–15)
BUN: 12 mg/dL (ref 6–20)
CO2: 23 mmol/L (ref 22–32)
Calcium: 9.3 mg/dL (ref 8.9–10.3)
Chloride: 107 mmol/L (ref 101–111)
Creatinine, Ser: 1.14 mg/dL (ref 0.61–1.24)
GFR calc Af Amer: >60 mL/min (ref >60)
GFR calc non Af Amer: >60 mL/min (ref >60)
Glucose, Bld: 101 mg/dL — ABNORMAL HIGH (ref 65–99)
Potassium: 4.1 mmol/L (ref 3.5–5.1)
Sodium: 139 mmol/L (ref 135–145)

## 2018-04-09 NOTE — ED Triage Notes (Signed)
C/o coughing up small amount of "cherry red" and clear colored phlegm today.  History of afib.  Also reports intermittent mild sob today.

## 2018-04-09 NOTE — ED Provider Notes (Signed)
[facility] EMERGENCY DEPARTMENT Provider Note   CSN: [id number] Arrival date & time: 04/09/18  1925     History   Chief Complaint Chief Complaint  Patient presents with  . Hemoptysis  . Shortness of Breath    HPI Arthur Rich is a 67 y.o. male.  67 year old male with prior history significant for atrial fibrillation who is on Xarelto presents with complaint of bloody sputum production.  Patient reports that earlier today he coughed with vigor and produced a small amount of phlegm and had a small amount of bright red blood in it.  He reports probably less than a teaspoon of blood.  Is having one more time over the course of today.  Patient decided to come to the ED for evaluation.  He was concerned that he either had a PE or tuberculosis.  Denies any recent exposure to people with tuberculosis or else travel to places where endemic TB is present.  He is compliant with his Xarelto. He does not have a prior history of PE.  The history is provided by the patient.  Shortness of Breath  This is a new problem. The problem occurs rarely.The current episode started 3 to 5 hours ago. The problem has not changed since onset.Associated symptoms include cough. Pertinent negatives include no fever. He has tried nothing for the symptoms.    Past Medical History:  Diagnosis Date  . Atrial flutter ([place])   . Cancer ([place])    skin  . Coronary artery disease involving native coronary artery of native heart without angina pectoris 04/30/2010   LHC 9/09:  S/p BMS to pLAD // Myoview 4/18: No ischemia, low risk, not gated  . Hyperlipidemia   . Hypertension   . Obesity (BMI 30-39.9) 09/28/2015  . Overweight(278.02)   . Permanent atrial fibrillation ([place]) 10/25/2014   Echo 10/14: EF 55-65%, mild MR, mod LAE, PASP 31 mmHg // Holter 9/16:  AFib, avg HR 70, rare HR up to 150, rare brady with 35 bpm and 3 sec pause during HS // Echo 4/18: EF 55-60, no RWMA, mild LAE, moderate RAE,  trivial TR, PASP 28  . Syncope and collapse 03/29/2010    Patient Active Problem List   Diagnosis Date Noted  . Controlled type 2 diabetes mellitus without complication, without long-term current use of insulin ([place]) 11/28/2016  . Obesity (BMI 30-39.9) 09/28/2015  . Vertigo 07/06/2015  . OSA (obstructive sleep apnea) 04/10/2015  . Excessive daytime sleepiness 04/10/2015  . Permanent atrial fibrillation ([place]) 10/25/2014  . Anticoagulation adequate with anticoagulant therapy 07/20/2013  . Umbilical hernia 44/12/4740  . SYNCOPE 05/01/2010  . Mixed hyperlipidemia 04/30/2010  . ESSENTIAL HYPERTENSION, BENIGN 04/30/2010  . Coronary artery disease involving native coronary artery of native heart without angina pectoris 04/30/2010  . ATRIAL FLUTTER 04/30/2010    Past Surgical History:  Procedure Laterality Date  . CARDIAC CATHETERIZATION  9/09   PCI LAD (BMS)  . nasal septal surgery    . TONSILLECTOMY    . WISDOM TOOTH EXTRACTION          Home Medications    Prior to Admission medications   Medication Sig Start Date End Date Taking? Authorizing Provider  acetaminophen (TYLENOL) 500 MG tablet Take 500 mg by mouth every 6 (six) hours as needed for headache (pain).   Yes [provider]  allopurinol (ZYLOPRIM) 100 MG tablet Take 1 tablet (100 mg total) by mouth daily. 07/30/17  Yes Copland, Gay Filler, MD  atorvastatin (LIPITOR)  80 MG tablet Take 1 tablet (80 mg total) by mouth daily. Please make overdue yearly appt with Dr. [name] before anymore refills. 2nd attempt Patient taking differently: Take 80 mg by mouth every evening. Please make overdue yearly appt with Dr. [name] before anymore refills. 2nd attempt 01/19/18  Yes Weaver, Scott T, PA-C  cholecalciferol (VITAMIN D) 1000 UNITS tablet Take 2,000 Units by mouth daily. 07/01/13  Yes Allred, Jeneen Rinks, MD  lisinopril (PRINIVIL,ZESTRIL) 40 MG tablet TAKE 1 TABLET BY MOUTH EVERY MORNING 12/04/17  Yes Copland, Gay Filler, MD  metFORMIN  (GLUCOPHAGE) 500 MG tablet TAKE 1 TABLET BY MOUTH EVERY DAY Patient taking differently: TAKE 1 TABLET BY MOUTH EVERY DAY WITH SUPPER 03/23/18  Yes Copland, Gay Filler, MD  metoprolol succinate (TOPROL-XL) 100 MG 24 hr tablet Take 1 tablet (100 mg total) by mouth 2 (two) times daily. Please keep upcoming appt for further refills. 04/05/18  Yes Belva Crome, MD  Polyethyl Glycol-Propyl Glycol (SYSTANE OP) Place 1 drop into both eyes 2 (two) times daily as needed (dry eyes).   Yes [provider]  PRESCRIPTION MEDICATION Inhale into the lungs at bedtime.   Yes [provider]  XARELTO 20 MG TABS tablet TAKE 1 TABLET (20 MG TOTAL) BY MOUTH DAILY WITH SUPPER. 03/10/18  Yes Belva Crome, MD  colchicine (COLCRYS) 0.6 MG tablet Take 1 tablet (0.6 mg total) by mouth daily. Patient not taking: Reported on 04/09/2018 06/02/17   Copland, Gay Filler, MD    Family History Family History  Problem Relation Age of Onset  . Diabetes Father   . Heart disease Father   . Heart attack Maternal Uncle        early 65's  . Cancer Maternal Grandmother        unknown ?leukemia    Social History Social History   Tobacco Use  . Smoking status: Former Smoker    Packs/day: 1.00    Years: 30.00    Pack years: 30.00    Types: Cigarettes    Last attempt to quit: 08/26/1996    Years since quitting: 21.6  . Smokeless tobacco: Never Used  Substance Use Topics  . Alcohol use: Yes    Comment: previously heavy, now trying to cut back  . Drug use: No     Allergies   Penicillins   Review of Systems Review of Systems  Constitutional: Negative for fever.  Respiratory: Positive for cough. Negative for shortness of breath.   All other systems reviewed and are negative.    Physical Exam Updated Vital Signs BP (!) 120/99   Pulse 80   Temp 98.8 F (37.1 C)   Resp (!) 24   Ht 6\' 2"  (1.88 m)   Wt 120.2 kg (265 lb)   SpO2 96%   BMI 34.02 kg/m   Physical Exam  Constitutional: He is oriented  to person, place, and time. He appears well-developed and well-nourished. No distress.  HENT:  Head: Normocephalic and atraumatic.  Mouth/Throat: Oropharynx is clear and moist.  Eyes: Pupils are equal, round, and reactive to light. Conjunctivae and EOM are normal.  Neck: Normal range of motion. Neck supple.  Cardiovascular: Normal rate, regular rhythm and normal heart sounds.  Pulmonary/Chest: Effort normal and breath sounds normal. No respiratory distress.  Abdominal: Soft. He exhibits no distension. There is no tenderness.  Musculoskeletal: Normal range of motion. He exhibits no edema or deformity.  Neurological: He is alert and oriented to person, place, and time.  Skin: Skin  is warm and dry.  Psychiatric: He has a normal mood and affect.  Nursing note and vitals reviewed.    ED Treatments / Results  Labs (all labs ordered are listed, but only abnormal results are displayed) Labs Reviewed  BASIC METABOLIC PANEL - Abnormal; Notable for the following components:      Result Value   Glucose, Bld 101 (*)    All other components within normal limits  CBC    EKG EKG Interpretation  Date/Time:  Friday April 09 2018 19:35:25 EDT Ventricular Rate:  117 PR Interval:    QRS Duration: 82 QT Interval:  326 QTC Calculation: 170 R Axis:   4 Text Interpretation:  Atrial fibrillation with rapid ventricular response Nonspecific ST abnormality Abnormal ECG Confirmed by Dene Gentry [phone]) on 04/09/2018 9:16:42 PM   Radiology Dg Chest 2 View  Result Date: 04/09/2018 CLINICAL DATA:  Shortness of breath and coughing up blood EXAM: CHEST - 2 VIEW COMPARISON:  03/09/2017 FINDINGS: The heart size and mediastinal contours are within normal limits. Both lungs are clear. The visualized skeletal structures are unremarkable. IMPRESSION: No active cardiopulmonary disease. Electronically Signed   By: Donavan Foil M.D.   On: 04/09/2018 20:41    Procedures Procedures (including critical care  time)  Medications Ordered in ED Medications - No data to display   Initial Impression / Assessment and Plan / ED Course  I have reviewed the triage vital signs and the nursing notes.  Pertinent labs & imaging results that were available during my care of the patient were reviewed by me and considered in my medical decision making (see chart for details).     MDM  Screen Complete  Patient presents for evaluation of hemoptysis.  Reported volume of hemoptysis is very low.  Screening labs are without significant abnormality.  Chest x-ray is clear.  Patient without recurrence of hemoptysis while in the ED.  Patient does not have evidence of any other significant acute pathology.  He declines further work-up in the ED including CTA of the chest.  Desires discharge home.  Follow-up instructions given and understood.  Strict return precautions given and understood.  Final Clinical Impressions(s) / ED Diagnoses   Final diagnoses:  Cough with hemoptysis    ED Discharge Orders    None       Valarie Merino, MD 04/09/18 2215

## 2018-04-09 NOTE — Discharge Instructions (Signed)
Please return for any problem.  Follow-up with your regular doctor as instructed. °

## 2018-04-09 NOTE — ED Provider Notes (Signed)
Patient placed in Quick Look pathway, seen and evaluated   Chief Complaint: hemoptysis   HPI:   Pt reports that recently he has increased cleat sputum production. He noted small amount of blood in his sputum this morning. No associated chest pain, minor SOB although he often has sob secondary to A fib. No history of PE/DVT, no LEE. Currently taking xaraleto for AFIB. No dark stools, no bruising   ROS: hemoptysis  (one)  Physical Exam:   Gen: No distress  Neuro: Awake and Alert  Skin: Warm    Focused Exam: Lungs: clear, Cardiac: irregularly irregular rhythm    Initiation of care has begun. The patient has been counseled on the process, plan, and necessity for staying for the completion/evaluation, and the remainder of the medical screening examination    Arthur Rich, Arthur Rich 04/09/18 1952    Noemi Chapel, MD 04/11/18 2351

## 2018-04-09 NOTE — ED Notes (Signed)
ED Provider at bedside. 

## 2018-05-06 NOTE — Progress Notes (Signed)
Cardiology Office Note   Date:  05/07/2018   ID:  RC AMISON, DOB [birth year], MRN [id number]  PCP:  [name], MD  Cardiologist:  Dr. [name]    Chief Complaint  Patient presents with  . Atrial Flutter      History of Present Illness: Arthur Rich is a 67 y.o. male who presents for a fib and CAD.     Pt with a hx of persistent atrial fibrillation, CAD status post remote BMS to the LAD in 2009, OSA, HL, obesity, prior A flutter.   stress test 01/2017 normal, echo normal EF 55-60% Recent hemoptysis seen in ER.    His CXR was stable in ER, labs were good.  No further episodes of hemoptysis.  Pt continues with not feeling well at times and HR with that is 80-90.  He rests and it is some better.  The stress test and echo were done for these symptoms last year.  He has no chest pain.  He has had more of these episodes this year.  No hematuria or melena. Though hx of hematuria and was evaluated by urologist.     Past Medical History:  Diagnosis Date  . Atrial flutter ([place])   . Cancer ([place])    skin  . Coronary artery disease involving native coronary artery of native heart without angina pectoris 04/30/2010   LHC 9/09:  S/p BMS to pLAD // Myoview 4/18: No ischemia, low risk, not gated  . Hyperlipidemia   . Hypertension   . Obesity (BMI 30-39.9) 09/28/2015  . Overweight(278.02)   . Permanent atrial fibrillation ([place]) 10/25/2014   Echo 10/14: EF 55-65%, mild MR, mod LAE, PASP 31 mmHg // Holter 9/16:  AFib, avg HR 70, rare HR up to 150, rare brady with 35 bpm and 3 sec pause during HS // Echo 4/18: EF 55-60, no RWMA, mild LAE, moderate RAE, trivial TR, PASP 28  . Syncope and collapse 03/29/2010    Past Surgical History:  Procedure Laterality Date  . CARDIAC CATHETERIZATION  9/09   PCI LAD (BMS)  . nasal septal surgery    . TONSILLECTOMY    . WISDOM TOOTH EXTRACTION       Current Outpatient Medications  Medication Sig Dispense Refill  . acetaminophen  (TYLENOL) 500 MG tablet Take 500 mg by mouth every 6 (six) hours as needed for headache (pain).    Marland Kitchen allopurinol (ZYLOPRIM) 100 MG tablet Take 1 tablet (100 mg total) by mouth daily. 90 tablet 3  . atorvastatin (LIPITOR) 80 MG tablet Take 1 tablet (80 mg total) by mouth daily. Please make overdue yearly appt with Dr. [name] before anymore refills. 2nd attempt (Patient taking differently: Take 80 mg by mouth every evening. Please make overdue yearly appt with Dr. [name] before anymore refills. 2nd attempt) 15 tablet 0  . azithromycin (ZITHROMAX) 250 MG tablet     . cholecalciferol (VITAMIN D) 1000 UNITS tablet Take 2,000 Units by mouth daily.    Marland Kitchen lisinopril (PRINIVIL,ZESTRIL) 40 MG tablet TAKE 1 TABLET BY MOUTH EVERY MORNING 90 tablet 3  . metFORMIN (GLUCOPHAGE) 500 MG tablet TAKE 1 TABLET BY MOUTH EVERY DAY (Patient taking differently: TAKE 1 TABLET BY MOUTH EVERY DAY WITH SUPPER) 90 tablet 0  . metoprolol succinate (TOPROL-XL) 100 MG 24 hr tablet Take 1 tablet (100 mg total) by mouth 2 (two) times daily. Please keep upcoming appt for further refills. 60 tablet 0  . metroNIDAZOLE (FLAGYL) 500 MG tablet     .  Polyethyl Glycol-Propyl Glycol (SYSTANE OP) Place 1 drop into both eyes 2 (two) times daily as needed (dry eyes).    Marland Kitchen PRESCRIPTION MEDICATION Inhale into the lungs at bedtime.    [name] 20 MG TABS tablet TAKE 1 TABLET (20 MG TOTAL) BY MOUTH DAILY WITH SUPPER. 30 tablet 3  . colchicine (COLCRYS) 0.6 MG tablet Take 1 tablet (0.6 mg total) by mouth daily. (Patient not taking: Reported on 05/07/2018) 30 tablet 3   No current facility-administered medications for this visit.     Allergies:   Penicillins    Social History:  The patient  reports that he quit smoking about 21 years ago. His smoking use included cigarettes. He has a 30.00 pack-year smoking history. He has never used smokeless tobacco. He reports that he drinks alcohol. He reports that he does not use drugs.   Family History:  The  patient's family history includes Cancer in his maternal grandmother; Diabetes in his father; Heart attack in his maternal uncle; Heart disease in his father.    ROS:  General:no colds or fevers, no weight changes Skin:no rashes or ulcers HEENT:no blurred vision, no congestion CV:see HPI PUL:see HPI GI:no diarrhea constipation or melena, no indigestion GU:no hematuria, no dysuria MS:no joint pain, no claudication Neuro:no syncope, no lightheadedness Endo:+ diabetes, no thyroid disease  Wt Readings from Last 3 Encounters:  05/07/18 272 lb 6.4 oz (123.6 kg)  04/09/18 265 lb (120.2 kg)  07/30/17 279 lb (126.6 kg)     PHYSICAL EXAM: VS:  BP 138/80   Pulse 89   Ht 6\' 2"  (1.88 m)   Wt 272 lb 6.4 oz (123.6 kg)   SpO2 98%   BMI 34.97 kg/m  , BMI Body mass index is 34.97 kg/m. General:Pleasant affect, NAD Skin:Warm and dry, brisk capillary refill HEENT:normocephalic, sclera clear, mucus membranes moist Neck:supple, no JVD, no bruits  Heart:S1S2 RRR without murmur, gallup, rub or click Lungs:clear without rales, rhonchi, or wheezes XBW:IOMB, non tender, + BS, do not palpate liver spleen or masses Ext:no lower ext edema, 2+ pedal pulses, 2+ radial pulses Neuro:alert and oriented X 3, MAE, follows commands, + facial symmetry    EKG:  EKG is NOT ordered today. The ekg from ER 04/09/18 was evaluated for a fib.no acute changes.    Recent Labs: 04/09/2018: BUN 12; Creatinine, Ser 1.14; Hemoglobin 14.7; Platelets 239; Potassium 4.1; Sodium 139    Lipid Panel    Component Value Date/Time   CHOL 162 03/30/2017 0845   TRIG 261 (H) 03/30/2017 0845   HDL 38 (L) 03/30/2017 0845   CHOLHDL 4.3 03/30/2017 0845   CHOLHDL 5 11/27/2016 1733   VLDL 49.8 (H) 11/27/2016 1733   LDLCALC 72 03/30/2017 0845   LDLDIRECT 107.0 11/27/2016 1733       Other studies Reviewed: Additional studies/ records that were reviewed today include: .  Holter 9/16 AFib, avg HR 70, rare HR up to 150, rare  brady with 35 bpm and 3 sec pause during HS  Echo 10/14 EF 55-65%, mild MR, mod LAE, PASP 31 mmHg  LHC 9/09 BMS to pLAD  Nuc 2018 no ischemia  Echo 2018 stable with normal EF   ASSESSMENT AND PLAN:  1.  Hemoptysis on xarelto with hx of tobacco use in past.  Will do chest CT to eval for any issues since he is on xareto - which he will continue.  2.  CAD with hx of stent and episodes of just feeling bad.HR is usually 80=-90s  then.  Neg nuc and echo for same symptoms last year. He will call if they keep increasing.  He will follow up with Dr. [name] in 1 year otherwise.  He will continue to exercise.  3.  Permanent a fib on xarelto and rate controlled  4.  HLD followed by PCP   5.  HTN controlled  6.  OSA stable. Has CPAP   Current medicines are reviewed with the patient today.  The patient Has no concerns regarding medicines.  The following changes have been made:  See above Labs/ tests ordered today include:see above  Disposition:   FU:  see above  Signed, [name], NP  05/07/2018 10:50 AM    [facility] [place], [place] Phone: [phone]; Fax: [phone]

## 2018-05-07 ENCOUNTER — Ambulatory Visit (INDEPENDENT_AMBULATORY_CARE_PROVIDER_SITE_OTHER): Payer: Medicare Other | Admitting: Cardiology

## 2018-05-07 ENCOUNTER — Encounter: Payer: Self-pay | Admitting: Cardiology

## 2018-05-07 VITALS — BP 138/80 | HR 89 | Ht 74.0 in | Wt 272.4 lb

## 2018-05-07 DIAGNOSIS — R042 Hemoptysis: Secondary | ICD-10-CM

## 2018-05-07 DIAGNOSIS — G4733 Obstructive sleep apnea (adult) (pediatric): Secondary | ICD-10-CM

## 2018-05-07 DIAGNOSIS — E782 Mixed hyperlipidemia: Secondary | ICD-10-CM

## 2018-05-07 DIAGNOSIS — I482 Chronic atrial fibrillation: Secondary | ICD-10-CM | POA: Diagnosis not present

## 2018-05-07 DIAGNOSIS — I2510 Atherosclerotic heart disease of native coronary artery without angina pectoris: Secondary | ICD-10-CM | POA: Diagnosis not present

## 2018-05-07 DIAGNOSIS — I10 Essential (primary) hypertension: Secondary | ICD-10-CM

## 2018-05-07 DIAGNOSIS — I4821 Permanent atrial fibrillation: Secondary | ICD-10-CM

## 2018-05-07 MED ORDER — METOPROLOL SUCCINATE ER 100 MG PO TB24: 100.0000 mg | ORAL_TABLET | Freq: Two times a day (BID) | ORAL | 3 refills | Status: DC

## 2018-05-07 MED ORDER — ATORVASTATIN CALCIUM 80 MG PO TABS: 80.0000 mg | ORAL_TABLET | Freq: Every day | ORAL | 3 refills | Status: DC

## 2018-05-07 NOTE — Patient Instructions (Addendum)
Medication Instructions:  Your physician recommends that you continue on your current medications as directed. Please refer to the Current Medication list given to you today.  Patients Lipitor and metoprolol were sent today to patient's requested pharmacy. Labwork: -None  Testing/Procedures: Non-Cardiac CT scanning, (CAT scanning), is a noninvasive, special x-ray that produces cross-sectional images of the body using x-rays and a computer. CT scans help physicians diagnose and treat medical conditions. For some CT exams, a contrast material is used to enhance visibility in the area of the body being studied. CT scans provide greater clarity and reveal more details than regular x-ray exams.    Follow-Up: Your physician wants you to follow-up in: 1 year with Dr. [name].  You will receive a reminder letter in the mail two months in advance. If you don't receive a letter, please call our office to schedule the follow-up appointment.   Any Other Special Instructions Will Be Listed Below (If Applicable).     If you need a refill on your cardiac medications before your next appointment, please call your pharmacy.

## 2018-05-19 ENCOUNTER — Ambulatory Visit (INDEPENDENT_AMBULATORY_CARE_PROVIDER_SITE_OTHER)
Admission: RE | Admit: 2018-05-19 | Discharge: 2018-05-19 | Disposition: A | Discharge status: Home / Self Care | Payer: Medicare Other | Source: Ambulatory Visit | Attending: Cardiology | Admitting: Cardiology

## 2018-05-19 DIAGNOSIS — R042 Hemoptysis: Secondary | ICD-10-CM

## 2018-05-21 ENCOUNTER — Telehealth: Payer: Self-pay | Admitting: *Deleted

## 2018-05-21 DIAGNOSIS — R042 Hemoptysis: Secondary | ICD-10-CM

## 2018-05-21 NOTE — Telephone Encounter (Signed)
-----   Message from Isaiah Serge, NP sent at 05/20/2018  1:18 PM EDT ----- Please let pt know that Dr. [name] reviewed CT of chest and there is a nodule, but we are not sure if a nodule or lymph node but he would like pt to see pulmonary for evaluation.  Please arrange for pt to be seen in nodule clinic with Barbaraann Barthel, NP with pulmonary or Pulmonary MD.  He just doesn't want to miss any reason for the hemoptysis.

## 2018-06-18 ENCOUNTER — Other Ambulatory Visit: Payer: Self-pay | Admitting: Family Medicine

## 2018-06-21 ENCOUNTER — Other Ambulatory Visit: Payer: Self-pay | Admitting: Interventional Cardiology

## 2018-06-22 NOTE — Telephone Encounter (Signed)
Xarelto 20mg  refill request received; pt is 67 yrs old, wt-123.6kg, Crea-1.14 on 04/09/18, last seen by [name] in 05/07/18, CrCl-11.63ml/min; will send in refill to requested pharmacy.

## 2018-06-25 IMAGING — DX DG CHEST 2V
2 series · 2 of 2 positions shown · non-contrast
Comparison: A [DATE]

CLINICAL DATA: Cough, congestion, and subjective fever. Former
smoker.

EXAM:
CHEST  2 VIEW

[chest pa]
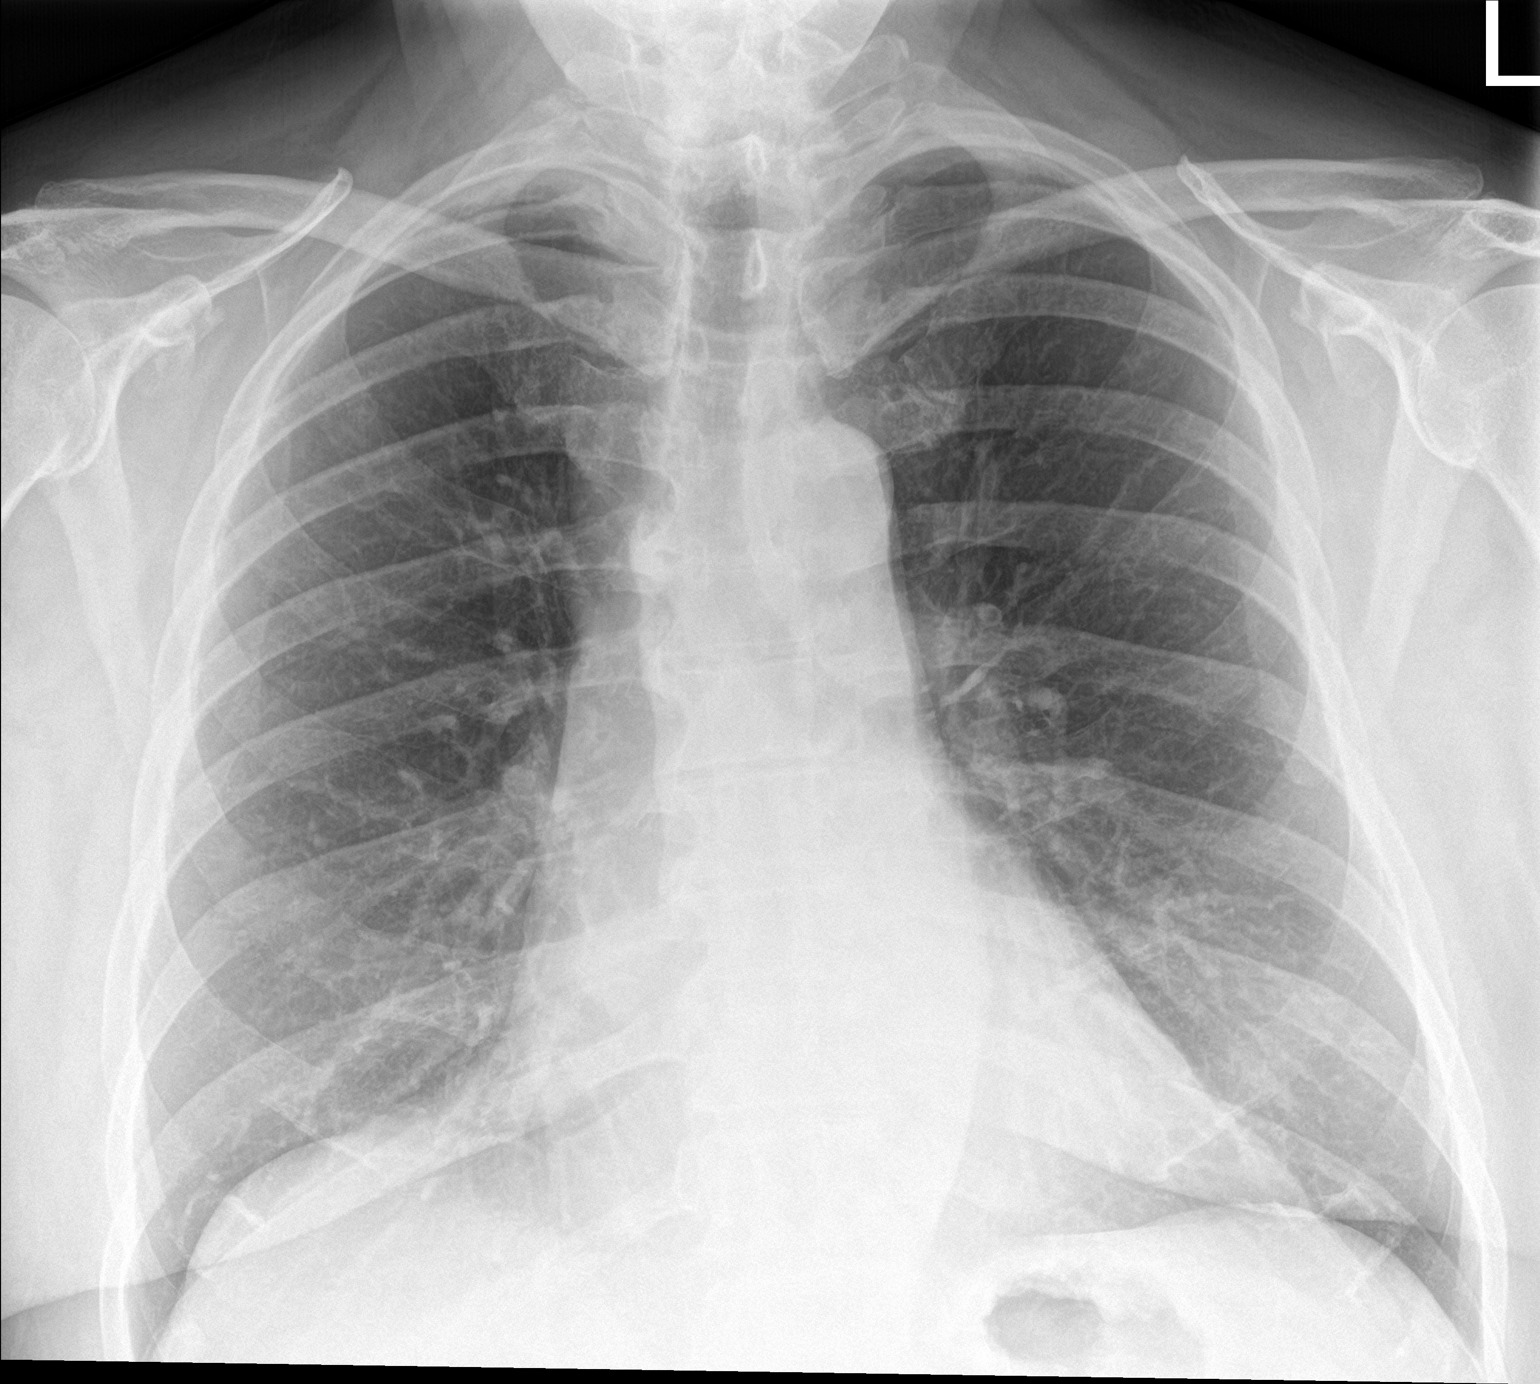

[chest lat]
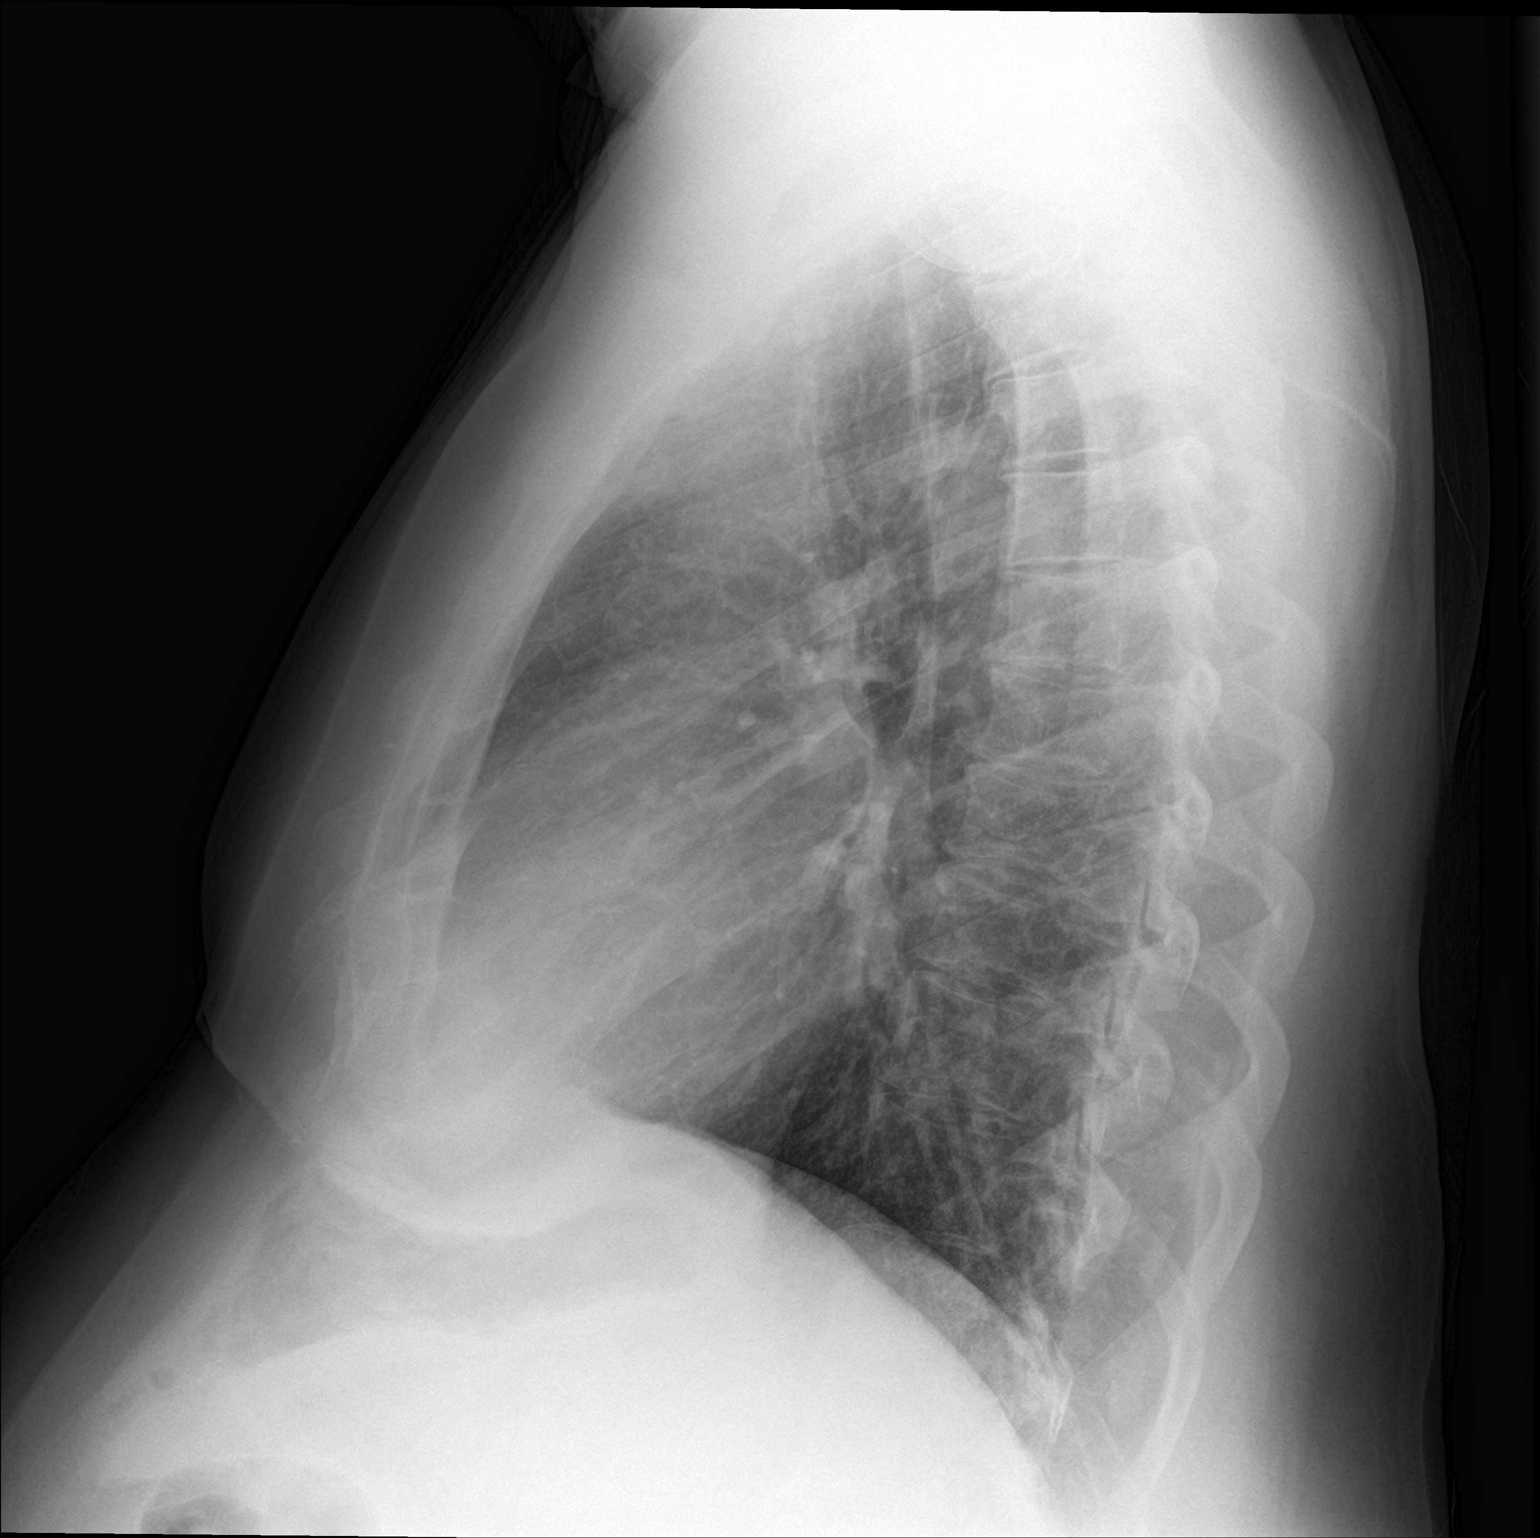

[2 of 2 positions shown; findings below may reference images not displayed]

FINDINGS: The cardiac silhouette is within normal limits in size. The thoracic
aorta is slightly tortuous. The lungs are better inflated than on
the prior study with minimal atelectasis or scarring in the lung
bases. No segmental airspace consolidation, edema, pleural effusion,
or pneumothorax is identified. Mild thoracic spondylosis is noted.
IMPRESSION: No active cardiopulmonary disease.

## 2018-07-12 ENCOUNTER — Encounter: Payer: Self-pay | Admitting: Emergency Medicine

## 2018-07-12 ENCOUNTER — Ambulatory Visit (INDEPENDENT_AMBULATORY_CARE_PROVIDER_SITE_OTHER): Payer: Medicare Other | Admitting: Emergency Medicine

## 2018-07-12 VITALS — BP 136/88 | HR 79 | Ht 73.75 in | Wt 273.0 lb

## 2018-07-12 DIAGNOSIS — Z87891 Personal history of nicotine dependence: Secondary | ICD-10-CM | POA: Diagnosis not present

## 2018-07-12 DIAGNOSIS — I2510 Atherosclerotic heart disease of native coronary artery without angina pectoris: Secondary | ICD-10-CM

## 2018-07-12 DIAGNOSIS — R042 Hemoptysis: Secondary | ICD-10-CM

## 2018-07-12 DIAGNOSIS — R9389 Abnormal findings on diagnostic imaging of other specified body structures: Secondary | ICD-10-CM | POA: Insufficient documentation

## 2018-07-12 DIAGNOSIS — Z720 Tobacco use: Secondary | ICD-10-CM

## 2018-07-12 DIAGNOSIS — Z23 Encounter for immunization: Secondary | ICD-10-CM

## 2018-07-12 NOTE — Progress Notes (Signed)
Subjective:    Patient ID: Arthur Rich, male    DOB: 1951/04/11, 67 y.o.   MRN: [id number]  HPI 67 year old obese former smoker (30 pack years) with a history of A. fib/flutter on anticoagulation, coronary disease, hypertension.  He is referred today for an episode of hemoptysis and to review his CT scan of the chest. He was well until he had an episode of heavy cough in mid July, saw some bright blood in the sputum - about a teaspoon mixed w mucous, happened twice.  Denies any epistaxis. He does not have a lot of cough, although he does sometimes need to clear mucous associated w meals.   CT scan of the chest 05/19/2018 reviewed by me shows some very mild prominence of some peripheral right lower lobe airways without definite bronchiectasis.  There was no mass or consolidation noted.  There is a 5 mm subpleural pulmonary nodule at the right minor fissure and some smaller ones along the right major fissure.  There is groundglass density in the medial right lower lobe that has been stable compared with an abdominal CT that was done in 2018.    Review of Systems  Constitutional: Negative for fever and unexpected weight change.  HENT: Negative for congestion, dental problem, ear pain, nosebleeds, postnasal drip, rhinorrhea, sinus pressure, sneezing, sore throat and trouble swallowing.   Eyes: Negative for redness and itching.  Respiratory: Positive for cough and shortness of breath. Negative for chest tightness and wheezing.   Cardiovascular: Negative for palpitations and leg swelling.  Gastrointestinal: Negative for nausea and vomiting.  Genitourinary: Negative for dysuria.  Musculoskeletal: Negative for joint swelling.  Skin: Negative for rash.  Neurological: Negative for headaches.  Hematological: Does not bruise/bleed easily.  Psychiatric/Behavioral: Negative for dysphoric mood. The patient is not nervous/anxious.     Past Medical History:  Diagnosis Date  . Atrial flutter ([place])     . Cancer ([place])    skin  . Coronary artery disease involving native coronary artery of native heart without angina pectoris 04/30/2010   LHC 9/09:  S/p BMS to pLAD // Myoview 4/18: No ischemia, low risk, not gated  . Hyperlipidemia   . Hypertension   . Obesity (BMI 30-39.9) 09/28/2015  . Overweight(278.02)   . Permanent atrial fibrillation ([place]) 10/25/2014   Echo 10/14: EF 55-65%, mild MR, mod LAE, PASP 31 mmHg // Holter 9/16:  AFib, avg HR 70, rare HR up to 150, rare brady with 35 bpm and 3 sec pause during HS // Echo 4/18: EF 55-60, no RWMA, mild LAE, moderate RAE, trivial TR, PASP 28  . Syncope and collapse 03/29/2010     Family History  Problem Relation Age of Onset  . Diabetes Father   . Heart disease Father   . Heart attack Maternal Uncle        early 36's  . Cancer Maternal Grandmother        unknown ?leukemia     Social History   Socioeconomic History  . Marital status: Single    Spouse name: Not on file  . Number of children: Not on file  . Years of education: Not on file  . Highest education level: Not on file  Occupational History  . Not on file  Social Needs  . Financial resource strain: Not on file  . Food insecurity:    Worry: Not on file    Inability: Not on file  . Transportation needs:    Medical: Not on file  Non-medical: Not on file  Tobacco Use  . Smoking status: Former Smoker    Packs/day: 1.00    Years: 30.00    Pack years: 30.00    Types: Cigarettes    Last attempt to quit: 08/26/1996    Years since quitting: 21.8  . Smokeless tobacco: Never Used  Substance and Sexual Activity  . Alcohol use: Yes    Comment: previously heavy, now trying to cut back  . Drug use: No  . Sexual activity: Not on file  Lifestyle  . Physical activity:    Days per week: Not on file    Minutes per session: Not on file  . Stress: Not on file  Relationships  . Social connections:    Talks on phone: Not on file    Gets together: Not on file    Attends  religious service: Not on file    Active member of club or organization: Not on file    Attends meetings of clubs or organizations: Not on file    Relationship status: Not on file  . Intimate partner violence:    Fear of current or ex partner: Not on file    Emotionally abused: Not on file    Physically abused: Not on file    Forced sexual activity: Not on file  Other Topics Concern  . Not on file  Social History Narrative  . Not on file  has lived in [place], [place], [place] No [place] exposures Worked as Estate agent, in a lab situation. May have had some fumes exposure.   Allergies  Allergen Reactions  . Penicillins Rash    Has patient had a PCN reaction causing immediate rash, facial/tongue/throat swelling, SOB or lightheadedness with hypotension: Yes Has patient had a PCN reaction causing severe rash involving mucus membranes or skin necrosis: No Has patient had a PCN reaction that required hospitalization: No Has patient had a PCN reaction occurring within the last 10 years: No If all of the above answers are "NO", then may proceed with Cephalosporin use.     Outpatient Medications Prior to Visit  Medication Sig Dispense Refill  . acetaminophen (TYLENOL) 500 MG tablet Take 500 mg by mouth every 6 (six) hours as needed for headache (pain).    Marland Kitchen allopurinol (ZYLOPRIM) 100 MG tablet Take 1 tablet (100 mg total) by mouth daily. 90 tablet 3  . atorvastatin (LIPITOR) 80 MG tablet Take 1 tablet (80 mg total) by mouth daily. 90 tablet 3  . cholecalciferol (VITAMIN D) 1000 UNITS tablet Take 2,000 Units by mouth daily.    . colchicine (COLCRYS) 0.6 MG tablet Take 1 tablet (0.6 mg total) by mouth daily. 30 tablet 3  . lisinopril (PRINIVIL,ZESTRIL) 40 MG tablet TAKE 1 TABLET BY MOUTH EVERY MORNING 90 tablet 3  . metFORMIN (GLUCOPHAGE) 500 MG tablet TAKE 1 TABLET BY MOUTH EVERY DAY WITH SUPPER 90 tablet 0  . metoprolol succinate (TOPROL-XL) 100 MG 24 hr tablet Take 1 tablet (100 mg total) by  mouth 2 (two) times daily. 180 tablet 3  . Polyethyl Glycol-Propyl Glycol (SYSTANE OP) Place 1 drop into both eyes 2 (two) times daily as needed (dry eyes).    Marland Kitchen PRESCRIPTION MEDICATION Inhale into the lungs at bedtime.    [name] 20 MG TABS tablet TAKE 1 TABLET (20 MG TOTAL) BY MOUTH DAILY WITH SUPPER. 90 tablet 1  . azithromycin (ZITHROMAX) 250 MG tablet     . metroNIDAZOLE (FLAGYL) 500 MG tablet  No facility-administered medications prior to visit.         Objective:   Physical Exam Vitals:   07/12/18 1501  BP: 136/88  Pulse: 79  SpO2: 97%  Weight: 273 lb (123.8 kg)  Height: 6' 1.75" (1.873 m)   Gen: Pleasant, overwt, in no distress,  normal affect  ENT: No lesions,  mouth clear,  oropharynx clear, no postnasal drip  Neck: No JVD, no stridor  Lungs: No use of accessory muscles, no wheeze or crackles.   Cardiovascular: RRR, heart sounds normal, no murmur or gallops, no peripheral edema  Musculoskeletal: No deformities, no cyanosis or clubbing  Neuro: alert, non focal  Skin: Warm, no lesions or rash     Assessment & Plan:  Abnormal CT of the chest Very subtle changes in the right upper lobe airways that could be consistent with mild bronchiectasis.  I do not see any surrounding micronodular disease or inflammatory change.  He has some small pulmonary nodules all smaller than 6 mm.  Given his tobacco history we have decided that we will follow for interval change.  He will have a scan in July 2020.  Hemoptysis I think that this was a self-limited episode due to his vigorous cough and his anticoagulation.  I do not see anything on his CT chest to explain hemoptysis.  Since it happened only once I do not think he needs bronchoscopy.  We could reconsider if it returns.  History of tobacco use He needs pulmonary function testing to quantify any degree of airflow obstruction.  Depending on the results and depending on his clinical status we will decide whether he might  want to undertake a trial of bronchodilators.  Baltazar Apo, MD, PhD 07/12/2018, 4:53 PM [facility] [phone] or if no answer [phone]

## 2018-07-12 NOTE — Assessment & Plan Note (Signed)
Very subtle changes in the right upper lobe airways that could be consistent with mild bronchiectasis.  I do not see any surrounding micronodular disease or inflammatory change.  He has some small pulmonary nodules all smaller than 6 mm.  Given his tobacco history we have decided that we will follow for interval change.  He will have a scan in July 2020.

## 2018-07-12 NOTE — Patient Instructions (Signed)
We will plan to repeat your CT scan of the chest without contrast in July 2020 to follow small pulmonary nodules We will perform full pulmonary function testing at your next office visit Please call us if you develop any increase in cough or if you see blood in your mucus again. Follow with Dr Lamonte Sakai next available with full PFT.

## 2018-07-12 NOTE — Assessment & Plan Note (Signed)
He needs pulmonary function testing to quantify any degree of airflow obstruction.  Depending on the results and depending on his clinical status we will decide whether he might want to undertake a trial of bronchodilators.

## 2018-07-12 NOTE — Assessment & Plan Note (Signed)
I think that this was a self-limited episode due to his vigorous cough and his anticoagulation.  I do not see anything on his CT chest to explain hemoptysis.  Since it happened only once I do not think he needs bronchoscopy.  We could reconsider if it returns.

## 2018-07-18 ENCOUNTER — Other Ambulatory Visit: Payer: Self-pay | Admitting: Family Medicine

## 2018-07-18 DIAGNOSIS — M1A0710 Idiopathic chronic gout, right ankle and foot, without tophus (tophi): Secondary | ICD-10-CM

## 2018-07-24 ENCOUNTER — Other Ambulatory Visit: Payer: Self-pay | Admitting: Family Medicine

## 2018-07-24 DIAGNOSIS — M1A0710 Idiopathic chronic gout, right ankle and foot, without tophus (tophi): Secondary | ICD-10-CM

## 2018-08-26 ENCOUNTER — Ambulatory Visit (INDEPENDENT_AMBULATORY_CARE_PROVIDER_SITE_OTHER): Payer: Medicare Other | Admitting: Emergency Medicine

## 2018-08-26 ENCOUNTER — Encounter: Payer: Self-pay | Admitting: Emergency Medicine

## 2018-08-26 DIAGNOSIS — Z87891 Personal history of nicotine dependence: Secondary | ICD-10-CM | POA: Diagnosis not present

## 2018-08-26 DIAGNOSIS — I2510 Atherosclerotic heart disease of native coronary artery without angina pectoris: Secondary | ICD-10-CM | POA: Diagnosis not present

## 2018-08-26 DIAGNOSIS — Z720 Tobacco use: Secondary | ICD-10-CM | POA: Diagnosis not present

## 2018-08-26 DIAGNOSIS — R9389 Abnormal findings on diagnostic imaging of other specified body structures: Secondary | ICD-10-CM

## 2018-08-26 DIAGNOSIS — R042 Hemoptysis: Secondary | ICD-10-CM

## 2018-08-26 LAB — PULMONARY FUNCTION TEST
DL/VA % pred: 96 %
DL/VA: 4.59 ml/min/mmHg/L
DLCO unc % pred: 74 %
DLCO unc: 27.03 ml/min/mmHg
FEF 25-75 Post: 3.11 L/sec
FEF 25-75 Pre: 2.94 L/sec
FEF2575-%Change-Post: 5 %
FEF2575-%Pred-Post: 106 %
FEF2575-%Pred-Pre: 101 %
FEV1-%Change-Post: 0 %
FEV1-%Pred-Post: 79 %
FEV1-%Pred-Pre: 79 %
FEV1-Post: 2.97 L
FEV1-Pre: 2.98 L
FEV1FVC-%Change-Post: 4 %
FEV1FVC-%Pred-Pre: 109 %
FEV6-%Change-Post: -4 %
FEV6-%Pred-Post: 73 %
FEV6-%Pred-Pre: 76 %
FEV6-Post: 3.51 L
FEV6-Pre: 3.66 L
FEV6FVC-%Pred-Post: 105 %
FEV6FVC-%Pred-Pre: 105 %
FVC-%Change-Post: -4 %
FVC-%Pred-Post: 69 %
FVC-%Pred-Pre: 72 %
FVC-Post: 3.51 L
FVC-Pre: 3.66 L
Post FEV1/FVC ratio: 85 %
Post FEV6/FVC ratio: 100 %
Pre FEV1/FVC ratio: 81 %
Pre FEV6/FVC Ratio: 100 %
RV % pred: 89 %
RV: 2.29 L
TLC % pred: 78 %
TLC: 6.02 L

## 2018-08-26 NOTE — Patient Instructions (Signed)
Your pulmonary function testing shows some evidence for mild restriction of the size of your breaths.  There is no evidence for significant airflow obstruction or COPD.  This is good news. We will plan to repeat your CT scan of the chest in July 2020 to follow your small pulmonary nodules. Follow with Dr. Lamonte Sakai in July after your CT scan or sooner if you have any problems.  In particular please call us and come in to be seen if you have any more blood in your sputum.

## 2018-08-26 NOTE — Progress Notes (Signed)
PFT done today. 

## 2018-08-26 NOTE — Progress Notes (Signed)
Subjective:    Patient ID: Arthur Rich, male    DOB: June 02, 1951, 67 y.o.   MRN: [id number]   HPI 67 year old obese former smoker (30 pack years) with a history of A. fib/flutter on anticoagulation, coronary disease, hypertension.  He is referred today for an episode of hemoptysis and to review his CT scan of the chest. He was well until he had an episode of heavy cough in mid July, saw some bright blood in the sputum - about a teaspoon mixed w mucous, happened twice.  Denies any epistaxis. He does not have a lot of cough, although he does sometimes need to clear mucous associated w meals.   CT scan of the chest 05/19/2018 reviewed by me shows some very mild prominence of some peripheral right lower lobe airways without definite bronchiectasis.  There was no mass or consolidation noted.  There is a 5 mm subpleural pulmonary nodule at the right minor fissure and some smaller ones along the right major fissure.  There is groundglass density in the medial right lower lobe that has been stable compared with an abdominal CT that was done in 2018.   ROV 08/26/18 --67 year old gentleman with a history of tobacco use, CAD, hypertension on anticoagulation for A. fib/flutter seen in late Sept after he had hemoptysis following some vigorous coughing.  This appeared to be self-limited.  A CT scan of his chest was done 05/19/2018 that showed some possible mild bronchiectatic change in the right upper lobe, some very small scattered pulmonary nodules all less than 6 mm, some groundglass medial right lower lobe infiltrate that was stable compared with 2018.  Given his history of tobacco we performed pulmonary function testing today which I reviewed.  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  No significant bronchodilator response, restricted volumes and decreased diffusion capacity that corrects to the normal range when adjusted for alveolar volume.  His flow volume loop  shows some intermittent flattening of his inspiratory component consistent with variable upper airway obstruction.     Review of Systems  Constitutional: Negative for fever and unexpected weight change.  HENT: Negative for congestion, dental problem, ear pain, nosebleeds, postnasal drip, rhinorrhea, sinus pressure, sneezing, sore throat and trouble swallowing.   Eyes: Negative for redness and itching.  Respiratory: Positive for cough and shortness of breath. Negative for chest tightness and wheezing.   Cardiovascular: Negative for palpitations and leg swelling.  Gastrointestinal: Negative for nausea and vomiting.  Genitourinary: Negative for dysuria.  Musculoskeletal: Negative for joint swelling.  Skin: Negative for rash.  Neurological: Negative for headaches.  Hematological: Does not bruise/bleed easily.  Psychiatric/Behavioral: Negative for dysphoric mood. The patient is not nervous/anxious.     Past Medical History:  Diagnosis Date  . Atrial flutter ([place])   . Cancer ([place])    skin  . Coronary artery disease involving native coronary artery of native heart without angina pectoris 04/30/2010   LHC 9/09:  S/p BMS to pLAD // Myoview 4/18: No ischemia, low risk, not gated  . Hyperlipidemia   . Hypertension   . Obesity (BMI 30-39.9) 09/28/2015  . Overweight(278.02)   . Permanent atrial fibrillation 10/25/2014   Echo 10/14: EF 55-65%, mild MR, mod LAE, PASP 31 mmHg // Holter 9/16:  AFib, avg HR 70, rare HR up to 150, rare brady with 35 bpm and 3 sec pause during HS // Echo 4/18: EF 55-60, no RWMA, mild LAE, moderate RAE, trivial TR, PASP 28  .  Syncope and collapse 03/29/2010     Family History  Problem Relation Age of Onset  . Diabetes Father   . Heart disease Father   . Heart attack Maternal Uncle        early 61's  . Cancer Maternal Grandmother        unknown ?leukemia     Social History   Socioeconomic History  . Marital status: Single    Spouse name: Not on file  .  Number of children: Not on file  . Years of education: Not on file  . Highest education level: Not on file  Occupational History  . Not on file  Social Needs  . Financial resource strain: Not on file  . Food insecurity:    Worry: Not on file    Inability: Not on file  . Transportation needs:    Medical: Not on file    Non-medical: Not on file  Tobacco Use  . Smoking status: Former Smoker    Packs/day: 1.00    Years: 30.00    Pack years: 30.00    Types: Cigarettes    Last attempt to quit: 08/26/1996    Years since quitting: 22.0  . Smokeless tobacco: Never Used  Substance and Sexual Activity  . Alcohol use: Yes    Comment: previously heavy, now trying to cut back  . Drug use: No  . Sexual activity: Not on file  Lifestyle  . Physical activity:    Days per week: Not on file    Minutes per session: Not on file  . Stress: Not on file  Relationships  . Social connections:    Talks on phone: Not on file    Gets together: Not on file    Attends religious service: Not on file    Active member of club or organization: Not on file    Attends meetings of clubs or organizations: Not on file    Relationship status: Not on file  . Intimate partner violence:    Fear of current or ex partner: Not on file    Emotionally abused: Not on file    Physically abused: Not on file    Forced sexual activity: Not on file  Other Topics Concern  . Not on file  Social History Narrative  . 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   Allergies  Allergen Reactions  . Penicillins Rash    Has patient had a PCN reaction causing immediate rash, facial/tongue/throat swelling, SOB or lightheadedness with hypotension: Yes Has patient had a PCN reaction causing severe rash involving mucus membranes or skin necrosis: No Has patient had a PCN reaction that required hospitalization: No Has patient had a  PCN reaction occurring within the last 10 years: No If all of the above answers are "NO", then may proceed with Cephalosporin use.     Outpatient Medications Prior to Visit  Medication Sig Dispense Refill  . acetaminophen (TYLENOL) 500 MG tablet Take 500 mg by mouth every 6 (six) hours as needed for headache (pain).    Marland Kitchen allopurinol (ZYLOPRIM) 100 MG tablet Take 1 tablet (100 mg total) by mouth daily. 30 tablet 0  . atorvastatin (LIPITOR) 80 MG tablet Take 1 tablet (80 mg total) by mouth daily. 90 tablet 3  . cholecalciferol (VITAMIN D) 1000 UNITS tablet Take 2,000 Units by mouth daily.    Marland Kitchen lisinopril (PRINIVIL,ZESTRIL)  40 MG tablet TAKE 1 TABLET BY MOUTH EVERY MORNING 90 tablet 3  . metFORMIN (GLUCOPHAGE) 500 MG tablet TAKE 1 TABLET BY MOUTH EVERY DAY WITH SUPPER 90 tablet 0  . metoprolol succinate (TOPROL-XL) 100 MG 24 hr tablet Take 1 tablet (100 mg total) by mouth 2 (two) times daily. 180 tablet 3  . Polyethyl Glycol-Propyl Glycol (SYSTANE OP) Place 1 drop into both eyes 2 (two) times daily as needed (dry eyes).    [name] 20 MG TABS tablet TAKE 1 TABLET (20 MG TOTAL) BY MOUTH DAILY WITH SUPPER. 90 tablet 1  . allopurinol (ZYLOPRIM) 100 MG tablet TAKE 1 TABLET BY MOUTH EVERY DAY 30 tablet 0  . colchicine (COLCRYS) 0.6 MG tablet Take 1 tablet (0.6 mg total) by mouth daily. 30 tablet 3  . PRESCRIPTION MEDICATION Inhale into the lungs at bedtime.     No facility-administered medications prior to visit.         Objective:   Physical Exam Vitals:   08/26/18 1402  BP: 130/74  Pulse: 91  SpO2: 97%  Weight: 275 lb (124.7 kg)  Height: 6\' 1"  (1.854 m)   Gen: Pleasant, overwt, in no distress,  normal affect  ENT: No lesions,  mouth clear,  oropharynx clear, no postnasal drip  Neck: No JVD, no stridor  Lungs: No use of accessory muscles, no wheeze or crackles.   Cardiovascular: RRR, heart sounds normal, no murmur or gallops, no peripheral edema  Musculoskeletal: No deformities,  no cyanosis or clubbing  Neuro: alert, non focal  Skin: Warm, no lesions or rash     Assessment & Plan:  Hemoptysis Appeared to be self-limited and is now resolved.  If he has a recurrence then we will expand his work-up, arrange for bronchoscopy  Abnormal CT of the chest Pulmonary nodules and a stable right sided groundglass infiltrate.  We are planning for repeat CT scan in July 2020 to ensure interval stability.  History of tobacco use Pulmonary function testing with some evidence of restriction, possible mixed disease but certainly no significant obstruction present.  He does have flattening of his inspiratory loop that could suggest a variable upper airway obstruction.  I do not think he needs bronchodilators at this time  Baltazar Apo, MD, PhD 08/26/2018, 2:18 PM [facility] [phone] or if no answer [phone]

## 2018-08-26 NOTE — Assessment & Plan Note (Signed)
Pulmonary nodules and a stable right sided groundglass infiltrate.  We are planning for repeat CT scan in July 2020 to ensure interval stability.

## 2018-08-26 NOTE — Assessment & Plan Note (Signed)
Appeared to be self-limited and is now resolved.  If he has a recurrence then we will expand his work-up, arrange for bronchoscopy

## 2018-08-26 NOTE — Assessment & Plan Note (Signed)
Pulmonary function testing with some evidence of restriction, possible mixed disease but certainly no significant obstruction present.  He does have flattening of his inspiratory loop that could suggest a variable upper airway obstruction.  I do not think he needs bronchodilators at this time

## 2018-09-16 ENCOUNTER — Other Ambulatory Visit: Payer: Self-pay | Admitting: Family Medicine

## 2018-09-17 ENCOUNTER — Other Ambulatory Visit: Payer: Self-pay | Admitting: Family Medicine

## 2018-09-17 DIAGNOSIS — I160 Hypertensive urgency: Secondary | ICD-10-CM | POA: Diagnosis not present

## 2018-09-17 DIAGNOSIS — G454 Transient global amnesia: Secondary | ICD-10-CM | POA: Diagnosis not present

## 2018-09-17 DIAGNOSIS — J3489 Other specified disorders of nose and nasal sinuses: Secondary | ICD-10-CM | POA: Diagnosis not present

## 2018-09-17 DIAGNOSIS — I700 Atherosclerosis of aorta: Secondary | ICD-10-CM | POA: Diagnosis not present

## 2018-09-17 DIAGNOSIS — I482 Chronic atrial fibrillation, unspecified: Secondary | ICD-10-CM | POA: Diagnosis not present

## 2018-09-17 DIAGNOSIS — R410 Disorientation, unspecified: Secondary | ICD-10-CM | POA: Diagnosis not present

## 2018-09-17 DIAGNOSIS — E7800 Pure hypercholesterolemia, unspecified: Secondary | ICD-10-CM | POA: Diagnosis not present

## 2018-09-17 DIAGNOSIS — M1A0710 Idiopathic chronic gout, right ankle and foot, without tophus (tophi): Secondary | ICD-10-CM

## 2018-09-17 DIAGNOSIS — I4891 Unspecified atrial fibrillation: Secondary | ICD-10-CM | POA: Diagnosis not present

## 2018-09-17 DIAGNOSIS — R930 Abnormal findings on diagnostic imaging of skull and head, not elsewhere classified: Secondary | ICD-10-CM | POA: Diagnosis not present

## 2018-09-17 DIAGNOSIS — E119 Type 2 diabetes mellitus without complications: Secondary | ICD-10-CM | POA: Diagnosis not present

## 2018-09-17 DIAGNOSIS — G459 Transient cerebral ischemic attack, unspecified: Secondary | ICD-10-CM | POA: Diagnosis not present

## 2018-09-17 DIAGNOSIS — M109 Gout, unspecified: Secondary | ICD-10-CM | POA: Diagnosis not present

## 2018-09-17 DIAGNOSIS — Z87891 Personal history of nicotine dependence: Secondary | ICD-10-CM | POA: Diagnosis not present

## 2018-09-18 DIAGNOSIS — Z955 Presence of coronary angioplasty implant and graft: Secondary | ICD-10-CM | POA: Diagnosis not present

## 2018-09-18 DIAGNOSIS — R930 Abnormal findings on diagnostic imaging of skull and head, not elsewhere classified: Secondary | ICD-10-CM | POA: Diagnosis not present

## 2018-09-18 DIAGNOSIS — E119 Type 2 diabetes mellitus without complications: Secondary | ICD-10-CM | POA: Diagnosis present

## 2018-09-18 DIAGNOSIS — I10 Essential (primary) hypertension: Secondary | ICD-10-CM | POA: Diagnosis not present

## 2018-09-18 DIAGNOSIS — E7800 Pure hypercholesterolemia, unspecified: Secondary | ICD-10-CM | POA: Diagnosis present

## 2018-09-18 DIAGNOSIS — I4891 Unspecified atrial fibrillation: Secondary | ICD-10-CM | POA: Diagnosis not present

## 2018-09-18 DIAGNOSIS — I482 Chronic atrial fibrillation, unspecified: Secondary | ICD-10-CM | POA: Diagnosis present

## 2018-09-18 DIAGNOSIS — Z7984 Long term (current) use of oral hypoglycemic drugs: Secondary | ICD-10-CM | POA: Diagnosis not present

## 2018-09-18 DIAGNOSIS — Z87891 Personal history of nicotine dependence: Secondary | ICD-10-CM | POA: Diagnosis not present

## 2018-09-18 DIAGNOSIS — I160 Hypertensive urgency: Secondary | ICD-10-CM | POA: Diagnosis present

## 2018-09-18 DIAGNOSIS — Z880 Allergy status to penicillin: Secondary | ICD-10-CM | POA: Diagnosis not present

## 2018-09-18 DIAGNOSIS — Z7901 Long term (current) use of anticoagulants: Secondary | ICD-10-CM | POA: Diagnosis not present

## 2018-09-18 DIAGNOSIS — J3489 Other specified disorders of nose and nasal sinuses: Secondary | ICD-10-CM | POA: Diagnosis not present

## 2018-09-18 DIAGNOSIS — G459 Transient cerebral ischemic attack, unspecified: Secondary | ICD-10-CM | POA: Diagnosis not present

## 2018-09-18 DIAGNOSIS — M109 Gout, unspecified: Secondary | ICD-10-CM | POA: Diagnosis not present

## 2018-09-18 DIAGNOSIS — E1169 Type 2 diabetes mellitus with other specified complication: Secondary | ICD-10-CM | POA: Diagnosis not present

## 2018-09-18 DIAGNOSIS — I2510 Atherosclerotic heart disease of native coronary artery without angina pectoris: Secondary | ICD-10-CM | POA: Diagnosis present

## 2018-09-18 DIAGNOSIS — G454 Transient global amnesia: Secondary | ICD-10-CM | POA: Diagnosis present

## 2018-09-18 DIAGNOSIS — I517 Cardiomegaly: Secondary | ICD-10-CM | POA: Diagnosis not present

## 2018-09-18 DIAGNOSIS — I700 Atherosclerosis of aorta: Secondary | ICD-10-CM | POA: Diagnosis not present

## 2018-09-25 ENCOUNTER — Encounter (HOSPITAL_COMMUNITY): Payer: Self-pay

## 2018-09-25 ENCOUNTER — Other Ambulatory Visit: Payer: Self-pay

## 2018-09-25 ENCOUNTER — Emergency Department (HOSPITAL_COMMUNITY)
Admission: EM | Admit: 2018-09-25 | Discharge: 2018-09-25 | Disposition: A | Discharge status: Home / Self Care | Payer: Medicare Other | Attending: Emergency Medicine | Admitting: Emergency Medicine

## 2018-09-25 ENCOUNTER — Emergency Department (HOSPITAL_COMMUNITY): Payer: Medicare Other

## 2018-09-25 DIAGNOSIS — Z7982 Long term (current) use of aspirin: Secondary | ICD-10-CM | POA: Diagnosis not present

## 2018-09-25 DIAGNOSIS — R000 Tachycardia, unspecified: Secondary | ICD-10-CM | POA: Diagnosis present

## 2018-09-25 DIAGNOSIS — Z87891 Personal history of nicotine dependence: Secondary | ICD-10-CM | POA: Diagnosis not present

## 2018-09-25 DIAGNOSIS — Z79899 Other long term (current) drug therapy: Secondary | ICD-10-CM | POA: Diagnosis not present

## 2018-09-25 DIAGNOSIS — E119 Type 2 diabetes mellitus without complications: Secondary | ICD-10-CM | POA: Diagnosis not present

## 2018-09-25 DIAGNOSIS — I2510 Atherosclerotic heart disease of native coronary artery without angina pectoris: Secondary | ICD-10-CM | POA: Insufficient documentation

## 2018-09-25 DIAGNOSIS — I10 Essential (primary) hypertension: Secondary | ICD-10-CM | POA: Diagnosis not present

## 2018-09-25 DIAGNOSIS — Z7901 Long term (current) use of anticoagulants: Secondary | ICD-10-CM | POA: Diagnosis not present

## 2018-09-25 DIAGNOSIS — Z85828 Personal history of other malignant neoplasm of skin: Secondary | ICD-10-CM | POA: Insufficient documentation

## 2018-09-25 DIAGNOSIS — I4891 Unspecified atrial fibrillation: Secondary | ICD-10-CM | POA: Diagnosis not present

## 2018-09-25 HISTORY — DX: Transient cerebral ischemic attack, unspecified: G45.9

## 2018-09-25 LAB — BASIC METABOLIC PANEL
Anion gap: 16 — ABNORMAL HIGH (ref 5–15)
BUN: 8 mg/dL (ref 8–23)
CO2: 19 mmol/L — ABNORMAL LOW (ref 22–32)
Calcium: 9 mg/dL (ref 8.9–10.3)
Chloride: 100 mmol/L (ref 98–111)
Creatinine, Ser: 0.95 mg/dL (ref 0.61–1.24)
GFR calc Af Amer: >60 mL/min (ref >60)
GFR calc non Af Amer: >60 mL/min (ref >60)
Glucose, Bld: 111 mg/dL — ABNORMAL HIGH (ref 70–99)
Potassium: 3.3 mmol/L — ABNORMAL LOW (ref 3.5–5.1)
Sodium: 135 mmol/L (ref 135–145)

## 2018-09-25 LAB — MAGNESIUM: Magnesium: 1.9 mg/dL (ref 1.7–2.4)

## 2018-09-25 LAB — I-STAT TROPONIN, ED: Troponin i, poc: 0 ng/mL (ref 0.00–0.08)

## 2018-09-25 LAB — CBC
HCT: 46.9 % (ref 39.0–52.0)
Hemoglobin: 15.4 g/dL (ref 13.0–17.0)
MCH: 29.8 pg (ref 26.0–34.0)
MCHC: 32.8 g/dL (ref 30.0–36.0)
MCV: 90.9 fL (ref 80.0–100.0)
Platelets: 226 10*3/uL (ref 150–400)
RBC: 5.16 MIL/uL (ref 4.22–5.81)
RDW: 13 % (ref 11.5–15.5)
WBC: 9.7 10*3/uL (ref 4.0–10.5)
nRBC: 0 % (ref 0.0–0.2)

## 2018-09-25 MED ORDER — LABETALOL HCL 5 MG/ML IV SOLN
20.0000 mg | Freq: Once | INTRAVENOUS | Status: AC
  Administered 2018-09-25: 20 mg via INTRAVENOUS
  Filled 2018-09-25: qty 4

## 2018-09-25 MED ORDER — LACTATED RINGERS IV BOLUS
1000.0000 mL | Freq: Once | INTRAVENOUS | Status: AC
  Administered 2018-09-25: 1000 mL via INTRAVENOUS

## 2018-09-25 MED ORDER — POTASSIUM CHLORIDE CRYS ER 20 MEQ PO TBCR
40.0000 meq | EXTENDED_RELEASE_TABLET | Freq: Once | ORAL | Status: AC
  Administered 2018-09-25: 40 meq via ORAL
  Filled 2018-09-25: qty 2

## 2018-09-25 MED ORDER — POTASSIUM CHLORIDE CRYS ER 20 MEQ PO TBCR: 40.0000 meq | EXTENDED_RELEASE_TABLET | Freq: Once | ORAL | Status: DC

## 2018-09-25 NOTE — ED Notes (Signed)
Nurse drawing labs. 

## 2018-09-25 NOTE — ED Provider Notes (Signed)
[facility] EMERGENCY DEPARTMENT Provider Note   CSN: [id number] Arrival date & time: 09/25/18  1836     History   Chief Complaint Chief Complaint  Patient presents with  . Hypertension  . Atrial Fibrillation    HPI Arthur Rich is a 67 y.o. male.  Patient is a 67 year old male with past medical history significant for chronic A. fib, hypertension, on Eliquis presenting for feeling of nausea and found to be tachycardic in triage.  Patient states that he has been feeling mildly "queasy" for the day.  However checked his blood pressure at home and it was elevated throughout the day so he got nervous and came to the emergency department. Adamantly denies chest pain, shortness of breath.  States that he took his blood pressure and it was above 160, prior to coming to the emergency department he took his metoprolol and his Eliquis this evening..     Past Medical History:  Diagnosis Date  . Atrial flutter ([place])   . Cancer ([place])    skin  . Coronary artery disease involving native coronary artery of native heart without angina pectoris 04/30/2010   LHC 9/09:  S/p BMS to pLAD // Myoview 4/18: No ischemia, low risk, not gated  . Hyperlipidemia   . Hypertension   . Obesity (BMI 30-39.9) 09/28/2015  . Overweight(278.02)   . Permanent atrial fibrillation 10/25/2014   Echo 10/14: EF 55-65%, mild MR, mod LAE, PASP 31 mmHg // Holter 9/16:  AFib, avg HR 70, rare HR up to 150, rare brady with 35 bpm and 3 sec pause during HS // Echo 4/18: EF 55-60, no RWMA, mild LAE, moderate RAE, trivial TR, PASP 28  . Syncope and collapse 03/29/2010  . TIA (transient ischemic attack)     Patient Active Problem List   Diagnosis Date Noted  . Abnormal CT of the chest 07/12/2018  . Hemoptysis 07/12/2018  . History of tobacco use 07/12/2018  . Controlled type 2 diabetes mellitus without complication, without long-term current use of insulin ([place]) 11/28/2016  . Obesity (BMI 30-39.9)  09/28/2015  . Vertigo 07/06/2015  . OSA (obstructive sleep apnea) 04/10/2015  . Excessive daytime sleepiness 04/10/2015  . Permanent atrial fibrillation 10/25/2014  . Anticoagulation adequate with anticoagulant therapy 07/20/2013  . Umbilical hernia 03/54/6568  . SYNCOPE 05/01/2010  . Mixed hyperlipidemia 04/30/2010  . ESSENTIAL HYPERTENSION, BENIGN 04/30/2010  . Coronary artery disease involving native coronary artery of native heart without angina pectoris 04/30/2010  . ATRIAL FLUTTER 04/30/2010    Past Surgical History:  Procedure Laterality Date  . CARDIAC CATHETERIZATION  9/09   PCI LAD (BMS)  . nasal septal surgery    . TONSILLECTOMY    . WISDOM TOOTH EXTRACTION          Home Medications    Prior to Admission medications   Medication Sig Start Date End Date Taking? Authorizing Provider  acetaminophen (TYLENOL) 500 MG tablet Take 500 mg by mouth every 6 (six) hours as needed for headache (pain).   Yes [provider]  allopurinol (ZYLOPRIM) 100 MG tablet TAKE 1 TABLET BY MOUTH EVERY DAY 09/20/18  Yes Copland, Gay Filler, MD  aspirin EC 81 MG tablet Take 81 mg by mouth daily.   Yes [provider]  atorvastatin (LIPITOR) 80 MG tablet Take 1 tablet (80 mg total) by mouth daily. Patient taking differently: Take 80 mg by mouth every evening.  05/07/18  Yes Isaiah Serge, NP  Cholecalciferol (VITAMIN D) 50  MCG (2000 UT) CAPS Take 2,000 Units by mouth daily.  07/01/13  Yes Allred, Jeneen Rinks, MD  lisinopril (PRINIVIL,ZESTRIL) 40 MG tablet TAKE 1 TABLET BY MOUTH EVERY MORNING Patient taking differently: Take 40 mg by mouth daily.  12/04/17  Yes Copland, Gay Filler, MD  metFORMIN (GLUCOPHAGE) 500 MG tablet TAKE 1 TABLET BY MOUTH EVERY DAY WITH SUPPER Patient taking differently: Take 500 mg by mouth daily with supper.  09/20/18  Yes Copland, Gay Filler, MD  metoprolol succinate (TOPROL-XL) 100 MG 24 hr tablet Take 1 tablet (100 mg total) by mouth 2 (two) times daily.  05/07/18  Yes Isaiah Serge, NP  Polyethyl Glycol-Propyl Glycol (SYSTANE OP) Place 1 drop into both eyes daily as needed (dry eyes).    Yes [provider]  XARELTO 20 MG TABS tablet TAKE 1 TABLET (20 MG TOTAL) BY MOUTH DAILY WITH SUPPER. Patient taking differently: Take 20 mg by mouth daily with supper.  06/22/18  Yes Belva Crome, MD  allopurinol (ZYLOPRIM) 100 MG tablet Take 1 tablet (100 mg total) by mouth daily. Patient not taking: Reported on 09/25/2018 07/19/18   Copland, Gay Filler, MD    Family History Family History  Problem Relation Age of Onset  . Diabetes Father   . Heart disease Father   . Heart attack Maternal Uncle        early 70's  . Cancer Maternal Grandmother        unknown ?leukemia    Social History Social History   Tobacco Use  . Smoking status: Former Smoker    Packs/day: 1.00    Years: 30.00    Pack years: 30.00    Types: Cigarettes    Last attempt to quit: 08/26/1996    Years since quitting: 22.0  . Smokeless tobacco: Never Used  Substance Use Topics  . Alcohol use: Yes    Comment: previously heavy, now trying to cut back  . Drug use: No     Allergies   Penicillins   Review of Systems Review of Systems  Constitutional: Positive for activity change and appetite change. Negative for chills and fever.  HENT: Negative for ear pain and sore throat.   Eyes: Negative for pain and visual disturbance.  Respiratory: Negative for cough and shortness of breath.   Cardiovascular: Negative for chest pain and palpitations.  Gastrointestinal: Negative for abdominal pain and vomiting.  Genitourinary: Negative for dysuria and hematuria.  Musculoskeletal: Negative for arthralgias and back pain.  Skin: Negative for color change and rash.  Neurological: Negative for seizures and syncope.  All other systems reviewed and are negative.    Physical Exam Updated Vital Signs BP 130/88   Pulse 83   Resp (!) 25   Ht 6\' 2"  (1.88 m)   Wt 122.5 kg    SpO2 96%   BMI 34.67 kg/m   Physical Exam  Constitutional: He appears well-developed and well-nourished.  HENT:  Head: Normocephalic and atraumatic.  Eyes: Conjunctivae are normal.  Neck: Neck supple.  Cardiovascular: Normal rate and regular rhythm.  No murmur heard. Pulmonary/Chest: Effort normal and breath sounds normal. No respiratory distress.  Abdominal: Soft. There is no tenderness.  Musculoskeletal: He exhibits no edema.  Neurological: He is alert.  Skin: Skin is warm and dry.  Psychiatric: He has a normal mood and affect.  Nursing note and vitals reviewed.    ED Treatments / Results  Labs (all labs ordered are listed, but only abnormal results are displayed) Labs Reviewed  BASIC  METABOLIC PANEL - Abnormal; Notable for the following components:      Result Value   Potassium 3.3 (*)    CO2 19 (*)    Glucose, Bld 111 (*)    Anion gap 16 (*)    All other components within normal limits  CBC  MAGNESIUM  I-STAT TROPONIN, ED    EKG EKG Interpretation  Date/Time:  Saturday September 25 2018 18:44:06 EST Ventricular Rate:  142 PR Interval:    QRS Duration: 84 QT Interval:  302 QTC Calculation: 465 R Axis:   20 Text Interpretation:  Atrial fibrillation Borderline repol abnrm, inferolateral leads No significant change since last tracing Confirmed by Blanchie Dessert ([id number]) on 09/25/2018 7:31:48 PM   Radiology Dg Chest Port 1 View  Result Date: 09/25/2018 CLINICAL DATA:  Hypertensive. Palpitations. History of coronary artery disease, atrial fibrillation and hypertension. EXAM: PORTABLE CHEST 1 VIEW COMPARISON:  CT chest May 19, 2018 FINDINGS: Cardiac silhouette is mildly enlarged. Mediastinal silhouette is unremarkable. No pleural effusion or focal consolidation. No pneumothorax. Soft tissue planes and included osseous structures are nonacute. Advanced AC osteoarthrosis. IMPRESSION: Mild cardiomegaly.  No acute pulmonary process. Electronically Signed   By: Elon Alas M.D.   On: 09/25/2018 19:35    Procedures Procedures (including critical care time)  Medications Ordered in ED Medications  labetalol (NORMODYNE,TRANDATE) injection 20 mg (20 mg Intravenous Given 09/25/18 2009)  lactated ringers bolus 1,000 mL (0 mLs Intravenous Stopped 09/25/18 2105)  potassium chloride SA (K-DUR,KLOR-CON) CR tablet 40 mEq (40 mEq Oral Given 09/25/18 2041)     Initial Impression / Assessment and Plan / ED Course  I have reviewed the triage vital signs and the nursing notes.  Pertinent labs & imaging results that were available during my care of the patient were reviewed by me and considered in my medical decision making (see chart for details).     Patient is a 67 year old male with past medical history significant for hypertension, A. fib on Eliquis, recent hospitalization for hypertensive emergency and TIA presenting for elevated blood pressure at home and found to be in A. fib with RVR at triage at the emergency department.  Upon arrival patient adamantly denies chest pain, initial troponin negative.  EKG was reviewed myself and my attending physician/A. fib with RVR, no ST segment elevations or new T wave inversions noted. Patient was given 20mg  labetalol IV for HTN control. Patient ambulated without difficulty, HR did not go above 100.  Labs unremarkable.  Patient counseled to follow-up with primary care doctor for better blood pressure control and increase in medication.  All questions answered.  Patient safe for discharge.  Strict return precautions were discussed.  Final Clinical Impressions(s) / ED Diagnoses   Final diagnoses:  Hypertension, unspecified type  Atrial fibrillation with RVR [facility])    ED Discharge Orders    None       Erskine Squibb, MD 09/25/18 3154    Blanchie Dessert, MD 09/26/18 0086    Blanchie Dessert, MD 10/15/18 [phone]

## 2018-09-25 NOTE — ED Notes (Signed)
Pt ambulated in hallway. Pt denied dizziness and stated he feels back to normal.

## 2018-09-25 NOTE — ED Triage Notes (Signed)
Pt reports that he was diagnosed with a TIA last week. Pt reports that he was checking his blood pressure today and it was elevated. Pt reports that his heart rate has also been elevated today. Pt does have a past medical history of a-fib.

## 2018-10-02 NOTE — Progress Notes (Addendum)
[facility] at [facility] [address] [place], [place] [address] [phone]  Date:  10/04/2018   Name:  Arthur Rich   DOB:  [birth year]   MRN:  [id number]  PCP:  [name], MD    Chief Complaint: Annual Exam (hospital follow up? , TIA dx in november in [place]. ) and Hypertension (high readings at home, 159/105, went to [facility])   History of Present Illness:  Arthur Rich is a 67 y.o. very pleasant male patient who presents with the following:  Here today for annual visit and labs.  History of diabetes, obesity, sleep apnea, atrial fib on anticoagulation with Xarelto, hyperlipidemia, hypertension, CAD.  Last seen by myself about a year ago. However in the meantime he did have a TIA, he was hospitalized for this in November at an outside hospital in [place].  He was visiting his mom and and hung up some lights for her, then forgot totally about doing this.  His sister was concerned and took him to the ER where he was admitted  HOSPITAL COURSE  Admitting Diagnosis: TIA (transient ischemic attack) [G45.9] Atrial fibrillation with RVR ([place]) [I48.91] Hypertensive urgency [I16.0] Hospital Course (1 Days): Clary was admitted to the hospital. He continued with some confusion and memory loss from the events that occurred on Friday. He had no other focal neurologic deficits. An MRI of the brain showed a tiny area of restricted diffusion in the right cerebral hemisphere which was felt to possibly represent minimal ischemia in that area. In neurology consultation was obtained. The neurologist felt the patient's symptoms were most consistent with transient global amnesia but also recommended continuing aspirin and statin as well as the patient Xarelto for possible TIA. Carotid ultrasound showed no significant stenosis bilaterally. Echocardiogram revealed a preserved ejection fraction. Heart rates were better controlled after  admission. Patient notes that he missed his medications on Friday morning. He remains in atrial fibrillation which is chronic. He will be continued on his Xarelto as well as Toprol-XL. At this point he is discharged home in stable condition. He should follow up with his family physician in [place] upon return and should see a neurologist as well.  He was also in the ER about 2 weeks ago with hypertensive urgency.  He was also noticed to be in A. fib with RVR, was given IV labetalol and tachycardia resolved  He had a metabolic profile and CBC while in the ER He was already on xarelto from his TIA episode  He is a patient of [facility] cardiology, and needs to follow-up. He does not have an appt yet-  I will message Richardson Dopp for him.  I am not sure who his primary cardiologist is, but he has seen Mr. Kathlen Mody most recently.  Eye exam: he will do, overdue  Foot exam: Done today Due for pneumovax 23- will give today  Shingrix?  BP Readings from Last 3 Encounters:  10/04/18 128/78  09/25/18 130/88  08/26/18 130/74   Allopurinol Asa 81 lipitor 80 Lisinopril Metformin toprol xl xarelto  He notes that on occasion he will notice nausea and not feeling well in general This occurs sporadically, he will go awhile without having any and they may have a few episodes in closer succession Exercise may bring this on but sometimes can occur spontaneously Resting may help  No CP or unusual SOB assoiciated  No abd pain No fever associated  He is not really able to determine what is causing these episodes.  Negative stress in April of 2018   Patient Active Problem List   Diagnosis Date Noted  . Abnormal CT of the chest 07/12/2018  . Hemoptysis 07/12/2018  . History of tobacco use 07/12/2018  . Controlled type 2 diabetes mellitus without complication, without long-term current use of insulin ([place]) 11/28/2016  . Obesity (BMI 30-39.9) 09/28/2015  . Vertigo 07/06/2015  . OSA (obstructive  sleep apnea) 04/10/2015  . Excessive daytime sleepiness 04/10/2015  . Permanent atrial fibrillation 10/25/2014  . Anticoagulation adequate with anticoagulant therapy 07/20/2013  . Umbilical hernia 73/71/0626  . SYNCOPE 05/01/2010  . Mixed hyperlipidemia 04/30/2010  . ESSENTIAL HYPERTENSION, BENIGN 04/30/2010  . Coronary artery disease involving native coronary artery of native heart without angina pectoris 04/30/2010  . ATRIAL FLUTTER 04/30/2010    Past Medical History:  Diagnosis Date  . Atrial flutter ([place])   . Cancer ([place])    skin  . Coronary artery disease involving native coronary artery of native heart without angina pectoris 04/30/2010   LHC 9/09:  S/p BMS to pLAD // Myoview 4/18: No ischemia, low risk, not gated  . Hyperlipidemia   . Hypertension   . Obesity (BMI 30-39.9) 09/28/2015  . Overweight(278.02)   . Permanent atrial fibrillation 10/25/2014   Echo 10/14: EF 55-65%, mild MR, mod LAE, PASP 31 mmHg // Holter 9/16:  AFib, avg HR 70, rare HR up to 150, rare brady with 35 bpm and 3 sec pause during HS // Echo 4/18: EF 55-60, no RWMA, mild LAE, moderate RAE, trivial TR, PASP 28  . Syncope and collapse 03/29/2010  . TIA (transient ischemic attack)     Past Surgical History:  Procedure Laterality Date  . CARDIAC CATHETERIZATION  9/09   PCI LAD ([facility])  . nasal septal surgery    . TONSILLECTOMY    . WISDOM TOOTH EXTRACTION      Social History   Tobacco Use  . Smoking status: Former Smoker    Packs/day: 1.00    Years: 30.00    Pack years: 30.00    Types: Cigarettes    Last attempt to quit: 08/26/1996    Years since quitting: 22.1  . Smokeless tobacco: Never Used  Substance Use Topics  . Alcohol use: Yes    Comment: previously heavy, now trying to cut back  . Drug use: No    Family History  Problem Relation Age of Onset  . Diabetes Father   . Heart disease Father   . Heart attack Maternal Uncle        early 84's  . Cancer Maternal Grandmother         unknown ?leukemia    Allergies  Allergen Reactions  . Penicillins Rash    Has patient had a PCN reaction causing immediate rash, facial/tongue/throat swelling, SOB or lightheadedness with hypotension: Yes Has patient had a PCN reaction causing severe rash involving mucus membranes or skin necrosis: No Has patient had a PCN reaction that required hospitalization: No Has patient had a PCN reaction occurring within the last 10 years: No If all of the above answers are "NO", then may proceed with Cephalosporin use.    Medication list has been reviewed and updated.  Current Outpatient Medications on File Prior to Visit  Medication Sig Dispense Refill  . acetaminophen (TYLENOL) 500 MG tablet Take 500 mg by mouth every 6 (six) hours as needed for headache (pain).    Marland Kitchen allopurinol (ZYLOPRIM)  100 MG tablet Take 1 tablet (100 mg total) by mouth daily. 30 tablet 0  . allopurinol (ZYLOPRIM) 100 MG tablet TAKE 1 TABLET BY MOUTH EVERY DAY 30 tablet 0  . aspirin EC 81 MG tablet Take 81 mg by mouth daily.    Marland Kitchen atorvastatin (LIPITOR) 80 MG tablet Take 1 tablet (80 mg total) by mouth daily. (Patient taking differently: Take 80 mg by mouth every evening. ) 90 tablet 3  . Cholecalciferol (VITAMIN D) 50 MCG (2000 UT) CAPS Take 2,000 Units by mouth daily.     Marland Kitchen lisinopril (PRINIVIL,ZESTRIL) 40 MG tablet TAKE 1 TABLET BY MOUTH EVERY MORNING (Patient taking differently: Take 40 mg by mouth daily. ) 90 tablet 3  . metFORMIN (GLUCOPHAGE) 500 MG tablet TAKE 1 TABLET BY MOUTH EVERY DAY WITH SUPPER (Patient taking differently: Take 500 mg by mouth daily with supper. ) 30 tablet 0  . metoprolol succinate (TOPROL-XL) 100 MG 24 hr tablet Take 1 tablet (100 mg total) by mouth 2 (two) times daily. 180 tablet 3  . Polyethyl Glycol-Propyl Glycol (SYSTANE OP) Place 1 drop into both eyes daily as needed (dry eyes).     [name] 20 MG TABS tablet TAKE 1 TABLET (20 MG TOTAL) BY MOUTH DAILY WITH SUPPER. (Patient taking  differently: Take 20 mg by mouth daily with supper. ) 90 tablet 1   No current facility-administered medications on file prior to visit.     Review of Systems:  As per HPI- otherwise negative.   Physical Examination: Vitals:   10/04/18 1330  BP: 128/78  Pulse: 77  Resp: 16  Temp: 97.6 F (36.4 C)  SpO2: 98%   Vitals:   10/04/18 1330  Weight: 269 lb (122 kg)  Height: 6\' 2"  (1.88 m)   Body mass index is 34.54 kg/m. Ideal Body Weight: Weight in (lb) to have BMI = 25: 194.3  GEN: WDWN, NAD, Non-toxic, A & O x 3, obese, looks well  HEENT: Atraumatic, Normocephalic. Neck supple. No masses, No LAD.  Bilateral TM wnl, oropharynx normal.  PEERL,EOMI.   Ears and Nose: No external deformity. CV: a fib with normal rate, No M/G/R. No JVD. No thrill. No extra heart sounds. PULM: CTA B, no wheezes, crackles, rhonchi. No retractions. No resp. distress. No accessory muscle use. ABD: S, NT, ND, +BS. No rebound. No HSM.  Large stable umbilical hernia  EXTR: No c/c/e NEURO Normal gait.  PSYCH: Normally interactive. Conversant. Not depressed or anxious appearing.  Calm demeanor.  Foot exam; cannot sense mono fil right great toe only   Assessment and Plan: Permanent atrial fibrillation  Controlled type 2 diabetes mellitus without complication, without long-term current use of insulin ([place]) - Plan: Comprehensive metabolic panel, Hemoglobin A1c, metFORMIN (GLUCOPHAGE) 500 MG tablet  Mixed hyperlipidemia - Plan: Lipid panel  Essential hypertension, benign - Plan: Comprehensive metabolic panel  Coronary artery disease involving native coronary artery of native heart without angina pectoris  Chronic idiopathic gout involving toe of right foot without tophus - Plan: allopurinol (ZYLOPRIM) 100 MG tablet  Essential hypertension - Plan: lisinopril (PRINIVIL,ZESTRIL) 40 MG tablet  Screening for prostate cancer - Plan: PSA, Medicare ( [place] only)  Umbilical hernia without  obstruction and without gangrene  Following up today Labs pending as above Pneumovax today Refilled medications Will contact cardiology to set up a recheck for him - sent message to Mr. Kathlen Mody Suggested he get shingrix at his drug store  He does not wish to do anything about  his umbilical hernia at this time, it is not symptomatic   Signed Lamar Blinks, MD  Received his labs Results for orders placed or performed in visit on 10/04/18  Comprehensive metabolic panel  Result Value Ref Range   Sodium 136 135 - 145 mEq/L   Potassium 5.3 (H) 3.5 - 5.1 mEq/L   Chloride 101 96 - 112 mEq/L   CO2 27 19 - 32 mEq/L   Glucose, Bld 114 (H) 70 - 99 mg/dL   BUN 23 6 - 23 mg/dL   Creatinine, Ser 1.05 0.40 - 1.50 mg/dL   Total Bilirubin 2.0 (H) 0.2 - 1.2 mg/dL   Alkaline Phosphatase 73 39 - 117 U/L   AST 22 0 - 37 U/L   ALT 17 0 - 53 U/L   Total Protein 7.2 6.0 - 8.3 g/dL   Albumin 4.5 3.5 - 5.2 g/dL   Calcium 9.7 8.4 - 10.5 mg/dL   GFR 74.85 >60.00 mL/min  Hemoglobin A1c  Result Value Ref Range   Hgb A1c MFr Bld 6.3 4.6 - 6.5 %  Lipid panel  Result Value Ref Range   Cholesterol 151 0 - 200 mg/dL   Triglycerides 131.0 0.0 - 149.0 mg/dL   HDL 45.90 >39.00 mg/dL   VLDL 26.2 0.0 - 40.0 mg/dL   LDL Cholesterol 79 0 - 99 mg/dL   Total CHOL/HDL Ratio 3    NonHDL 105.49   PSA, Medicare ( [place] [facility] only)  Result Value Ref Range   PSA 0.54 0.10 - 4.00 ng/ml   Message to pt  A1c is stable  Lab Results  Component Value Date   PSA 0.54 10/04/2018   PSA 0.33 11/27/2016  0.21 increase   Message sent to pt  A1c looks fine, your diabetes is under okay control. Cholesterol looks good. Your PSA is okay, it has gone up a bit since last year.  However as the absolute numbers still quite low, I would suggest we repeat this in 1 year. Your bilirubin, which is a marker left of liver metabolism, is a bit high.  Looking back this is not completely new, it has been slightly high in the past.   This is likely a benign finding, but we will want to follow it up. Also your potassium is slightly high.  Again, this is likely benign.  However we do want to monitor it, as very high potassium can be dangerous.  I am going to order a repeat potassium and bilirubin level for you.   Please come in for lab visit only in 2 to 3 weeks to have this drawn.  Please call and make a lab appointment prior to your visit.  This lab does not need to be fasting and can be done at your convenience.  Do be mindful of your dietary potassium intake, you can find a list of high potassium foods online.  Avoid excessive intake of these foods  Let me know if any questions and if you don't hear about a cardiology appointment soon

## 2018-10-04 ENCOUNTER — Encounter: Payer: Self-pay | Admitting: Family Medicine

## 2018-10-04 ENCOUNTER — Ambulatory Visit (INDEPENDENT_AMBULATORY_CARE_PROVIDER_SITE_OTHER): Payer: Medicare Other | Admitting: Family Medicine

## 2018-10-04 VITALS — BP 128/78 | HR 77 | Temp 97.6°F | Resp 16 | Ht 74.0 in | Wt 269.0 lb

## 2018-10-04 DIAGNOSIS — Z23 Encounter for immunization: Secondary | ICD-10-CM

## 2018-10-04 DIAGNOSIS — I2510 Atherosclerotic heart disease of native coronary artery without angina pectoris: Secondary | ICD-10-CM | POA: Diagnosis not present

## 2018-10-04 DIAGNOSIS — M1A0710 Idiopathic chronic gout, right ankle and foot, without tophus (tophi): Secondary | ICD-10-CM | POA: Diagnosis not present

## 2018-10-04 DIAGNOSIS — E119 Type 2 diabetes mellitus without complications: Secondary | ICD-10-CM | POA: Diagnosis not present

## 2018-10-04 DIAGNOSIS — E782 Mixed hyperlipidemia: Secondary | ICD-10-CM | POA: Diagnosis not present

## 2018-10-04 DIAGNOSIS — I4821 Permanent atrial fibrillation: Secondary | ICD-10-CM

## 2018-10-04 DIAGNOSIS — E875 Hyperkalemia: Secondary | ICD-10-CM

## 2018-10-04 DIAGNOSIS — Z125 Encounter for screening for malignant neoplasm of prostate: Secondary | ICD-10-CM | POA: Diagnosis not present

## 2018-10-04 DIAGNOSIS — K429 Umbilical hernia without obstruction or gangrene: Secondary | ICD-10-CM

## 2018-10-04 DIAGNOSIS — I10 Essential (primary) hypertension: Secondary | ICD-10-CM | POA: Diagnosis not present

## 2018-10-04 LAB — LIPID PANEL
Cholesterol: 151 mg/dL (ref 0–200)
HDL: 45.9 mg/dL (ref >39.00)
LDL Cholesterol: 79 mg/dL (ref 0–99)
NonHDL: 105.49
Total CHOL/HDL Ratio: 3
Triglycerides: 131 mg/dL (ref 0.0–149.0)
VLDL: 26.2 mg/dL (ref 0.0–40.0)

## 2018-10-04 LAB — COMPREHENSIVE METABOLIC PANEL
ALT: 17 U/L (ref 0–53)
AST: 22 U/L (ref 0–37)
Albumin: 4.5 g/dL (ref 3.5–5.2)
Alkaline Phosphatase: 73 U/L (ref 39–117)
BUN: 23 mg/dL (ref 6–23)
CO2: 27 mEq/L (ref 19–32)
Calcium: 9.7 mg/dL (ref 8.4–10.5)
Chloride: 101 mEq/L (ref 96–112)
Creatinine, Ser: 1.05 mg/dL (ref 0.40–1.50)
GFR: 74.85 mL/min (ref >60.00)
Glucose, Bld: 114 mg/dL — ABNORMAL HIGH (ref 70–99)
Potassium: 5.3 mEq/L — ABNORMAL HIGH (ref 3.5–5.1)
Sodium: 136 mEq/L (ref 135–145)
Total Bilirubin: 2 mg/dL — ABNORMAL HIGH (ref 0.2–1.2)
Total Protein: 7.2 g/dL (ref 6.0–8.3)

## 2018-10-04 LAB — PSA, MEDICARE: PSA: 0.54 ng/ml (ref 0.10–4.00)

## 2018-10-04 LAB — HEMOGLOBIN A1C: Hgb A1c MFr Bld: 6.3 % (ref 4.6–6.5)

## 2018-10-04 MED ORDER — METFORMIN HCL 500 MG PO TABS: 500.0000 mg | ORAL_TABLET | Freq: Every day | ORAL | 3 refills | Status: DC

## 2018-10-04 MED ORDER — ALLOPURINOL 100 MG PO TABS: 100.0000 mg | ORAL_TABLET | Freq: Every day | ORAL | 3 refills | Status: DC

## 2018-10-04 MED ORDER — LISINOPRIL 40 MG PO TABS: 40.0000 mg | ORAL_TABLET | Freq: Every day | ORAL | 3 refills | Status: DC

## 2018-10-04 NOTE — Patient Instructions (Signed)
Good to see you today- I'll be in touch with your labs You got the 2nd pneumonia vaccine today You might also want to get your shingles vaccine at the drug store   Health Maintenance, Male A healthy lifestyle and preventive care is important for your health and wellness. Ask your health care provider about what schedule of regular examinations is right for you. What should I know about weight and diet? Eat a Healthy Diet  Eat plenty of vegetables, fruits, whole grains, low-fat dairy products, and lean protein.  Do not eat a lot of foods high in solid fats, added sugars, or salt.  Maintain a Healthy Weight Regular exercise can help you achieve or maintain a healthy weight. You should:  Do at least 150 minutes of exercise each week. The exercise should increase your heart rate and make you sweat (moderate-intensity exercise).  Do strength-training exercises at least twice a week.  Watch Your Levels of Cholesterol and Blood Lipids  Have your blood tested for lipids and cholesterol every 5 years starting at 67 years of age. If you are at high risk for heart disease, you should start having your blood tested when you are 67 years old. You may need to have your cholesterol levels checked more often if: ? Your lipid or cholesterol levels are high. ? You are older than 67 years of age. ? You are at high risk for heart disease.  What should I know about cancer screening? Many types of cancers can be detected early and may often be prevented. Lung Cancer  You should be screened every year for lung cancer if: ? You are a current smoker who has smoked for at least 30 years. ? You are a former smoker who has quit within the past 15 years.  Talk to your health care provider about your screening options, when you should start screening, and how often you should be screened.  Colorectal Cancer  Routine colorectal cancer screening usually begins at 67 years of age and should be repeated every  5-10 years until you are 67 years old. You may need to be screened more often if early forms of precancerous polyps or small growths are found. Your health care provider may recommend screening at an earlier age if you have risk factors for colon cancer.  Your health care provider may recommend using home test kits to check for hidden blood in the stool.  A small camera at the end of a tube can be used to examine your colon (sigmoidoscopy or colonoscopy). This checks for the earliest forms of colorectal cancer.  Prostate and Testicular Cancer  Depending on your age and overall health, your health care provider may do certain tests to screen for prostate and testicular cancer.  Talk to your health care provider about any symptoms or concerns you have about testicular or prostate cancer.  Skin Cancer  Check your skin from head to toe regularly.  Tell your health care provider about any new moles or changes in moles, especially if: ? There is a change in a mole's size, shape, or color. ? You have a mole that is larger than a pencil eraser.  Always use sunscreen. Apply sunscreen liberally and repeat throughout the day.  Protect yourself by wearing long sleeves, pants, a wide-brimmed hat, and sunglasses when outside.  What should I know about heart disease, diabetes, and high blood pressure?  If you are 8-23 years of age, have your blood pressure checked every 3-5 years. If  you are 26 years of age or older, have your blood pressure checked every year. You should have your blood pressure measured twice-once when you are at a hospital or clinic, and once when you are not at a hospital or clinic. Record the average of the two measurements. To check your blood pressure when you are not at a hospital or clinic, you can use: ? An automated blood pressure machine at a pharmacy. ? A home blood pressure monitor.  Talk to your health care provider about your target blood pressure.  If you are  between 81-3 years old, ask your health care provider if you should take aspirin to prevent heart disease.  Have regular diabetes screenings by checking your fasting blood sugar level. ? If you are at a normal weight and have a low risk for diabetes, have this test once every three years after the age of 13. ? If you are overweight and have a high risk for diabetes, consider being tested at a younger age or more often.  A one-time screening for abdominal aortic aneurysm (AAA) by ultrasound is recommended for men aged 4-75 years who are current or former smokers. What should I know about preventing infection? Hepatitis B If you have a higher risk for hepatitis B, you should be screened for this virus. Talk with your health care provider to find out if you are at risk for hepatitis B infection. Hepatitis C Blood testing is recommended for:  Everyone born from [birth year] through 1965.  Anyone with known risk factors for hepatitis C.  Sexually Transmitted Diseases (STDs)  You should be screened each year for STDs including gonorrhea and chlamydia if: ? You are sexually active and are younger than 67 years of age. ? You are older than 67 years of age and your health care provider tells you that you are at risk for this type of infection. ? Your sexual activity has changed since you were last screened and you are at an increased risk for chlamydia or gonorrhea. Ask your health care provider if you are at risk.  Talk with your health care provider about whether you are at high risk of being infected with HIV. Your health care provider may recommend a prescription medicine to help prevent HIV infection.  What else can I do?  Schedule regular health, dental, and eye exams.  Stay current with your vaccines (immunizations).  Do not use any tobacco products, such as cigarettes, chewing tobacco, and e-cigarettes. If you need help quitting, ask your health care provider.  Limit alcohol intake to no  more than 2 drinks per day. One drink equals 12 ounces of beer, 5 ounces of wine, or 1 ounces of hard liquor.  Do not use street drugs.  Do not share needles.  Ask your health care provider for help if you need support or information about quitting drugs.  Tell your health care provider if you often feel depressed.  Tell your health care provider if you have ever been abused or do not feel safe at home. This information is not intended to replace advice given to you by your health care provider. Make sure you discuss any questions you have with your health care provider. Document Released: 04/03/2008 Document Revised: 06/04/2016 Document Reviewed: 07/10/2015 Elsevier Interactive Patient Education  [employer].

## 2018-10-04 NOTE — Addendum Note (Signed)
Addended by: Lamar Blinks C on: 10/04/2018 08:10 PM   Modules accepted: Orders

## 2018-10-06 ENCOUNTER — Ambulatory Visit (INDEPENDENT_AMBULATORY_CARE_PROVIDER_SITE_OTHER): Payer: Medicare Other | Admitting: Nurse Practitioner

## 2018-10-06 ENCOUNTER — Ambulatory Visit (INDEPENDENT_AMBULATORY_CARE_PROVIDER_SITE_OTHER): Payer: Medicare Other

## 2018-10-06 ENCOUNTER — Encounter: Payer: Self-pay | Admitting: Nurse Practitioner

## 2018-10-06 ENCOUNTER — Other Ambulatory Visit: Payer: Self-pay | Admitting: Nurse Practitioner

## 2018-10-06 VITALS — BP 190/100 | HR 121 | Ht 73.0 in | Wt 269.8 lb

## 2018-10-06 DIAGNOSIS — I2510 Atherosclerotic heart disease of native coronary artery without angina pectoris: Secondary | ICD-10-CM

## 2018-10-06 DIAGNOSIS — I4821 Permanent atrial fibrillation: Secondary | ICD-10-CM | POA: Diagnosis not present

## 2018-10-06 DIAGNOSIS — R6889 Other general symptoms and signs: Secondary | ICD-10-CM | POA: Diagnosis not present

## 2018-10-06 MED ORDER — DILTIAZEM HCL ER COATED BEADS 180 MG PO CP24: 180.0000 mg | ORAL_CAPSULE | Freq: Every day | ORAL | 3 refills | Status: DC

## 2018-10-06 MED ORDER — DILTIAZEM HCL 30 MG PO TABS: 30.0000 mg | ORAL_TABLET | Freq: Four times a day (QID) | ORAL | 6 refills | Status: DC

## 2018-10-06 NOTE — Progress Notes (Signed)
CARDIOLOGY OFFICE NOTE  Date:  10/06/2018    Arthur Rich Date of Birth: September 10, 1951 Medical Record #[id number]  PCP:  [name], MD  Cardiologist:  [name]  ([name] in 2016)  Chief Complaint  Patient presents with  . Follow-up    Post hospital visit - seen for Dr. [name]    History of Present Illness: Arthur Rich is a 66 y.o. male who presents today for a work in visit. Seen for Dr. [name].   He has a history of persistent AF, CAD with prior BMS to the LAD in 2009, DM, OSA, HLD, obesity, & prior atrial flutter. Stress test in 2018 was normal. EF is normal by prior echo in 2018. He has had prior hemoptysis. He had a holter back in 01/2017 showing continuous AF with good rate control - rare pauses (occurred at night while sleeping).   Looks like he last saw Dr. [name] in 2016.   He was last seen here in July by [name], NP - was not feeling well with HR 80 to 90.   Per Care Everywhere - he was recently admitted in December back in [place].  Had confusion/memory loss. MRI of the brain showed a tiny area of restricted diffusion in the right cerebral hemisphere - possible minimal ischemia. 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 Carotid ultrasound showed no significant bilateral stenosis. Echo with preserved EF. Remained in chronic AF. Rate was controlled.   Seen by PCP earlier this week- has been in the ER 2 weeks ago with hypertensive urgency - noted AF with RVR. Given IV labetalol. Noted occasional feeling of nausea and "not feeling well" in general - occurs randomly - sometimes happens with exercise. No actual chest pain - not able to determine the etiology of these spells.   Comes in today. Here alone. He notes "distress" at this time - he is glad that this is happening while here with me. He feels short of breath, nauseated and lightheaded. He says he "lives with this all  the time". He continues to drive. No passing out spells but tells me he has had prior syncope. He is frustrated that "no one can figure out what is wrong with me".  No actual chest pain. BP is quite elevated when he first arrived and his HR was quite high. He is pretty sedentary as a general rule. He says his HR has been in the 60's - but can go 80 to 150 with walking per his fitbit. He wonders how much anxiety is playing a role. Has only eaten a little today. No misses doses of Xarelto or aspirin. He wonders if his blood sugar is playing a role. He was not aware of recommendation to see neurology here in [place] due to the prior admission.   Past Medical History:  Diagnosis Date  . Atrial flutter ([place])   . Cancer ([place])    skin  . Coronary artery disease involving native coronary artery of native heart without angina pectoris 04/30/2010   LHC 9/09:  S/p BMS to pLAD // Myoview 4/18: No ischemia, low risk, not gated  . Hyperlipidemia   . Hypertension   . Obesity (BMI 30-39.9) 09/28/2015  . Overweight(278.02)   . Permanent atrial fibrillation 10/25/2014   Echo 10/14: EF 55-65%, mild MR, mod LAE, PASP 31 mmHg // Holter 9/16:  AFib, avg HR 70, rare HR up to  150, rare brady with 35 bpm and 3 sec pause during HS // Echo 4/18: EF 55-60, no RWMA, mild LAE, moderate RAE, trivial TR, PASP 28  . Syncope and collapse 03/29/2010  . TIA (transient ischemic attack)     Past Surgical History:  Procedure Laterality Date  . CARDIAC CATHETERIZATION  9/09   PCI LAD (BMS)  . nasal septal surgery    . TONSILLECTOMY    . WISDOM TOOTH EXTRACTION       Medications: Current Meds  Medication Sig  . acetaminophen (TYLENOL) 500 MG tablet Take 500 mg by mouth every 6 (six) hours as needed for headache (pain).  Marland Kitchen allopurinol (ZYLOPRIM) 100 MG tablet Take 1 tablet (100 mg total) by mouth daily.  Marland Kitchen aspirin EC 81 MG tablet Take 81 mg by mouth daily.  Marland Kitchen atorvastatin (LIPITOR) 80 MG tablet Take 1 tablet (80 mg total)  by mouth daily. (Patient taking differently: Take 80 mg by mouth every evening. )  . Cholecalciferol (VITAMIN D) 50 MCG (2000 UT) CAPS Take 2,000 Units by mouth daily.   Marland Kitchen lisinopril (PRINIVIL,ZESTRIL) 40 MG tablet Take 1 tablet (40 mg total) by mouth daily.  . metFORMIN (GLUCOPHAGE) 500 MG tablet Take 1 tablet (500 mg total) by mouth daily with supper.  . metoprolol succinate (TOPROL-XL) 100 MG 24 hr tablet Take 1 tablet (100 mg total) by mouth 2 (two) times daily.  [name] Glycol-Propyl Glycol (SYSTANE OP) Place 1 drop into both eyes daily as needed (dry eyes).   [name] 20 MG TABS tablet TAKE 1 TABLET (20 MG TOTAL) BY MOUTH DAILY WITH SUPPER. (Patient taking differently: Take 20 mg by mouth daily with supper. )     Allergies: Allergies  Allergen Reactions  . Penicillins Rash    Has patient had a PCN reaction causing immediate rash, facial/tongue/throat swelling, SOB or lightheadedness with hypotension: Yes Has patient had a PCN reaction causing severe rash involving mucus membranes or skin necrosis: No Has patient had a PCN reaction that required hospitalization: No Has patient had a PCN reaction occurring within the last 10 years: No If all of the above answers are "NO", then may proceed with Cephalosporin use.    Social History: The patient  reports that he quit smoking about 22 years ago. His smoking use included cigarettes. He has a 30.00 pack-year smoking history. He has never used smokeless tobacco. He reports current alcohol use. He reports that he does not use drugs.   Family History: The patient's family history includes Cancer in his maternal grandmother; Diabetes in his father; Heart attack in his maternal uncle; Heart disease in his father.   Review of Systems: Please see the history of present illness.   Otherwise, the review of systems is positive for none.   All other systems are reviewed and negative.   Physical Exam:  VS:  BP (!) 190/100 (BP Location: Left  Arm, Patient Position: Sitting, Cuff Size: Normal)   Pulse (!) 121   Ht 6\' 1"  (1.854 m)   Wt 269 lb 12.8 oz (122.4 kg)   BMI 35.60 kg/m  .  BMI Body mass index is 35.6 kg/m.  Wt Readings from Last 3 Encounters:  10/06/18 269 lb 12.8 oz (122.4 kg)  10/04/18 269 lb (122 kg)  09/25/18 270 lb (122.5 kg)   Repeat BP is 150/70 and then 160/80 - both in the right arm.  Towards the end of the visit BP is 110/78 in the left and 140/90 in  the right. Repeated by me is 120/60 in the left arm and 150/90 in the right.    General: Seems a little anxious to me initially.  He is alert and in no acute distress. He keeps rubbing his arms - denies any discomfort. Admits he is nervous.  HEENT: Normal.  Neck: Supple, no JVD, carotid bruits, or masses noted.  Cardiac: Irregular irregular and with elevated HR initially - this has gradually come down during the visit here today. No murmur noted. No edema.  Respiratory:  Lungs are clear to auscultation bilaterally with normal work of breathing.  GI: Soft and nontender. Hernia noted.  MS: No deformity or atrophy. Gait and ROM intact.  Skin: Warm and dry. Color is normal.  Neuro:  Strength and sensation are intact and no gross focal deficits noted.  Psych: Alert, appropriate and with normal affect.   LABORATORY DATA:  EKG:  EKG is ordered today. This demonstrates AF with RVR of 121.   Lab Results  Component Value Date   WBC 9.7 09/25/2018   HGB 15.4 09/25/2018   HCT 46.9 09/25/2018   PLT 226 09/25/2018   GLUCOSE 114 (H) 10/04/2018   CHOL 151 10/04/2018   TRIG 131.0 10/04/2018   HDL 45.90 10/04/2018   LDLDIRECT 107.0 11/27/2016   LDLCALC 79 10/04/2018   ALT 17 10/04/2018   AST 22 10/04/2018   NA 136 10/04/2018   K 5.3 (H) 10/04/2018   CL 101 10/04/2018   CREATININE 1.05 10/04/2018   BUN 23 10/04/2018   CO2 27 10/04/2018   TSH 1.052 Test methodology is 3rd generation TSH 05/23/2008   PSA 0.54 10/04/2018   INR 1.5 08/05/2013   HGBA1C 6.3  10/04/2018     BNP (last 3 results) No results for input(s): BNP in the last 8760 hours.  ProBNP (last 3 results) No results for input(s): PROBNP in the last 8760 hours.   Other Studies Reviewed Today:  ECHO 08/2018 from [facility] Result Narrative   Normal LV cavity size.  Normal ejection fraction, EF 55-60%.  Mild concentric hypertrophy observed in the left ventricle.  Unable to assess left ventricular diastolic function due to atrial  fibrillation.  An agitated saline study was performed. Bubble study was suboptimal but  appears to be negative for PFO/ASD.  Trace mitral valve regurgitation.  Trace tricuspid valve regurgitation.     Myoview Study Highlights 01/2017   There was no ST segment deviation noted during stress.  No T wave inversion was noted during stress.  The study is normal.  This is a low risk study.  No evidence of ischemia.  This study was not gated due to atrial fibrillation.      LHC 9/09 BMS to pLAD   ASSESSMENT AND PLAN:  1.  Episodes of nausea/heart rate lability and BP lability - unclear to me what this is - during the time of our visit, BP has come down along with HR down. BP is quite different between arms. Unclear to me if anxiety/blood sugar, etc is playing a role. Will proceed with our work up to entail event monitor, labs and update stress test. Would defer glucose tolerance testing and neuro referral to his PCP.   2. Known CAD - remote stent - low risk Myoview from 2018 - will get updated - would not want to proceed at this time with cath and need to hold anticoagulation given recent admission with ?TIA versus transient global amnesia   3. Marked difference in BP between arms -  sending for arterial duplex.   4. AF - unclear how well controlled - he has been basing info on his FitBit - will update his Event monitor - he tells me these "spells" have not been documented while on a monitor -may need to consider a loop. I  am going to add low dose CCB long acting and short acting. He is on max doses of beta blocker - ?too much.   5. ?recent TIA versus transient global amnesia. Needs neuro referral arranged - he is going to speak with PCP - he was thinking of going back to [place] next week - I would hold on that given that he is driving.   6. HTN - ?labile -has come down- BP different between arms - arranging for arterial duplex.   7. Obesity  Current medicines are reviewed with the patient today.  The patient does not have concerns regarding medicines other than what has been noted above.  The following changes have been made:  See above.  Labs/ tests ordered today include:    Orders Placed This Encounter  Procedures  . Basic metabolic panel  . CBC  . TSH  . CARDIAC EVENT MONITOR  . MYOCARDIAL PERFUSION IMAGING  . EKG 12-Lead     Disposition:   Will get his studies back - may need to see Dr. [name] again for EP issues. Further disposition to follow. Situation is tenuous to me. I am not sure what to make of his "spells" - I have told him he may need to seek second opinion at teaching institution.   Patient is agreeable to this plan and will call if any problems develop in the interim.   SignedTruitt Merle, NP  10/06/2018 2:44 PM  [facility] [address] [place], [place]  [address] Phone: [phone] Fax: [phone]

## 2018-10-06 NOTE — Addendum Note (Signed)
Addended by: Burtis Junes on: 10/06/2018 03:05 PM   Modules accepted: Orders

## 2018-10-06 NOTE — Patient Instructions (Addendum)
We will be checking the following labs today - BMET, CBC, HPF and TSH   Medication Instructions:    Continue with your current medicines. BUT  I am adding Diltiazem 180 mg to take every day  I am also adding Diltiazem 30 mg to take only if needed for HR over 100    If you need a refill on your cardiac medications before your next appointment, please call your pharmacy.     Testing/Procedures To Be Arranged:  Event monitor  [place] Myoview  Upper extremity arterial doppler study  Follow-Up:   See Dr. [name] once studies are complete    At [facility], you and your health needs are our priority.  As part of our continuing mission to provide you with exceptional heart care, we have created designated Provider Care Teams.  These Care Teams include your primary Cardiologist (physician) and Advanced Practice Providers (APPs -  Physician Assistants and Nurse Practitioners) who all work together to provide you with the care you need, when you need it.  Special Instructions:  . Reviewed information on Myoview Information Sheet (see scanned document for further details).  Spoke with .   Call the [place] office at [phone] if you have any questions, problems or concerns.

## 2018-10-07 ENCOUNTER — Ambulatory Visit (HOSPITAL_COMMUNITY)
Admission: RE | Admit: 2018-10-07 | Discharge: 2018-10-07 | Disposition: A | Discharge status: Home / Self Care | Payer: Medicare Other | Source: Ambulatory Visit | Attending: Cardiology | Admitting: Cardiology

## 2018-10-07 DIAGNOSIS — R6889 Other general symptoms and signs: Secondary | ICD-10-CM | POA: Diagnosis not present

## 2018-10-07 LAB — BASIC METABOLIC PANEL
BUN/Creatinine Ratio: 21 (ref 10–24)
BUN: 23 mg/dL (ref 8–27)
CO2: 21 mmol/L (ref 20–29)
Calcium: 9.8 mg/dL (ref 8.6–10.2)
Chloride: 99 mmol/L (ref 96–106)
Creatinine, Ser: 1.08 mg/dL (ref 0.76–1.27)
GFR calc Af Amer: 82 mL/min/{1.73_m2} (ref >59)
GFR calc non Af Amer: 71 mL/min/{1.73_m2} (ref >59)
Glucose: 101 mg/dL — ABNORMAL HIGH (ref 65–99)
Potassium: 5 mmol/L (ref 3.5–5.2)
Sodium: 136 mmol/L (ref 134–144)

## 2018-10-07 LAB — CBC
Hematocrit: 45.4 % (ref 37.5–51.0)
Hemoglobin: 15.4 g/dL (ref 13.0–17.7)
MCH: 30.8 pg (ref 26.6–33.0)
MCHC: 33.9 g/dL (ref 31.5–35.7)
MCV: 91 fL (ref 79–97)
Platelets: 222 10*3/uL (ref 150–450)
RBC: 5 x10E6/uL (ref 4.14–5.80)
RDW: 13.1 % (ref 12.3–15.4)
WBC: 8.1 10*3/uL (ref 3.4–10.8)

## 2018-10-07 LAB — TSH: TSH: 1.69 u[IU]/mL (ref 0.450–4.500)

## 2018-10-07 NOTE — Progress Notes (Signed)
Noted  

## 2018-10-08 ENCOUNTER — Telehealth: Payer: Self-pay | Admitting: Nurse Practitioner

## 2018-10-08 ENCOUNTER — Telehealth (HOSPITAL_COMMUNITY): Payer: Self-pay | Admitting: Radiology

## 2018-10-08 NOTE — Telephone Encounter (Signed)
Per pt was sleeping at that time Pt aware of recommendations and agrees with plan./cy

## 2018-10-08 NOTE — Telephone Encounter (Signed)
Per Corene Cornea Pt had 3.6 second pause at 12:43 am with a fib.Awaiting for faxed tracing

## 2018-10-08 NOTE — Telephone Encounter (Signed)
Lm for pt to call back ./cy 

## 2018-10-08 NOTE — Telephone Encounter (Signed)
Patient given detailed instructions per Myocardial Perfusion Study Information Sheet for the test on 10/18/2018 at 8:15. Patient notified to arrive 15 minutes early and that it is imperative to arrive on time for appointment to keep from having the test rescheduled.  If you need to cancel or reschedule your appointment, please call the office within 24 hours of your appointment. . Patient verbalized understanding.EHK

## 2018-10-08 NOTE — Telephone Encounter (Signed)
° °  Arthur Rich from [employer] to report abnormal EKG

## 2018-10-08 NOTE — Telephone Encounter (Signed)
Discussed with Dr Meda Coffee have pt continue current meds if asymptomatic and keep f/u with Dr [name] . If pt symptomatic review with Dr Meda Coffee (DOD)./cy

## 2018-10-10 ENCOUNTER — Telehealth: Payer: Self-pay | Admitting: Cardiology

## 2018-10-10 NOTE — Telephone Encounter (Signed)
Called by monitoring company that pt had 3.76 second pause and HR for 6 sec of 26 BPM.  Otherwise HR in 40-50s.  Will send to L. Servando Snare, NP and Dr. [name], recordings were sent to the office.

## 2018-10-11 ENCOUNTER — Telehealth (HOSPITAL_COMMUNITY): Payer: Self-pay | Admitting: *Deleted

## 2018-10-11 ENCOUNTER — Encounter: Payer: Self-pay | Admitting: Nurse Practitioner

## 2018-10-11 NOTE — Telephone Encounter (Signed)
Erroneous encounter

## 2018-10-11 NOTE — Telephone Encounter (Signed)
Left message on voicemail per DPR in reference to upcoming appointment scheduled on 10/18/18 at 0815 with detailed instructions given per Myocardial Perfusion Study Information Sheet for the test. LM to arrive 15 minutes early, and that it is imperative to arrive on time for appointment to keep from having the test rescheduled. If you need to cancel or reschedule your appointment, please call the office within 24 hours of your appointment. Failure to do so may result in a cancellation of your appointment, and a $50 no show fee. Phone number given for call back for any questions. [place], Ranae Palms

## 2018-10-11 NOTE — Telephone Encounter (Signed)
Noted.  Also discussed with Dr. [name] - will continue to monitor.  These spells were all nocturnal.

## 2018-10-11 NOTE — Telephone Encounter (Addendum)
Spoke with the patient, he was asleep during his pauses on 12/21 and 12/22. Received monitors and gave to Truitt Merle to review. He stated in the past he had a monitor that showed these similar results.

## 2018-10-11 NOTE — Telephone Encounter (Signed)
New Message   Christa with [employer] is calling to give an abnormal EKG

## 2018-10-12 ENCOUNTER — Telehealth: Payer: Self-pay | Admitting: Medical

## 2018-10-12 NOTE — Telephone Encounter (Signed)
Notified by [place] heart monitoring company 10/12/18 that patient had another 3.2 second pause at 2:14am 10/11/18 with continued persistent atrial fibrillation following pause. These episodes have been noted throughout monitoring period per review of previous telephone encounters. Will continue to monitor patient until completion of 30-day monitoring period.   Abigail Butts, PA-C 10/12/18

## 2018-10-13 ENCOUNTER — Telehealth: Payer: Self-pay | Admitting: Cardiology

## 2018-10-13 NOTE — Telephone Encounter (Signed)
Called by monitoring service.  Another 3.1 sec pause.  Attempted to reach pt but no answer.  Left message if any issues to call, lightheaded or dizzy.  Will send to Dr. [name] and Cecille Rubin.

## 2018-10-14 ENCOUNTER — Telehealth: Payer: Self-pay | Admitting: Internal Medicine

## 2018-10-14 ENCOUNTER — Encounter (HOSPITAL_COMMUNITY): Payer: Medicare Other

## 2018-10-14 NOTE — Telephone Encounter (Signed)
New message      Josh from [facility] calling to report an abnormal EKG.

## 2018-10-14 NOTE — Telephone Encounter (Signed)
Last 3 monitor strips reviewed 10/06/18 at 3:17 am - At Fib with 3 second pause 10/11/18 at 2:14 am - At Fib with 3.2 second pause 10/13/18 at 5:42 am - At Fib with 3.1 second pause. Attempted to contact pt by phone to verify he was sleeping - NA.  Will try again. [phone]

## 2018-10-14 NOTE — Telephone Encounter (Signed)
Spoke with another representative from [employer] who was calling to report at 4:51AM pt's HR was 28- Afib with a 3.1 second pause.  Spoke with pt and he states he was asleep during both of these episodes.  Advised I will speak to Dr. Caryl Comes, DOD and call back with recommendations.   Spoke with DOD and he said no changes since pt is asymptomatic and keep appt with Dr. [name] at the end of January.

## 2018-10-14 NOTE — Telephone Encounter (Signed)
Can we get the strips - see if this is all still at night while sleeping?

## 2018-10-14 NOTE — Telephone Encounter (Signed)
Received call from [facility] reporting pt had a 3.1 second pause at 12:44 am At Fib/flutter and asymptomatic.

## 2018-10-15 ENCOUNTER — Telehealth: Payer: Self-pay | Admitting: *Deleted

## 2018-10-15 NOTE — Telephone Encounter (Signed)
8:15 am:  received [employer] call reporting monitor showing 3.0 second pause, AFib, 50bpm at 0334 this morning.  Called pt who reports sleeping at the time with no symptoms. Discussed w/ DOD, Dr. Johnsie Cancel, order to change monitor parameters to call for pauses greater than 4 sec.   [employer] will fax paper for MD signature for new parameter.

## 2018-10-15 NOTE — Telephone Encounter (Signed)
[employer] form completed, stating do not contact pt/office unless pause is greater than 4 seconds. Completed form faxed back to [place].  Confirmation received.

## 2018-10-18 ENCOUNTER — Ambulatory Visit (HOSPITAL_COMMUNITY): Payer: Medicare Other | Attending: Cardiovascular Disease

## 2018-10-18 DIAGNOSIS — I2510 Atherosclerotic heart disease of native coronary artery without angina pectoris: Secondary | ICD-10-CM | POA: Diagnosis not present

## 2018-10-18 DIAGNOSIS — I4821 Permanent atrial fibrillation: Secondary | ICD-10-CM

## 2018-10-18 LAB — MYOCARDIAL PERFUSION IMAGING
LV dias vol: 91 mL (ref 62–150)
LV sys vol: 30 mL
Peak HR: 103 {beats}/min
Rest HR: 75 {beats}/min
SDS: 0
SRS: 0
SSS: 0
TID: 0.94

## 2018-10-18 MED ORDER — TECHNETIUM TC 99M TETROFOSMIN IV KIT
32.7000 | PACK | Freq: Once | INTRAVENOUS | Status: AC | PRN
  Administered 2018-10-18: 32.7 via INTRAVENOUS
  Filled 2018-10-18: qty 33

## 2018-10-18 MED ORDER — TECHNETIUM TC 99M TETROFOSMIN IV KIT
10.9000 | PACK | Freq: Once | INTRAVENOUS | Status: AC | PRN
  Administered 2018-10-18: 10.9 via INTRAVENOUS
  Filled 2018-10-18: qty 11

## 2018-10-18 MED ORDER — REGADENOSON 0.4 MG/5ML IV SOLN
0.4000 mg | Freq: Once | INTRAVENOUS | Status: AC
  Administered 2018-10-18: 0.4 mg via INTRAVENOUS

## 2018-10-21 ENCOUNTER — Telehealth: Payer: Self-pay | Admitting: Interventional Cardiology

## 2018-10-21 NOTE — Telephone Encounter (Signed)
Call received from [place] that pt had an auto trigger event at 1:04A with pt in Afib with a 3.1 second pause.  Per previous note, new parameters were put into place not to contact office unless pause is greater than 2 seconds.  Will continue to monitor.

## 2018-10-28 DIAGNOSIS — E119 Type 2 diabetes mellitus without complications: Secondary | ICD-10-CM | POA: Diagnosis not present

## 2018-10-28 LAB — HM DIABETES EYE EXAM

## 2018-11-05 DIAGNOSIS — I4819 Other persistent atrial fibrillation: Secondary | ICD-10-CM | POA: Diagnosis not present

## 2018-11-13 ENCOUNTER — Other Ambulatory Visit: Payer: Self-pay | Admitting: Interventional Cardiology

## 2018-11-15 NOTE — Telephone Encounter (Signed)
Pt last saw Truitt Merle, NP on 10/06/18, last labs 10/06/18 Creat 1.08, age 68, weight 122.4kg, CrCl 114.91, based on CrCl pt is on appropriate dosage of Xarelto 20mg  QD.  Will refill rx.

## 2018-11-17 ENCOUNTER — Encounter: Payer: Self-pay | Admitting: Internal Medicine

## 2018-11-17 ENCOUNTER — Ambulatory Visit (INDEPENDENT_AMBULATORY_CARE_PROVIDER_SITE_OTHER): Payer: Medicare Other | Admitting: Internal Medicine

## 2018-11-17 VITALS — BP 142/86 | HR 76 | Ht 74.0 in | Wt 275.0 lb

## 2018-11-17 DIAGNOSIS — I10 Essential (primary) hypertension: Secondary | ICD-10-CM | POA: Diagnosis not present

## 2018-11-17 DIAGNOSIS — I2510 Atherosclerotic heart disease of native coronary artery without angina pectoris: Secondary | ICD-10-CM

## 2018-11-17 DIAGNOSIS — G4730 Sleep apnea, unspecified: Secondary | ICD-10-CM | POA: Diagnosis not present

## 2018-11-17 DIAGNOSIS — I4821 Permanent atrial fibrillation: Secondary | ICD-10-CM | POA: Diagnosis not present

## 2018-11-17 NOTE — Progress Notes (Signed)
Electrophysiology Office Note   Date:  11/17/2018   ID:  Arthur Rich, DOB November 06, 1950, MRN [id number]  PCP:  [name], MD  Cardiologist:  Dr [name] Primary Electrophysiologist: Thompson Grayer, MD    CC; afib   History of Present Illness: Arthur Rich is a 68 y.o. male who presents today for electrophysiology evaluation.   He presents today on referral by Dr [name] and Truitt Merle NP for AF management.  I have seen him previously (more than 4 years ago).  He has permanent afib.  He has done well with rate control for quite some time.   He reports that about a month ago while in [place] visiting his mother, he has an amnestic period.  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  He has had spells of  "sinking feeling" with nausea lasting several hours at a time.  Denies presyncope or syncope.  He was noted to have elevated BP as well as RVR.  He was placed on diltiazem and has done well since.  He reports "now I feel as well as I have felt in years". He has OSA but has not used CPAP in several years.  He is not followed regularly for this.  He was not happy with his prior experience and would prefer to see another provider.  Today, he denies symptoms of palpitations, chest pain, shortness of breath, orthopnea, PND, lower extremity edema, claudication, dizziness, presyncope, syncope, bleeding, or neurologic sequela. The patient is tolerating medications without difficulties and is otherwise without complaint today.    Past Medical History:  Diagnosis Date  . Atrial flutter ([place])   . Cancer ([place])    skin  . Coronary artery disease involving native coronary artery of native heart without angina pectoris 04/30/2010   LHC 9/09:  S/p BMS to pLAD // Myoview 4/18: No ischemia, low risk, not gated  . Hyperlipidemia   . Hypertension   . Obesity (BMI 30-39.9) 09/28/2015  . Overweight(278.02)   . Permanent atrial fibrillation 10/25/2014   Echo 10/14: EF 55-65%,  mild MR, mod LAE, PASP 31 mmHg // Holter 9/16:  AFib, avg HR 70, rare HR up to 150, rare brady with 35 bpm and 3 sec pause during HS // Echo 4/18: EF 55-60, no RWMA, mild LAE, moderate RAE, trivial TR, PASP 28  . Syncope and collapse 03/29/2010  . TIA (transient ischemic attack)    Past Surgical History:  Procedure Laterality Date  . CARDIAC CATHETERIZATION  9/09   PCI LAD (BMS)  . nasal septal surgery    . TONSILLECTOMY    . WISDOM TOOTH EXTRACTION       Current Outpatient Medications  Medication Sig Dispense Refill  . acetaminophen (TYLENOL) 500 MG tablet Take 500 mg by mouth every 6 (six) hours as needed for headache (pain).    Marland Kitchen allopurinol (ZYLOPRIM) 100 MG tablet Take 1 tablet (100 mg total) by mouth daily. 90 tablet 3  . atorvastatin (LIPITOR) 80 MG tablet Take 1 tablet (80 mg total) by mouth daily. (Patient taking differently: Take 80 mg by mouth every evening. ) 90 tablet 3  . Cholecalciferol (VITAMIN D) 50 MCG (2000 UT) CAPS Take 2,000 Units by mouth daily.     Marland Kitchen diltiazem (CARDIZEM CD) 180 MG 24 hr capsule Take 1 capsule (180 mg total) by mouth daily. 90 capsule 3  . diltiazem (CARDIZEM) 30 MG tablet Take 1 tablet (30 mg total) by mouth 4 (four) times daily. [place]  tablet 6  . lisinopril (PRINIVIL,ZESTRIL) 40 MG tablet Take 1 tablet (40 mg total) by mouth daily. 90 tablet 3  . metFORMIN (GLUCOPHAGE) 500 MG tablet Take 1 tablet (500 mg total) by mouth daily with supper. 90 tablet 3  . metoprolol succinate (TOPROL-XL) 100 MG 24 hr tablet Take 1 tablet (100 mg total) by mouth 2 (two) times daily. 180 tablet 3  . Polyethyl Glycol-Propyl Glycol (SYSTANE OP) Place 1 drop into both eyes daily as needed (dry eyes).     [name] 20 MG TABS tablet TAKE 1 TABLET (20 MG TOTAL) BY MOUTH DAILY WITH SUPPER. 90 tablet 2   No current facility-administered medications for this visit.     Allergies:   Penicillins   Social History:  The patient  reports that he quit smoking about 22 years ago.  His smoking use included cigarettes. He has a 30.00 pack-year smoking history. He has never used smokeless tobacco. He reports current alcohol use. He reports that he does not use drugs.   Family History:  The patient's  family history includes Cancer in his maternal grandmother; Diabetes in his father; Heart attack in his maternal uncle; Heart disease in his father.    ROS:  Please see the history of present illness.   All other systems are personally reviewed and negative.    PHYSICAL EXAM: VS:  BP (!) 142/86   Pulse 76   Ht 6\' 2"  (1.88 m)   Wt 275 lb (124.7 kg)   SpO2 96%   BMI 35.31 kg/m  , BMI Body mass index is 35.31 kg/m. GEN: Well nourished, well developed, in no acute distress  HEENT: normal  Neck: no JVD, carotid bruits, or masses Cardiac: iRRR; no murmurs, rubs, or gallops,no edema  Respiratory:  clear to auscultation bilaterally, normal work of breathing GI: soft, nontender, nondistended, + BS MS: no deformity or atrophy  Skin: warm and dry  Neuro:  Strength and sensation are intact Psych: euthymic mood, full affect  EKG:  EKG is ordered today. The ekg ordered today is personally reviewed and shows afib, V rate 76 bpm   Recent Labs: 09/25/2018: Magnesium 1.9 10/04/2018: ALT 17 10/06/2018: BUN 23; Creatinine, Ser 1.08; Hemoglobin 15.4; Platelets 222; Potassium 5.0; Sodium 136; TSH 1.690  personally reviewed   Lipid Panel     Component Value Date/Time   CHOL 151 10/04/2018 1417   CHOL 162 03/30/2017 0845   TRIG 131.0 10/04/2018 1417   HDL 45.90 10/04/2018 1417   HDL 38 (L) 03/30/2017 0845   CHOLHDL 3 10/04/2018 1417   VLDL 26.2 10/04/2018 1417   LDLCALC 79 10/04/2018 1417   LDLCALC 72 03/30/2017 0845   LDLDIRECT 107.0 11/27/2016 1733   personally reviewed   Wt Readings from Last 3 Encounters:  11/17/18 275 lb (124.7 kg)  10/18/18 269 lb (122 kg)  10/06/18 269 lb 12.8 oz (122.4 kg)      Other studies personally reviewed: Additional studies/  records that were reviewed today include: recent monitor is personally reviewed , echo 2016, Cecille Rubin Gerhardt's notes Review of the above records today demonstrates: as above   ASSESSMENT AND PLAN:  1.  Permanent afib Rate controlled.  Recent monitor reveals average V rates 60s Feels well currently On anticoagulation No changes today  2. Nocturnal bradycardia Currently asymptomatic May need to consider pacing if he has daytime pauses or symptoms He would prefer to avoid this currently  3. OSA Not compliant with CPAP Refer to Carlena Sax MD per  his request  4. HTN Stable No change required today  5. CAD No ischemic symptoms  Follow-up:  AF clinic in 6 weeks and then with Dr [name] thereafter I am happy to see when needed  Current medicines are reviewed at length with the patient today.   The patient does not have concerns regarding his medicines.  The following changes were made today:  none    Signed, Thompson Grayer, MD  11/17/2018 10:26 AM     [facility] [address] [place] [address] [phone] (office) [phone] (fax)

## 2018-11-17 NOTE — Patient Instructions (Addendum)
Medication Instructions:  Your physician recommends that you continue on your current medications as directed. Please refer to the Current Medication list given to you today.  Labwork: None ordered.  Testing/Procedures: None ordered.  Follow-Up: Your physician wants you to follow-up in: 6 weeks with AFIB clinic.     Any Other Special Instructions Will Be Listed Below (If Applicable).  Referral made to Dr. Maxwell Caul for sleep apnea  If you need a refill on your cardiac medications before your next appointment, please call your pharmacy.

## 2018-12-28 ENCOUNTER — Encounter (HOSPITAL_COMMUNITY): Payer: Self-pay | Admitting: Physician Assistant

## 2018-12-28 ENCOUNTER — Ambulatory Visit (HOSPITAL_COMMUNITY)
Admission: RE | Admit: 2018-12-28 | Discharge: 2018-12-28 | Disposition: A | Discharge status: Home / Self Care | Payer: Medicare Other | Source: Ambulatory Visit | Attending: Physician Assistant | Admitting: Physician Assistant

## 2018-12-28 VITALS — BP 126/84 | HR 76 | Temp 97.8°F | Ht 74.0 in | Wt 275.0 lb

## 2018-12-28 DIAGNOSIS — Z809 Family history of malignant neoplasm, unspecified: Secondary | ICD-10-CM | POA: Insufficient documentation

## 2018-12-28 DIAGNOSIS — R9431 Abnormal electrocardiogram [ECG] [EKG]: Secondary | ICD-10-CM | POA: Diagnosis not present

## 2018-12-28 DIAGNOSIS — I10 Essential (primary) hypertension: Secondary | ICD-10-CM | POA: Insufficient documentation

## 2018-12-28 DIAGNOSIS — Z87891 Personal history of nicotine dependence: Secondary | ICD-10-CM | POA: Insufficient documentation

## 2018-12-28 DIAGNOSIS — E785 Hyperlipidemia, unspecified: Secondary | ICD-10-CM | POA: Diagnosis not present

## 2018-12-28 DIAGNOSIS — Z7901 Long term (current) use of anticoagulants: Secondary | ICD-10-CM | POA: Insufficient documentation

## 2018-12-28 DIAGNOSIS — I4821 Permanent atrial fibrillation: Secondary | ICD-10-CM | POA: Insufficient documentation

## 2018-12-28 DIAGNOSIS — G4733 Obstructive sleep apnea (adult) (pediatric): Secondary | ICD-10-CM | POA: Diagnosis not present

## 2018-12-28 DIAGNOSIS — Z880 Allergy status to penicillin: Secondary | ICD-10-CM | POA: Insufficient documentation

## 2018-12-28 DIAGNOSIS — Z85828 Personal history of other malignant neoplasm of skin: Secondary | ICD-10-CM | POA: Diagnosis not present

## 2018-12-28 DIAGNOSIS — I4892 Unspecified atrial flutter: Secondary | ICD-10-CM | POA: Insufficient documentation

## 2018-12-28 DIAGNOSIS — E669 Obesity, unspecified: Secondary | ICD-10-CM | POA: Diagnosis not present

## 2018-12-28 DIAGNOSIS — Z6835 Body mass index (BMI) 35.0-35.9, adult: Secondary | ICD-10-CM | POA: Diagnosis not present

## 2018-12-28 DIAGNOSIS — Z7984 Long term (current) use of oral hypoglycemic drugs: Secondary | ICD-10-CM | POA: Insufficient documentation

## 2018-12-28 DIAGNOSIS — I2510 Atherosclerotic heart disease of native coronary artery without angina pectoris: Secondary | ICD-10-CM | POA: Diagnosis not present

## 2018-12-28 DIAGNOSIS — Z79899 Other long term (current) drug therapy: Secondary | ICD-10-CM | POA: Insufficient documentation

## 2018-12-28 DIAGNOSIS — Z833 Family history of diabetes mellitus: Secondary | ICD-10-CM | POA: Insufficient documentation

## 2018-12-28 DIAGNOSIS — Z8249 Family history of ischemic heart disease and other diseases of the circulatory system: Secondary | ICD-10-CM | POA: Insufficient documentation

## 2018-12-28 NOTE — Progress Notes (Signed)
Primary Care Physician: [name], MD Primary Cardiologist: Dr [name] Primary Electrophysiologist: Dr [name] Referring Physician: Dr Lazaro Arms is a 68 y.o. male with a history of permanent atrial fibrillation, OSA, HTN, CAD who presents for follow up in the [facility].  The patient reports that he has done very well since starting on diltiazem with increased exercise tolerance. He has an appointment with sleep medicine to restart his CPAP. He is happy with the present therapy.  Today, he denies symptoms of palpitations, chest pain, shortness of breath, orthopnea, PND, lower extremity edema, dizziness, presyncope, syncope, bleeding, or neurologic sequela. The patient is tolerating medications without difficulties and is otherwise without complaint today.    Atrial Fibrillation Risk Factors:  he does have symptoms or diagnosis of sleep apnea. he is not compliant with CPAP therapy. he does not have a history of rheumatic fever. he does not have a history of alcohol use. The patient does not have a history of early familial atrial fibrillation or other arrhythmias.  he has a BMI of Body mass index is 35.31 kg/m.Marland Kitchen Filed Weights   12/28/18 1105  Weight: 124.7 kg    Family History  Problem Relation Age of Onset  . Diabetes Father   . Heart disease Father   . Heart attack Maternal Uncle        early 7's  . Cancer Maternal Grandmother        unknown ?leukemia     Atrial Fibrillation Management history:  CHADS2VASC score: 6 (age, HTN, CAD, TIA, DM) Anticoagulation history: Xarelto   Past Medical History:  Diagnosis Date  . Atrial flutter ([place])   . Cancer ([place])    skin  . Coronary artery disease involving native coronary artery of native heart without angina pectoris 04/30/2010   LHC 9/09:  S/p BMS to pLAD // Myoview 4/18: No ischemia, low risk, not gated  . Hyperlipidemia   . Hypertension   . Obesity (BMI 30-39.9)  09/28/2015  . Overweight(278.02)   . Permanent atrial fibrillation 10/25/2014   Echo 10/14: EF 55-65%, mild MR, mod LAE, PASP 31 mmHg // Holter 9/16:  AFib, avg HR 70, rare HR up to 150, rare brady with 35 bpm and 3 sec pause during HS // Echo 4/18: EF 55-60, no RWMA, mild LAE, moderate RAE, trivial TR, PASP 28  . Syncope and collapse 03/29/2010  . TIA (transient ischemic attack)    Past Surgical History:  Procedure Laterality Date  . CARDIAC CATHETERIZATION  9/09   PCI LAD (BMS)  . nasal septal surgery    . TONSILLECTOMY    . WISDOM TOOTH EXTRACTION      Current Outpatient Medications  Medication Sig Dispense Refill  . acetaminophen (TYLENOL) 500 MG tablet Take 500 mg by mouth every 6 (six) hours as needed for headache (pain).    Marland Kitchen allopurinol (ZYLOPRIM) 100 MG tablet Take 1 tablet (100 mg total) by mouth daily. 90 tablet 3  . atorvastatin (LIPITOR) 80 MG tablet Take 1 tablet (80 mg total) by mouth daily. (Patient taking differently: Take 80 mg by mouth every evening. ) 90 tablet 3  . Cholecalciferol (VITAMIN D) 50 MCG (2000 UT) CAPS Take 2,000 Units by mouth daily.     Marland Kitchen diltiazem (CARDIZEM CD) 180 MG 24 hr capsule Take 1 capsule (180 mg total) by mouth daily. 90 capsule 3  . lisinopril (PRINIVIL,ZESTRIL) 40 MG tablet Take 1 tablet (40 mg total) by mouth  daily. 90 tablet 3  . metFORMIN (GLUCOPHAGE) 500 MG tablet Take 1 tablet (500 mg total) by mouth daily with supper. 90 tablet 3  . metoprolol succinate (TOPROL-XL) 100 MG 24 hr tablet Take by mouth as directed. Take 100mg   in the morning and 50mg  at night    . Polyethyl Glycol-Propyl Glycol (SYSTANE OP) Place 1 drop into both eyes daily as needed (dry eyes).     [name] 20 MG TABS tablet TAKE 1 TABLET (20 MG TOTAL) BY MOUTH DAILY WITH SUPPER. 90 tablet 2  . diltiazem (CARDIZEM) 30 MG tablet Take 1 tablet (30 mg total) by mouth 4 (four) times daily. (Patient not taking: Reported on 12/28/2018) 30 tablet 6   No current  facility-administered medications for this encounter.     Allergies  Allergen Reactions  . Penicillins Rash    Has patient had a PCN reaction causing immediate rash, facial/tongue/throat swelling, SOB or lightheadedness with hypotension: Yes Has patient had a PCN reaction causing severe rash involving mucus membranes or skin necrosis: No Has patient had a PCN reaction that required hospitalization: No Has patient had a PCN reaction occurring within the last 10 years: No If all of the above answers are "NO", then may proceed with Cephalosporin use.    Social History   Socioeconomic History  . Marital status: Single    Spouse name: Not on file  . Number of children: Not on file  . Years of education: Not on file  . Highest education level: Not on file  Occupational History  . Not on file  Social Needs  . Financial resource strain: Not on file  . Food insecurity:    Worry: Not on file    Inability: Not on file  . Transportation needs:    Medical: Not on file    Non-medical: Not on file  Tobacco Use  . Smoking status: Former Smoker    Packs/day: 1.00    Years: 30.00    Pack years: 30.00    Types: Cigarettes    Last attempt to quit: 08/26/1996    Years since quitting: 22.3  . Smokeless tobacco: Never Used  Substance and Sexual Activity  . Alcohol use: Yes    Comment: previously heavy, now trying to cut back  . Drug use: No  . Sexual activity: Not on file  Lifestyle  . Physical activity:    Days per week: Not on file    Minutes per session: Not on file  . Stress: Not on file  Relationships  . Social connections:    Talks on phone: Not on file    Gets together: Not on file    Attends religious service: Not on file    Active member of club or organization: Not on file    Attends meetings of clubs or organizations: Not on file    Relationship status: Not on file  . Intimate partner violence:    Fear of current or ex partner: Not on file    Emotionally abused: Not on  file    Physically abused: Not on file    Forced sexual activity: Not on file  Other Topics Concern  . Not on file  Social History Narrative  . Not on file     ROS- All systems are reviewed and negative except as per the HPI above.  Physical Exam: Vitals:   12/28/18 1105  BP: 126/84  Pulse: 76  Temp: 97.8 F (36.6 C)  TempSrc: Oral  Weight: 124.7  kg  Height: 6\' 2"  (1.88 m)    GEN- The patient is well appearing obese male, alert and oriented x 3 today.   Head- normocephalic, atraumatic Eyes-  Sclera clear, conjunctiva pink Ears- hearing intact Oropharynx- clear Neck- supple  Lungs- Clear to ausculation bilaterally, normal work of breathing Heart- irregular rate and rhythm, no murmurs, rubs or gallops  GI- soft, NT, ND, + BS Extremities- no clubbing, cyanosis, or edema MS- no significant deformity or atrophy Skin- no rash or lesion Psych- euthymic mood, full affect Neuro- strength and sensation are intact  Wt Readings from Last 3 Encounters:  12/28/18 124.7 kg  11/17/18 124.7 kg  10/18/18 122 kg    EKG today demonstrates atrial fibrillation HR 76, QRS 84, QTc 438  Echo 01/27/17 demonstrated  - Left ventricle: The cavity size was normal. Wall thickness was   normal. Systolic function was normal. The estimated ejection   fraction was in the range of 55% to 60%. Wall motion was normal;   there were no regional wall motion abnormalities. The study is   not technically sufficient to allow evaluation of LV diastolic   function. - Left atrium: The atrium was mildly dilated. - Right atrium: Moderately dilated. - Tricuspid valve: There was trivial regurgitation. - Pulmonary arteries: PA peak pressure: 28 mm Hg (S). - Inferior vena cava: The vessel was normal in size. The   respirophasic diameter changes were in the normal range (= 50%),   consistent with normal central venous pressure.  Epic records are reviewed at length today  Assessment and Plan:  1. Permanent  atrial fibrillation The patient has permanent atrial fibrillation.   He reports that he feels much better with increase in diltiazem. Continue diltiazem 180 mg daily and 30 mg PRN dose. Continue Xarelto 20 mg daily Continue Toprol 100 mg AM 50 mg PM.  This patients CHA2DS2-VASc Score and unadjusted Ischemic Stroke Rate (% per year) is equal to 9.7 % stroke rate/year from a score of 6  Above score calculated as 1 point each if present [CHF, HTN, DM, Vascular=MI/PAD/Aortic Plaque, Age if 65-74, or Male] Above score calculated as 2 points each if present [Age > 75, or Stroke/TIA/TE]   2. Obesity Body mass index is 35.31 kg/m. Lifestyle modification was discussed at length including regular exercise and weight reduction.  3. Obstructive sleep apnea The importance of adequate treatment of sleep apnea was discussed today in order to improve our ability to maintain sinus rhythm long term. Patient has not been compliant with CPAP in the past but has a scheduled appointment with Dr Ruby Cola upcoming.  4. HTN Stable, no changes today.  5. CAD No anginal symptoms. Continue present therapy and risk factor modification.   Follow up with Dr [name] as scheduled. Follow up in Afib clinic 9-10 months.  [facility] [address] [place], [place] [address] [phone] 12/28/2018 11:41 AM

## 2019-04-25 ENCOUNTER — Other Ambulatory Visit: Payer: Self-pay | Admitting: Cardiology

## 2019-04-26 NOTE — Telephone Encounter (Signed)
Ok

## 2019-06-14 DIAGNOSIS — G4733 Obstructive sleep apnea (adult) (pediatric): Secondary | ICD-10-CM | POA: Diagnosis not present

## 2019-07-14 DIAGNOSIS — Z23 Encounter for immunization: Secondary | ICD-10-CM | POA: Diagnosis not present

## 2019-08-10 ENCOUNTER — Other Ambulatory Visit: Payer: Self-pay | Admitting: Interventional Cardiology

## 2019-08-10 NOTE — Telephone Encounter (Signed)
Pt last saw Dr [name] 11/17/18, last labs 10/06/18 Creat 1.08, age 68, weight 124.7kg, CrCl 117.07, based on CrCl pt is on appropriate dosage of Xarelto 20mg  QD.  Will refill rx.

## 2019-09-14 ENCOUNTER — Other Ambulatory Visit: Payer: Self-pay

## 2019-09-26 ENCOUNTER — Ambulatory Visit (HOSPITAL_COMMUNITY)
Admission: RE | Admit: 2019-09-26 | Discharge: 2019-09-26 | Disposition: A | Discharge status: Home / Self Care | Payer: Medicare Other | Source: Ambulatory Visit | Attending: Physician Assistant | Admitting: Physician Assistant

## 2019-09-26 ENCOUNTER — Other Ambulatory Visit (HOSPITAL_COMMUNITY): Payer: Self-pay | Admitting: *Deleted

## 2019-09-26 ENCOUNTER — Other Ambulatory Visit: Payer: Self-pay

## 2019-09-26 ENCOUNTER — Encounter (HOSPITAL_COMMUNITY): Payer: Self-pay | Admitting: Physician Assistant

## 2019-09-26 VITALS — BP 162/84 | HR 90 | Ht 74.0 in | Wt 279.8 lb

## 2019-09-26 DIAGNOSIS — Z6835 Body mass index (BMI) 35.0-35.9, adult: Secondary | ICD-10-CM | POA: Insufficient documentation

## 2019-09-26 DIAGNOSIS — I2510 Atherosclerotic heart disease of native coronary artery without angina pectoris: Secondary | ICD-10-CM | POA: Diagnosis not present

## 2019-09-26 DIAGNOSIS — Z7901 Long term (current) use of anticoagulants: Secondary | ICD-10-CM | POA: Diagnosis not present

## 2019-09-26 DIAGNOSIS — I10 Essential (primary) hypertension: Secondary | ICD-10-CM | POA: Diagnosis not present

## 2019-09-26 DIAGNOSIS — G4733 Obstructive sleep apnea (adult) (pediatric): Secondary | ICD-10-CM | POA: Insufficient documentation

## 2019-09-26 DIAGNOSIS — Z87891 Personal history of nicotine dependence: Secondary | ICD-10-CM | POA: Insufficient documentation

## 2019-09-26 DIAGNOSIS — Z833 Family history of diabetes mellitus: Secondary | ICD-10-CM | POA: Insufficient documentation

## 2019-09-26 DIAGNOSIS — Z8673 Personal history of transient ischemic attack (TIA), and cerebral infarction without residual deficits: Secondary | ICD-10-CM | POA: Diagnosis not present

## 2019-09-26 DIAGNOSIS — Z8249 Family history of ischemic heart disease and other diseases of the circulatory system: Secondary | ICD-10-CM | POA: Diagnosis not present

## 2019-09-26 DIAGNOSIS — I4821 Permanent atrial fibrillation: Secondary | ICD-10-CM | POA: Diagnosis not present

## 2019-09-26 DIAGNOSIS — Z85828 Personal history of other malignant neoplasm of skin: Secondary | ICD-10-CM | POA: Diagnosis not present

## 2019-09-26 DIAGNOSIS — E669 Obesity, unspecified: Secondary | ICD-10-CM | POA: Insufficient documentation

## 2019-09-26 DIAGNOSIS — Z809 Family history of malignant neoplasm, unspecified: Secondary | ICD-10-CM | POA: Insufficient documentation

## 2019-09-26 DIAGNOSIS — Z79899 Other long term (current) drug therapy: Secondary | ICD-10-CM | POA: Diagnosis not present

## 2019-09-26 DIAGNOSIS — Z7984 Long term (current) use of oral hypoglycemic drugs: Secondary | ICD-10-CM | POA: Insufficient documentation

## 2019-09-26 DIAGNOSIS — I4892 Unspecified atrial flutter: Secondary | ICD-10-CM | POA: Insufficient documentation

## 2019-09-26 DIAGNOSIS — E785 Hyperlipidemia, unspecified: Secondary | ICD-10-CM | POA: Insufficient documentation

## 2019-09-26 MED ORDER — DILTIAZEM HCL ER COATED BEADS 180 MG PO CP24: 180.0000 mg | ORAL_CAPSULE | Freq: Every day | ORAL | 3 refills | Status: DC

## 2019-09-26 MED ORDER — RIVAROXABAN 20 MG PO TABS: ORAL_TABLET | ORAL | 1 refills | Status: DC

## 2019-09-26 NOTE — Progress Notes (Addendum)
Primary Care Physician: [name], MD Primary Cardiologist: Dr [name] Primary Electrophysiologist: Dr [name] Referring Physician: Dr Lazaro Arms is a 68 y.o. male with a history of permanent atrial fibrillation, OSA, HTN, CAD who presents for follow up in the [facility].  The patient reports that he has done well since his last visit. He has not had any heart racing or palpitations. He is trying to increase his activity by walking 30 minutes daily. He is tolerating the medicine without difficulty. He is on Xarelto for a CHADS2VASC score of 6.  Today, he denies symptoms of palpitations, chest pain, shortness of breath, orthopnea, PND, lower extremity edema, dizziness, presyncope, syncope, bleeding, or neurologic sequela. The patient is tolerating medications without difficulties and is otherwise without complaint today.    Atrial Fibrillation Risk Factors:  he does have symptoms or diagnosis of sleep apnea. he is not compliant with CPAP therapy. Followed by Dr Isidoro Donning. he does not have a history of rheumatic fever. he does not have a history of alcohol use. The patient does not have a history of early familial atrial fibrillation or other arrhythmias.  he has a BMI of Body mass index is 35.92 kg/m.Marland Kitchen Filed Weights   09/26/19 1341  Weight: 126.9 kg    Family History  Problem Relation Age of Onset  . Diabetes Father   . Heart disease Father   . Heart attack Maternal Uncle        early 75's  . Cancer Maternal Grandmother        unknown ?leukemia     Atrial Fibrillation Management history:  CHADS2VASC score: 6 (age, HTN, CAD, TIA, DM) Anticoagulation history: Xarelto   Past Medical History:  Diagnosis Date  . Atrial flutter ([place])   . Cancer ([place])    skin  . Coronary artery disease involving native coronary artery of native heart without angina pectoris 04/30/2010   LHC 9/09:  S/p BMS to pLAD // Myoview 4/18: No  ischemia, low risk, not gated  . Hyperlipidemia   . Hypertension   . Obesity (BMI 30-39.9) 09/28/2015  . Overweight(278.02)   . Permanent atrial fibrillation ([place]) 10/25/2014   Echo 10/14: EF 55-65%, mild MR, mod LAE, PASP 31 mmHg // Holter 9/16:  AFib, avg HR 70, rare HR up to 150, rare brady with 35 bpm and 3 sec pause during HS // Echo 4/18: EF 55-60, no RWMA, mild LAE, moderate RAE, trivial TR, PASP 28  . Syncope and collapse 03/29/2010  . TIA (transient ischemic attack)    Past Surgical History:  Procedure Laterality Date  . CARDIAC CATHETERIZATION  9/09   PCI LAD (BMS)  . nasal septal surgery    . TONSILLECTOMY    . WISDOM TOOTH EXTRACTION      Current Outpatient Medications  Medication Sig Dispense Refill  . acetaminophen (TYLENOL) 500 MG tablet Take 500 mg by mouth every 6 (six) hours as needed for headache (pain).    Marland Kitchen allopurinol (ZYLOPRIM) 100 MG tablet Take 1 tablet (100 mg total) by mouth daily. 90 tablet 3  . atorvastatin (LIPITOR) 80 MG tablet TAKE 1 TABLET BY MOUTH EVERY DAY 90 tablet 3  . Cholecalciferol (VITAMIN D) 50 MCG (2000 UT) CAPS Take 2,000 Units by mouth daily.     Marland Kitchen diltiazem (CARDIZEM CD) 180 MG 24 hr capsule Take 1 capsule (180 mg total) by mouth daily. 90 capsule 3  . diltiazem (CARDIZEM) 30 MG tablet Take  1 tablet (30 mg total) by mouth 4 (four) times daily. 30 tablet 6  . lisinopril (PRINIVIL,ZESTRIL) 40 MG tablet Take 1 tablet (40 mg total) by mouth daily. 90 tablet 3  . metFORMIN (GLUCOPHAGE) 500 MG tablet Take 1 tablet (500 mg total) by mouth daily with supper. 90 tablet 3  . metoprolol succinate (TOPROL-XL) 100 MG 24 hr tablet Take 1 tablet in the AM and 1/2 tablet in the PM 180 tablet 3  . Polyethyl Glycol-Propyl Glycol (SYSTANE OP) Place 1 drop into both eyes daily as needed (dry eyes).     [name] 20 MG TABS tablet TAKE 1 TABLET (20 MG TOTAL) BY MOUTH DAILY WITH SUPPER. 90 tablet 1   No current facility-administered medications for this  encounter.     Allergies  Allergen Reactions  . Penicillins Rash    Has patient had a PCN reaction causing immediate rash, facial/tongue/throat swelling, SOB or lightheadedness with hypotension: Yes Has patient had a PCN reaction causing severe rash involving mucus membranes or skin necrosis: No Has patient had a PCN reaction that required hospitalization: No Has patient had a PCN reaction occurring within the last 10 years: No If all of the above answers are "NO", then may proceed with Cephalosporin use.    Social History   Socioeconomic History  . Marital status: Single    Spouse name: Not on file  . Number of children: Not on file  . Years of education: Not on file  . Highest education level: Not on file  Occupational History  . Not on file  Social Needs  . Financial resource strain: Not on file  . Food insecurity    Worry: Not on file    Inability: Not on file  . Transportation needs    Medical: Not on file    Non-medical: Not on file  Tobacco Use  . Smoking status: Former Smoker    Packs/day: 1.00    Years: 30.00    Pack years: 30.00    Types: Cigarettes    Quit date: 08/26/1996    Years since quitting: 23.0  . Smokeless tobacco: Never Used  Substance and Sexual Activity  . Alcohol use: Yes    Alcohol/week: 2.0 standard drinks    Types: 2 Cans of beer per week    Comment: previously heavy, now trying to cut back  . Drug use: No  . Sexual activity: Not on file  Lifestyle  . Physical activity    Days per week: Not on file    Minutes per session: Not on file  . Stress: Not on file  Relationships  . Social Herbalist on phone: Not on file    Gets together: Not on file    Attends religious service: Not on file    Active member of club or organization: Not on file    Attends meetings of clubs or organizations: Not on file    Relationship status: Not on file  . Intimate partner violence    Fear of current or ex partner: Not on file    Emotionally  abused: Not on file    Physically abused: Not on file    Forced sexual activity: Not on file  Other Topics Concern  . Not on file  Social History Narrative  . Not on file     ROS- All systems are reviewed and negative except as per the HPI above.  Physical Exam: Vitals:   09/26/19 1341  BP: (!) 162/84  Pulse: 90  Weight: 126.9 kg  Height: 6\' 2"  (1.88 m)    GEN- The patient is well appearing obese male, alert and oriented x 3 today.   HEENT-head normocephalic, atraumatic, sclera clear, conjunctiva pink, hearing intact, trachea midline. Lungs- Clear to ausculation bilaterally, normal work of breathing Heart- irregular rate and rhythm, no murmurs, rubs or gallops  GI- soft, NT, ND, + BS Extremities- no clubbing, cyanosis, or edema MS- no significant deformity or atrophy Skin- no rash or lesion Psych- euthymic mood, full affect Neuro- strength and sensation are intact   Wt Readings from Last 3 Encounters:  09/26/19 126.9 kg  12/28/18 124.7 kg  11/17/18 124.7 kg    EKG today demonstrates afib HR 90, QRS 86, QTc 437  Echo 01/27/17 demonstrated  - Left ventricle: The cavity size was normal. Wall thickness was   normal. Systolic function was normal. The estimated ejection   fraction was in the range of 55% to 60%. Wall motion was normal;   there were no regional wall motion abnormalities. The study is   not technically sufficient to allow evaluation of LV diastolic   function. - Left atrium: The atrium was mildly dilated. - Right atrium: Moderately dilated. - Tricuspid valve: There was trivial regurgitation. - Pulmonary arteries: PA peak pressure: 28 mm Hg (S). - Inferior vena cava: The vessel was normal in size. The   respirophasic diameter changes were in the normal range (= 50%),   consistent with normal central venous pressure.  Epic records are reviewed at length today  Assessment and Plan:  1. Permanent atrial fibrillation Patient continues in permanent afib.  Asymptomatic.  Continue diltiazem 180 mg daily and 30 mg PRN dose for heart racing. Continue Xarelto 20 mg daily Continue Toprol 100 mg AM 50 mg PM.  This patients CHA2DS2-VASc Score and unadjusted Ischemic Stroke Rate (% per year) is equal to 9.7 % stroke rate/year from a score of 6  Above score calculated as 1 point each if present [CHF, HTN, DM, Vascular=MI/PAD/Aortic Plaque, Age if 65-74, or Male] Above score calculated as 2 points each if present [Age > 75, or Stroke/TIA/TE]   2. Obesity Body mass index is 35.92 kg/m. Lifestyle modification was discussed and encouraged including regular physical activity and weight reduction. Patient trying to walk 30 minutes daily.  3. Obstructive sleep apnea The importance of adequate treatment of sleep apnea was discussed today in order to improve our ability to maintain sinus rhythm long term. Followed by Dr Isidoro Donning. Intolerant of CPAP.  4. HTN Elevated today, has been well controlled in the past and and home. No changes today.   5. CAD No anginal symptoms. Followed by Dr [name].   Follow up with Dr [name] in 6 months.   [facility] [address] [place], [place] [address] [phone] 09/26/2019 2:07 PM

## 2019-10-21 ENCOUNTER — Other Ambulatory Visit: Payer: Self-pay | Admitting: Family Medicine

## 2019-10-21 DIAGNOSIS — E119 Type 2 diabetes mellitus without complications: Secondary | ICD-10-CM

## 2019-10-21 DIAGNOSIS — M1A0710 Idiopathic chronic gout, right ankle and foot, without tophus (tophi): Secondary | ICD-10-CM

## 2019-11-16 ENCOUNTER — Other Ambulatory Visit: Payer: Self-pay | Admitting: Family Medicine

## 2019-11-16 DIAGNOSIS — E119 Type 2 diabetes mellitus without complications: Secondary | ICD-10-CM

## 2019-11-16 DIAGNOSIS — M1A0710 Idiopathic chronic gout, right ankle and foot, without tophus (tophi): Secondary | ICD-10-CM

## 2019-11-18 ENCOUNTER — Ambulatory Visit: Payer: Medicare Other

## 2019-11-26 ENCOUNTER — Ambulatory Visit: Payer: Medicare Other

## 2019-12-01 ENCOUNTER — Other Ambulatory Visit: Payer: Self-pay

## 2019-12-01 DIAGNOSIS — I10 Essential (primary) hypertension: Secondary | ICD-10-CM

## 2019-12-01 MED ORDER — LISINOPRIL 40 MG PO TABS: 40.0000 mg | ORAL_TABLET | Freq: Every day | ORAL | 0 refills | Status: DC

## 2019-12-05 ENCOUNTER — Ambulatory Visit: Payer: Medicare Other

## 2019-12-13 ENCOUNTER — Other Ambulatory Visit: Payer: Self-pay | Admitting: Family Medicine

## 2019-12-13 DIAGNOSIS — M1A0710 Idiopathic chronic gout, right ankle and foot, without tophus (tophi): Secondary | ICD-10-CM

## 2019-12-13 DIAGNOSIS — E119 Type 2 diabetes mellitus without complications: Secondary | ICD-10-CM

## 2019-12-24 ENCOUNTER — Other Ambulatory Visit: Payer: Self-pay | Admitting: Family Medicine

## 2019-12-24 DIAGNOSIS — I10 Essential (primary) hypertension: Secondary | ICD-10-CM

## 2020-01-03 ENCOUNTER — Telehealth: Payer: Self-pay | Admitting: Family Medicine

## 2020-01-05 ENCOUNTER — Other Ambulatory Visit: Payer: Self-pay | Admitting: Family Medicine

## 2020-01-05 DIAGNOSIS — E119 Type 2 diabetes mellitus without complications: Secondary | ICD-10-CM

## 2020-01-05 DIAGNOSIS — M1A0710 Idiopathic chronic gout, right ankle and foot, without tophus (tophi): Secondary | ICD-10-CM

## 2020-01-06 NOTE — Progress Notes (Signed)
Nurse connected with patient 01/09/20 at  2:00 PM EDT by a telephone enabled telemedicine application and verified that I am speaking with the correct person using two identifiers. Patient stated full name and DOB. Patient gave permission to continue with virtual visit. Patient's location was at home and Nurse's location was at [place] office.   Subjective:   Arthur Rich is a 69 y.o. male who presents for Medicare Annual/Subsequent preventive examination.  Review of Systems: Home Safety/Smoke Alarms: Feels safe in home. Smoke alarms in place.  Lives alone in 1 story ome. Verbalizes good friend circle for support.   Male:   CCS- pt states he will discuss w/ PCP at appt on Wed.     PSA-  Lab Results  Component Value Date   PSA 0.54 10/04/2018   PSA 0.33 11/27/2016       Objective:    Vitals: BP 126/87 Comment: pt reported  There is no height or weight on file to calculate BMI.  Advanced Directives 01/09/2020 09/25/2018 04/09/2018 07/01/2015 04/05/2015  Does Patient Have a Medical Advance Directive? Yes No No No No  Type of Paramedic of [place];Living will - - - -  Does patient want to make changes to medical advance directive? No - Patient declined - - - -  Copy of [place] in Chart? No - copy requested - - - -  Would patient like information on creating a medical advance directive? - No - Patient declined - No - patient declined information No - patient declined information    Tobacco Social History   Tobacco Use  Smoking Status Former Smoker  . Packs/day: 1.00  . Years: 30.00  . Pack years: 30.00  . Types: Cigarettes  . Quit date: 08/26/1996  . Years since quitting: 23.3  Smokeless Tobacco Never Used     Counseling given: Not Answered   Clinical Intake:  Pain : No/denies pain     Past Medical History:  Diagnosis Date  . Atrial flutter ([place])   . Cancer ([place])    skin  . Coronary artery disease involving native  coronary artery of native heart without angina pectoris 04/30/2010   LHC 9/09:  S/p BMS to pLAD // Myoview 4/18: No ischemia, low risk, not gated  . Hyperlipidemia   . Hypertension   . Obesity (BMI 30-39.9) 09/28/2015  . Overweight(278.02)   . Permanent atrial fibrillation ([place]) 10/25/2014   Echo 10/14: EF 55-65%, mild MR, mod LAE, PASP 31 mmHg // Holter 9/16:  AFib, avg HR 70, rare HR up to 150, rare brady with 35 bpm and 3 sec pause during HS // Echo 4/18: EF 55-60, no RWMA, mild LAE, moderate RAE, trivial TR, PASP 28  . Syncope and collapse 03/29/2010  . TIA (transient ischemic attack)    Past Surgical History:  Procedure Laterality Date  . CARDIAC CATHETERIZATION  9/09   PCI LAD (BMS)  . nasal septal surgery    . TONSILLECTOMY    . WISDOM TOOTH EXTRACTION     Family History  Problem Relation Age of Onset  . Diabetes Father   . Heart disease Father   . Heart attack Maternal Uncle        early 62's  . Cancer Maternal Grandmother        unknown ?leukemia   Social History   Socioeconomic History  . Marital status: Single    Spouse name: Not on file  . Number of children: Not on file  .  Years of education: Not on file  . Highest education level: Not on file  Occupational History  . Not on file  Tobacco Use  . Smoking status: Former Smoker    Packs/day: 1.00    Years: 30.00    Pack years: 30.00    Types: Cigarettes    Quit date: 08/26/1996    Years since quitting: 23.3  . Smokeless tobacco: Never Used  Substance and Sexual Activity  . Alcohol use: Yes    Alcohol/week: 2.0 standard drinks    Types: 2 Cans of beer per week    Comment: previously heavy, now trying to cut back  . Drug use: No  . Sexual activity: Not on file  Other Topics Concern  . Not on file  Social History Narrative  . Not on file   Social Determinants of Health   Financial Resource Strain: Low Risk   . Difficulty of Paying Living Expenses: Not hard at all  Food Insecurity: No Food Insecurity    . Worried About Charity fundraiser in the Last Year: Never true  . Ran Out of Food in the Last Year: Never true  Transportation Needs: No Transportation Needs  . Lack of Transportation (Medical): No  . Lack of Transportation (Non-Medical): No  Physical Activity:   . Days of Exercise per Week:   . Minutes of Exercise per Session:   Stress:   . Feeling of Stress :   Social Connections:   . Frequency of Communication with Friends and Family:   . Frequency of Social Gatherings with Friends and Family:   . Attends Religious Services:   . Active Member of Clubs or Organizations:   . Attends Archivist Meetings:   Marland Kitchen Marital Status:     Outpatient Encounter Medications as of 01/09/2020  Medication Sig  . acetaminophen (TYLENOL) 500 MG tablet Take 500 mg by mouth every 6 (six) hours as needed for headache (pain).  Marland Kitchen allopurinol (ZYLOPRIM) 100 MG tablet TAKE 1 TABLET BY MOUTH EVERY DAY  . atorvastatin (LIPITOR) 80 MG tablet TAKE 1 TABLET BY MOUTH EVERY DAY  . Cholecalciferol (VITAMIN D) 50 MCG (2000 UT) CAPS Take 2,000 Units by mouth daily.   Marland Kitchen diltiazem (CARDIZEM) 30 MG tablet Take 1 tablet (30 mg total) by mouth 4 (four) times daily.  Marland Kitchen lisinopril (ZESTRIL) 40 MG tablet Take 1 tablet (40 mg total) by mouth daily. NEEDS APPOINTMENT  . metFORMIN (GLUCOPHAGE) 500 MG tablet TAKE 1 TABLET (500 MG TOTAL) BY MOUTH DAILY WITH SUPPER.  . metoprolol succinate (TOPROL-XL) 100 MG 24 hr tablet Take 1 tablet in the AM and 1/2 tablet in the PM  . Polyethyl Glycol-Propyl Glycol (SYSTANE OP) Place 1 drop into both eyes daily as needed (dry eyes).   . rivaroxaban (XARELTO) 20 MG TABS tablet TAKE 1 TABLET (20 MG TOTAL) BY MOUTH DAILY WITH SUPPER.  Marland Kitchen diltiazem (CARDIZEM CD) 180 MG 24 hr capsule Take 1 capsule (180 mg total) by mouth daily.   No facility-administered encounter medications on file as of 01/09/2020.    Activities of Daily Living In your present state of health, do you have any  difficulty performing the following activities: 01/09/2020  Hearing? N  Vision? N  Difficulty concentrating or making decisions? N  Walking or climbing stairs? N  Dressing or bathing? N  Doing errands, shopping? N  Preparing Food and eating ? N  Using the Toilet? N  In the past six months, have you accidently leaked urine?  N  Do you have problems with loss of bowel control? N  Managing your Medications? N  Managing your Finances? N  Housekeeping or managing your Housekeeping? N  Some recent data might be hidden    Patient Care Team: Copland, Gay Filler, MD as PCP - General (Family Medicine) Thompson Grayer, MD as Attending Physician (Cardiology)   Assessment:   This is a routine wellness examination for [place]. Physical assessment deferred to PCP.  Exercise Activities and Dietary recommendations Current Exercise Habits: Home exercise routine, Type of exercise: walking, Time (Minutes): 30, Frequency (Times/Week): 5, Weekly Exercise (Minutes/Week): 150, Intensity: Mild, Exercise limited by: None identified Diet (meal preparation, eat out, water intake, caffeinated beverages, dairy products, fruits and vegetables): in general, a "healthy" diet  , well balanced   Goals    . Maintain healthy lifestyle.       Fall Risk Fall Risk  01/09/2020 09/14/2019 07/30/2017  Falls in the past year? 0 0 No  Comment - Emmi Telephone Survey: data to providers prior to load -  Number falls in past yr: 0 - -  Injury with Fall? 0 - -  Follow up Education provided;Falls prevention discussed - -   Depression Screen PHQ 2/9 Scores 01/09/2020 07/30/2017  PHQ - 2 Score 0 0    Cognitive Function Ad8 score reviewed for issues:  Issues making decisions:no  Less interest in hobbies / activities:no  Repeats questions, stories (family complaining):no  Trouble using ordinary gadgets (microwave, computer, phone):no  Forgets the month or year: no  Mismanaging finances: no  Remembering  appts:no  Daily problems with thinking and/or memory:no Ad8 score is=0         Immunization History  Administered Date(s) Administered  . Influenza Split 09/19/2016  . Influenza, High Dose Seasonal PF 07/30/2017  . Influenza,inj,Quad PF,6+ Mos 07/12/2018  . Pneumococcal Conjugate-13 11/27/2016  . Pneumococcal Polysaccharide-23 10/04/2018  . Tdap 11/27/2016   Screening Tests Health Maintenance  Topic Date Due  . COLONOSCOPY  10/20/2018  . HEMOGLOBIN A1C  04/05/2019  . FOOT EXAM  10/05/2019  . OPHTHALMOLOGY EXAM  10/29/2019  . TETANUS/TDAP  11/27/2026  . INFLUENZA VACCINE  Completed  . Hepatitis C Screening  Completed  . PNA vac Low Risk Adult  Completed       Plan:    Please schedule your next medicare wellness visit with me in 1 yr.  Continue to eat heart healthy diet (full of fruits, vegetables, whole grains, lean protein, water--limit salt, fat, and sugar intake) and increase physical activity as tolerated.  Continue doing brain stimulating activities (puzzles, reading, adult coloring books, staying active) to keep memory sharp.   Bring a copy of your living will and/or healthcare power of attorney to your next office visit.  Discuss colonoscopy with PCP at your visit on Wednesday.    I have personally reviewed and noted the following in the patient's chart:   . Medical and social history . Use of alcohol, tobacco or illicit drugs  . Current medications and supplements . Functional ability and status . Nutritional status . Physical activity . Advanced directives . List of other physicians . Hospitalizations, surgeries, and ER visits in previous 12 months . Vitals . Screenings to include cognitive, depression, and falls . Referrals and appointments  In addition, I have reviewed and discussed with patient certain preventive protocols, quality metrics, and best practice recommendations. A written personalized care plan for preventive services as well as  general preventive health recommendations were provided to patient.  Naaman Plummer [place], [place]  01/09/2020

## 2020-01-09 ENCOUNTER — Ambulatory Visit (INDEPENDENT_AMBULATORY_CARE_PROVIDER_SITE_OTHER): Payer: Medicare Other | Admitting: *Deleted

## 2020-01-09 ENCOUNTER — Encounter: Payer: Self-pay | Admitting: *Deleted

## 2020-01-09 ENCOUNTER — Other Ambulatory Visit: Payer: Self-pay

## 2020-01-09 VITALS — BP 126/87

## 2020-01-09 DIAGNOSIS — Z0000 Encounter for general adult medical examination without abnormal findings: Secondary | ICD-10-CM | POA: Diagnosis not present

## 2020-01-09 NOTE — Patient Instructions (Signed)
Please schedule your next medicare wellness visit with me in 1 yr.  Continue to eat heart healthy diet (full of fruits, vegetables, whole grains, lean protein, water--limit salt, fat, and sugar intake) and increase physical activity as tolerated.  Continue doing brain stimulating activities (puzzles, reading, adult coloring books, staying active) to keep memory sharp.   Bring a copy of your living will and/or healthcare power of attorney to your next office visit.  Discuss colonoscopy with PCP at your visit on Wednesday.    Arthur Rich , Thank you for taking time to come for your Medicare Wellness Visit. I appreciate your ongoing commitment to your health goals. Please review the following plan we discussed and let me know if I can assist you in the future.   These are the goals we discussed: Goals    . Maintain healthy lifestyle.       This is a list of the screening recommended for you and due dates:  Health Maintenance  Topic Date Due  . Colon Cancer Screening  10/20/2018  . Hemoglobin A1C  04/05/2019  . Complete foot exam   10/05/2019  . Eye exam for diabetics  10/29/2019  . Tetanus Vaccine  11/27/2026  . Flu Shot  Completed  .  Hepatitis C: One time screening is recommended by Center for Disease Control  (CDC) for  adults born from [birth year] through 1965.   Completed  . Pneumonia vaccines  Completed    Preventive Care 60 Years and Older, Male Preventive care refers to lifestyle choices and visits with your health care provider that can promote health and wellness. This includes:  A yearly physical exam. This is also called an annual well check.  Regular dental and eye exams.  Immunizations.  Screening for certain conditions.  Healthy lifestyle choices, such as diet and exercise. What can I expect for my preventive care visit? Physical exam Your health care provider will check:  Height and weight. These may be used to calculate body mass index (BMI), which is a  measurement that tells if you are at a healthy weight.  Heart rate and blood pressure.  Your skin for abnormal spots. Counseling Your health care provider may ask you questions about:  Alcohol, tobacco, and drug use.  Emotional well-being.  Home and relationship well-being.  Sexual activity.  Eating habits.  History of falls.  Memory and ability to understand (cognition).  Work and work Statistician. What immunizations do I need?  Influenza (flu) vaccine  This is recommended every year. Tetanus, diphtheria, and pertussis (Tdap) vaccine  You may need a Td booster every 10 years. Varicella (chickenpox) vaccine  You may need this vaccine if you have not already been vaccinated. Zoster (shingles) vaccine  You may need this after age 105. Pneumococcal conjugate (PCV13) vaccine  One dose is recommended after age 42. Pneumococcal polysaccharide (PPSV23) vaccine  One dose is recommended after age 61. Measles, mumps, and rubella (MMR) vaccine  You may need at least one dose of MMR if you were born in 1957 or later. You may also need a second dose. Meningococcal conjugate (MenACWY) vaccine  You may need this if you have certain conditions. Hepatitis A vaccine  You may need this if you have certain conditions or if you travel or work in places where you may be exposed to hepatitis A. Hepatitis B vaccine  You may need this if you have certain conditions or if you travel or work in places where you may be exposed to  hepatitis B. Haemophilus influenzae type b (Hib) vaccine  You may need this if you have certain conditions. You may receive vaccines as individual doses or as more than one vaccine together in one shot (combination vaccines). Talk with your health care provider about the risks and benefits of combination vaccines. What tests do I need? Blood tests  Lipid and cholesterol levels. These may be checked every 5 years, or more frequently depending on your overall  health.  Hepatitis C test.  Hepatitis B test. Screening  Lung cancer screening. You may have this screening every year starting at age 67 if you have a 30-pack-year history of smoking and currently smoke or have quit within the past 15 years.  Colorectal cancer screening. All adults should have this screening starting at age 65 and continuing until age 48. Your health care provider may recommend screening at age 51 if you are at increased risk. You will have tests every 1-10 years, depending on your results and the type of screening test.  Prostate cancer screening. Recommendations will vary depending on your family history and other risks.  Diabetes screening. This is done by checking your blood sugar (glucose) after you have not eaten for a while (fasting). You may have this done every 1-3 years.  Abdominal aortic aneurysm (AAA) screening. You may need this if you are a current or former smoker.  Sexually transmitted disease (STD) testing. Follow these instructions at home: Eating and drinking  Eat a diet that includes fresh fruits and vegetables, whole grains, lean protein, and low-fat dairy products. Limit your intake of foods with high amounts of sugar, saturated fats, and salt.  Take vitamin and mineral supplements as recommended by your health care provider.  Do not drink alcohol if your health care provider tells you not to drink.  If you drink alcohol: ? Limit how much you have to 0-2 drinks a day. ? Be aware of how much alcohol is in your drink. In the [place] one drink equals one 12 oz bottle of beer (355 mL), one 5 oz glass of wine (148 mL), or one 1 oz glass of hard liquor (44 mL). Lifestyle  Take daily care of your teeth and gums.  Stay active. Exercise for at least 30 minutes on 5 or more days each week.  Do not use any products that contain nicotine or tobacco, such as cigarettes, e-cigarettes, and chewing tobacco. If you need help quitting, ask your health care  provider.  If you are sexually active, practice safe sex. Use a condom or other form of protection to prevent STIs (sexually transmitted infections).  Talk with your health care provider about taking a low-dose aspirin or statin. What's next?  Visit your health care provider once a year for a well check visit.  Ask your health care provider how often you should have your eyes and teeth checked.  Stay up to date on all vaccines. This information is not intended to replace advice given to you by your health care provider. Make sure you discuss any questions you have with your health care provider. Document Revised: 09/30/2018 Document Reviewed: 09/30/2018 Elsevier Patient Education  2020 [employer].

## 2020-01-10 ENCOUNTER — Other Ambulatory Visit: Payer: Self-pay

## 2020-01-10 NOTE — Patient Instructions (Addendum)
It was good to see you again today, I will be in touch with your labs as soon as possible.  Take care!  I would encourage you to have the shingles vaccine given at your pharmacy Please be sure to have an annual eye exam   Health Maintenance After Age 69 After age 44, you are at a higher risk for certain long-term diseases and infections as well as injuries from falls. Falls are a major cause of broken bones and head injuries in people who are older than age 84. Getting regular preventive care can help to keep you healthy and well. Preventive care includes getting regular testing and making lifestyle changes as recommended by your health care provider. Talk with your health care provider about:  Which screenings and tests you should have. A screening is a test that checks for a disease when you have no symptoms.  A diet and exercise plan that is right for you. What should I know about screenings and tests to prevent falls? Screening and testing are the best ways to find a health problem early. Early diagnosis and treatment give you the best chance of managing medical conditions that are common after age 56. Certain conditions and lifestyle choices may make you more likely to have a fall. Your health care provider may recommend:  Regular vision checks. Poor vision and conditions such as cataracts can make you more likely to have a fall. If you wear glasses, make sure to get your prescription updated if your vision changes.  Medicine review. Work with your health care provider to regularly review all of the medicines you are taking, including over-the-counter medicines. Ask your health care provider about any side effects that may make you more likely to have a fall. Tell your health care provider if any medicines that you take make you feel dizzy or sleepy.  Osteoporosis screening. Osteoporosis is a condition that causes the bones to get weaker. This can make the bones weak and cause them to break  more easily.  Blood pressure screening. Blood pressure changes and medicines to control blood pressure can make you feel dizzy.  Strength and balance checks. Your health care provider may recommend certain tests to check your strength and balance while standing, walking, or changing positions.  Foot health exam. Foot pain and numbness, as well as not wearing proper footwear, can make you more likely to have a fall.  Depression screening. You may be more likely to have a fall if you have a fear of falling, feel emotionally low, or feel unable to do activities that you used to do.  Alcohol use screening. Using too much alcohol can affect your balance and may make you more likely to have a fall. What actions can I take to lower my risk of falls? General instructions  Talk with your health care provider about your risks for falling. Tell your health care provider if: ? You fall. Be sure to tell your health care provider about all falls, even ones that seem minor. ? You feel dizzy, sleepy, or off-balance.  Take over-the-counter and prescription medicines only as told by your health care provider. These include any supplements.  Eat a healthy diet and maintain a healthy weight. A healthy diet includes low-fat dairy products, low-fat (lean) meats, and fiber from whole grains, beans, and lots of fruits and vegetables. Home safety  Remove any tripping hazards, such as rugs, cords, and clutter.  Install safety equipment such as grab bars in bathrooms and  safety rails on stairs.  Keep rooms and walkways well-lit. Activity   Follow a regular exercise program to stay fit. This will help you maintain your balance. Ask your health care provider what types of exercise are appropriate for you.  If you need a cane or walker, use it as recommended by your health care provider.  Wear supportive shoes that have nonskid soles. Lifestyle  Do not drink alcohol if your health care provider tells you not  to drink.  If you drink alcohol, limit how much you have: ? 0-1 drink a day for women. ? 0-2 drinks a day for men.  Be aware of how much alcohol is in your drink. In the [place]., one drink equals one typical bottle of beer (12 oz), one-half glass of wine (5 oz), or one shot of hard liquor (1 oz).  Do not use any products that contain nicotine or tobacco, such as cigarettes and e-cigarettes. If you need help quitting, ask your health care provider. Summary  Having a healthy lifestyle and getting preventive care can help to protect your health and wellness after age 25.  Screening and testing are the best way to find a health problem early and help you avoid having a fall. Early diagnosis and treatment give you the best chance for managing medical conditions that are more common for people who are older than age 56.  Falls are a major cause of broken bones and head injuries in people who are older than age 71. Take precautions to prevent a fall at home.  Work with your health care provider to learn what changes you can make to improve your health and wellness and to prevent falls. This information is not intended to replace advice given to you by your health care provider. Make sure you discuss any questions you have with your health care provider. Document Revised: 01/27/2019 Document Reviewed: 08/19/2017 Elsevier Patient Education  2020 [employer].

## 2020-01-10 NOTE — Progress Notes (Addendum)
[place] [facility] [address], [place], [place] [address] [phone]  Date:  01/11/2020   Name:  Arthur Rich   DOB:  01-Jun-1951   MRN:  [id number]  PCP:  [name], MD    Chief Complaint: Annual Exam   History of Present Illness:  Arthur Rich is a 69 y.o. very pleasant male patient who presents with the following:  Patient with history of hypertension, CAD, atrial fib, diabetes, hyperlipidemia, sleep apnea, gout, TIA here today for routine physical/Medicare  Last seen by myself in December 2019  He was seen by the atrial fibrillation clinic in [place] has permanent A. fib, taking Xarelto, diltiazem, Toprol.  His general cardiologist is Dr. Daneen Schick He uses CPAP for his sleep apnea, followed by Dr. Elenore Rota.  Last visit in December- he will follow-up, he is having a hard time using the machine right now and as such is not really able to be compliant  Foot exam-today Eye exam- he will catch up on this  Labs are due- he is fasting today Colon cancer screening- this was normal about 10 years ago He would like to start with cologuard.  Will order for him  Lab Results  Component Value Date   HGBA1C 6.3 10/04/2018   Pneumonia series is complete Covid vaccine- he is done with this  Shingrix; advised him to have this done at his pharmacy at his convenience   He has noted some off and on tingling/ numbness in his feet for 7-8 years; becoming a bit more problematic at night.  He is so far not interested in using any medication for same -it is not that troublesome  He is walking about 30 minutes most days of the week  He lives alone, has been really isolating during the pandemic.  His mother is 78 and lives in [place].  She has now completed her Covid series, he plans to see her over [place] He feels that he has done generally well during the pandemic and is grateful to have been in good  health  Patient Active Problem List   Diagnosis Date Noted  . Type 2 diabetes mellitus with diabetic neuropathy, unspecified ([place]) 01/11/2020  . Abnormal CT of the chest 07/12/2018  . Hemoptysis 07/12/2018  . History of tobacco use 07/12/2018  . Controlled type 2 diabetes mellitus without complication, without long-term current use of insulin ([place]) 11/28/2016  . Obesity (BMI 30-39.9) 09/28/2015  . Vertigo 07/06/2015  . OSA (obstructive sleep apnea) 04/10/2015  . Excessive daytime sleepiness 04/10/2015  . Permanent atrial fibrillation ([place]) 10/25/2014  . Anticoagulation adequate with anticoagulant therapy 07/20/2013  . Umbilical hernia [id number]  . SYNCOPE 05/01/2010  . Mixed hyperlipidemia 04/30/2010  . ESSENTIAL HYPERTENSION, BENIGN 04/30/2010  . Coronary artery disease involving native coronary artery of native heart without angina pectoris 04/30/2010  . ATRIAL FLUTTER 04/30/2010    Past Medical History:  Diagnosis Date  . Atrial flutter ([place])   . Cancer ([place])    skin  . Coronary artery disease involving native coronary artery of native heart without angina pectoris 04/30/2010   LHC 9/09:  S/p BMS to pLAD // Myoview 4/18: No ischemia, low risk, not gated  . Hyperlipidemia   . Hypertension   . Obesity (BMI 30-39.9) 09/28/2015  . Overweight(278.02)   . Permanent atrial fibrillation ([place]) 10/25/2014   Echo 10/14: EF 55-65%, mild MR, mod LAE, PASP 31 mmHg // Holter 9/16:  AFib, avg HR 70, rare HR up to 150, rare brady with 35 bpm and 3 sec pause during HS // Echo 4/18: EF 55-60, no RWMA, mild LAE, moderate RAE, trivial TR, PASP 28  . Syncope and collapse 03/29/2010  . TIA (transient ischemic attack)     Past Surgical History:  Procedure Laterality Date  . CARDIAC CATHETERIZATION  9/09   PCI LAD (BMS)  . nasal septal surgery    . TONSILLECTOMY    . WISDOM TOOTH EXTRACTION      Social History   Tobacco Use  . Smoking status: Former Smoker    Packs/day: 1.00    Years:  30.00    Pack years: 30.00    Types: Cigarettes    Quit date: 08/26/1996    Years since quitting: 23.3  . Smokeless tobacco: Never Used  Substance Use Topics  . Alcohol use: Yes    Alcohol/week: 2.0 standard drinks    Types: 2 Cans of beer per week    Comment: previously heavy, now trying to cut back  . Drug use: No    Family History  Problem Relation Age of Onset  . Diabetes Father   . Heart disease Father   . Heart attack Maternal Uncle        early 77's  . Cancer Maternal Grandmother        unknown ?leukemia    Allergies  Allergen Reactions  . Penicillins Rash    Has patient had a PCN reaction causing immediate rash, facial/tongue/throat swelling, SOB or lightheadedness with hypotension: Yes Has patient had a PCN reaction causing severe rash involving mucus membranes or skin necrosis: No Has patient had a PCN reaction that required hospitalization: No Has patient had a PCN reaction occurring within the last 10 years: No If all of the above answers are "NO", then may proceed with Cephalosporin use.    Medication list has been reviewed and updated.  Current Outpatient Medications on File Prior to Visit  Medication Sig Dispense Refill  . acetaminophen (TYLENOL) 500 MG tablet Take 500 mg by mouth every 6 (six) hours as needed for headache (pain).    . Cholecalciferol (VITAMIN D) 50 MCG (2000 UT) CAPS Take 2,000 Units by mouth daily.     Marland Kitchen diltiazem (CARDIZEM) 30 MG tablet Take 1 tablet (30 mg total) by mouth 4 (four) times daily. 30 tablet 6  . metoprolol succinate (TOPROL-XL) 100 MG 24 hr tablet Take 1 tablet in the AM and 1/2 tablet in the PM 180 tablet 3  . Polyethyl Glycol-Propyl Glycol (SYSTANE OP) Place 1 drop into both eyes daily as needed (dry eyes).     . rivaroxaban (XARELTO) 20 MG TABS tablet TAKE 1 TABLET (20 MG TOTAL) BY MOUTH DAILY WITH SUPPER. 90 tablet 1  . diltiazem (CARDIZEM CD) 180 MG 24 hr capsule Take 1 capsule (180 mg total) by mouth daily. 90 capsule 3    No current facility-administered medications on file prior to visit.    Review of Systems:  As per HPI- otherwise negative.   Physical Examination: Vitals:   01/11/20 1302 01/11/20 1321  BP: (!) 154/92 138/90  Pulse: 71   Resp: 16   SpO2: 96%    Vitals:   01/11/20 1302  Weight: 280 lb (127 kg)  Height: 6\' 2"  (1.88 m)   Body mass index is 35.95 kg/m. Ideal Body Weight: Weight in (lb) to have BMI = 25: 194.3  GEN: no acute distress.  Obese, looks well HEENT:  Atraumatic, Normocephalic.   PEERL , TM within normal limits bilaterally Ears and Nose: No external deformity. CV: Rate controlled atrial fib, No M/G/R. No JVD. No thrill. No extra heart sounds. PULM: CTA B, no wheezes, crackles, rhonchi. No retractions. No resp. distress. No accessory muscle use. ABD: S, NT, ND, +BS. No rebound. No HSM.  Umbilical hernia- stable, non tender  EXTR: No c/c/e PSYCH: Normally interactive. Conversant.  Foot exam; he cannot sense right great toe, otherwise normal  Assessment and Plan: Permanent atrial fibrillation ([place]) - Plan: CBC  Controlled type 2 diabetes mellitus without complication, without long-term current use of insulin ([facility]) - Plan: Comprehensive metabolic panel, Hemoglobin A1c, Microalbumin / creatinine urine ratio, metFORMIN (GLUCOPHAGE) 500 MG tablet  Coronary artery disease involving native coronary artery of native heart without angina pectoris - Plan: Lipid panel  Screening for prostate cancer - Plan: PSA, Medicare ( [facility] only)  Essential hypertension - Plan: CBC, Comprehensive metabolic panel, lisinopril (ZESTRIL) 40 MG tablet  Mixed hyperlipidemia - Plan: Lipid panel, atorvastatin (LIPITOR) 80 MG tablet  Chronic idiopathic gout involving toe of right foot without tophus - Plan: allopurinol (ZYLOPRIM) 100 MG tablet, Uric acid  Type 2 diabetes mellitus with diabetic neuropathy, without long-term current use of insulin (HCC)  Colon cancer  screening  Following up today for health maintenance review of chronic conditions Neuropathy is becoming a bit more problematic, I encouraged him to work towards good control of his blood sugar and weight loss.  For the time being he declines any medication for same He continues to follow-up with atrial fibrillation and cardiology, continue anticoagulation.  Rate is well controlled Blood pressure okay, patient reports typically well controlled when he checks at home Check uric acid level today Order Cologuard This visit occurred during the SARS-CoV-2 public health emergency.  Safety protocols were in place, including screening questions prior to the visit, additional usage of staff PPE, and extensive cleaning of exam room while observing appropriate contact time as indicated for disinfecting solutions.   Moderate medical decision making today Discussed diet, exercise, avoidance of tobacco and excessive alcohol  Signed Lamar Blinks, MD  Received his labs 3/25, message to patient  Results for orders placed or performed in visit on 01/11/20  CBC  Result Value Ref Range   WBC 7.3 4.0 - 10.5 K/uL   RBC 4.59 4.22 - 5.81 Mil/uL   Platelets 234.0 150.0 - 400.0 K/uL   Hemoglobin 14.2 13.0 - 17.0 g/dL   HCT 42.1 39.0 - 52.0 %   MCV 91.7 78.0 - 100.0 fl   MCHC 33.9 30.0 - 36.0 g/dL   RDW 13.7 11.5 - 15.5 %  Comprehensive metabolic panel  Result Value Ref Range   Sodium 137 135 - 145 mEq/L   Potassium 4.5 3.5 - 5.1 mEq/L   Chloride 102 96 - 112 mEq/L   CO2 26 19 - 32 mEq/L   Glucose, Bld 120 (H) 70 - 99 mg/dL   BUN 14 6 - 23 mg/dL   Creatinine, Ser 0.99 0.40 - 1.50 mg/dL   Total Bilirubin 1.6 (H) 0.2 - 1.2 mg/dL   Alkaline Phosphatase 81 39 - 117 U/L   AST 27 0 - 37 U/L   ALT 21 0 - 53 U/L   Total Protein 7.2 6.0 - 8.3 g/dL   Albumin 4.2 3.5 - 5.2 g/dL   GFR 75.08 >60.00 mL/min   Calcium 9.2 8.4 - 10.5 mg/dL  Hemoglobin A1c  Result Value Ref Range  Hgb A1c MFr Bld 6.4 4.6 - 6.5  %  Lipid panel  Result Value Ref Range   Cholesterol 162 0 - 200 mg/dL   Triglycerides 154.0 (H) 0.0 - 149.0 mg/dL   HDL 44.50 >39.00 mg/dL   VLDL 30.8 0.0 - 40.0 mg/dL   LDL Cholesterol 86 0 - 99 mg/dL   Total CHOL/HDL Ratio 4    NonHDL 117.12   Microalbumin / creatinine urine ratio  Result Value Ref Range   Microalb, Ur 1.1 0.0 - 1.9 mg/dL   Creatinine,U 135.2 mg/dL   Microalb Creat Ratio 0.8 0.0 - 30.0 mg/g  PSA, Medicare ( [facility] only)  Result Value Ref Range   PSA 0.65 0.10 - 4.00 ng/ml  Uric acid  Result Value Ref Range   Uric Acid, Serum 6.9 4.0 - 7.8 mg/dL

## 2020-01-11 ENCOUNTER — Encounter: Payer: Self-pay | Admitting: Family Medicine

## 2020-01-11 ENCOUNTER — Ambulatory Visit (INDEPENDENT_AMBULATORY_CARE_PROVIDER_SITE_OTHER): Payer: Medicare Other | Admitting: Family Medicine

## 2020-01-11 ENCOUNTER — Other Ambulatory Visit: Payer: Self-pay

## 2020-01-11 VITALS — BP 138/90 | HR 71 | Resp 16 | Ht 74.0 in | Wt 280.0 lb

## 2020-01-11 DIAGNOSIS — Z1211 Encounter for screening for malignant neoplasm of colon: Secondary | ICD-10-CM

## 2020-01-11 DIAGNOSIS — I10 Essential (primary) hypertension: Secondary | ICD-10-CM | POA: Diagnosis not present

## 2020-01-11 DIAGNOSIS — E119 Type 2 diabetes mellitus without complications: Secondary | ICD-10-CM | POA: Diagnosis not present

## 2020-01-11 DIAGNOSIS — I2510 Atherosclerotic heart disease of native coronary artery without angina pectoris: Secondary | ICD-10-CM | POA: Diagnosis not present

## 2020-01-11 DIAGNOSIS — E782 Mixed hyperlipidemia: Secondary | ICD-10-CM

## 2020-01-11 DIAGNOSIS — Z125 Encounter for screening for malignant neoplasm of prostate: Secondary | ICD-10-CM

## 2020-01-11 DIAGNOSIS — M1A0710 Idiopathic chronic gout, right ankle and foot, without tophus (tophi): Secondary | ICD-10-CM | POA: Diagnosis not present

## 2020-01-11 DIAGNOSIS — E1140 Type 2 diabetes mellitus with diabetic neuropathy, unspecified: Secondary | ICD-10-CM | POA: Diagnosis not present

## 2020-01-11 DIAGNOSIS — I4821 Permanent atrial fibrillation: Secondary | ICD-10-CM

## 2020-01-11 LAB — COMPREHENSIVE METABOLIC PANEL
ALT: 21 U/L (ref 0–53)
AST: 27 U/L (ref 0–37)
Albumin: 4.2 g/dL (ref 3.5–5.2)
Alkaline Phosphatase: 81 U/L (ref 39–117)
BUN: 14 mg/dL (ref 6–23)
CO2: 26 mEq/L (ref 19–32)
Calcium: 9.2 mg/dL (ref 8.4–10.5)
Chloride: 102 mEq/L (ref 96–112)
Creatinine, Ser: 0.99 mg/dL (ref 0.40–1.50)
GFR: 75.08 mL/min (ref >60.00)
Glucose, Bld: 120 mg/dL — ABNORMAL HIGH (ref 70–99)
Potassium: 4.5 mEq/L (ref 3.5–5.1)
Sodium: 137 mEq/L (ref 135–145)
Total Bilirubin: 1.6 mg/dL — ABNORMAL HIGH (ref 0.2–1.2)
Total Protein: 7.2 g/dL (ref 6.0–8.3)

## 2020-01-11 LAB — CBC
HCT: 42.1 % (ref 39.0–52.0)
Hemoglobin: 14.2 g/dL (ref 13.0–17.0)
MCHC: 33.9 g/dL (ref 30.0–36.0)
MCV: 91.7 fl (ref 78.0–100.0)
Platelets: 234 10*3/uL (ref 150.0–400.0)
RBC: 4.59 Mil/uL (ref 4.22–5.81)
RDW: 13.7 % (ref 11.5–15.5)
WBC: 7.3 10*3/uL (ref 4.0–10.5)

## 2020-01-11 LAB — LIPID PANEL
Cholesterol: 162 mg/dL (ref 0–200)
HDL: 44.5 mg/dL (ref >39.00)
LDL Cholesterol: 86 mg/dL (ref 0–99)
NonHDL: 117.12
Total CHOL/HDL Ratio: 4
Triglycerides: 154 mg/dL — ABNORMAL HIGH (ref 0.0–149.0)
VLDL: 30.8 mg/dL (ref 0.0–40.0)

## 2020-01-11 LAB — HEMOGLOBIN A1C: Hgb A1c MFr Bld: 6.4 % (ref 4.6–6.5)

## 2020-01-11 LAB — MICROALBUMIN / CREATININE URINE RATIO
Creatinine,U: 135.2 mg/dL
Microalb Creat Ratio: 0.8 mg/g (ref 0.0–30.0)
Microalb, Ur: 1.1 mg/dL (ref 0.0–1.9)

## 2020-01-11 LAB — URIC ACID: Uric Acid, Serum: 6.9 mg/dL (ref 4.0–7.8)

## 2020-01-11 LAB — PSA, MEDICARE: PSA: 0.65 ng/ml (ref 0.10–4.00)

## 2020-01-11 IMAGING — DX DG CHEST 1V PORT
2 series · 2 of 2 positions shown · non-contrast
Comparison: CT chest May 19, 2018

CLINICAL DATA: Hypertensive. Palpitations. History of coronary
artery disease, atrial fibrillation and hypertension.

EXAM:
PORTABLE CHEST 1 VIEW

[chest ap (1 of 2)]
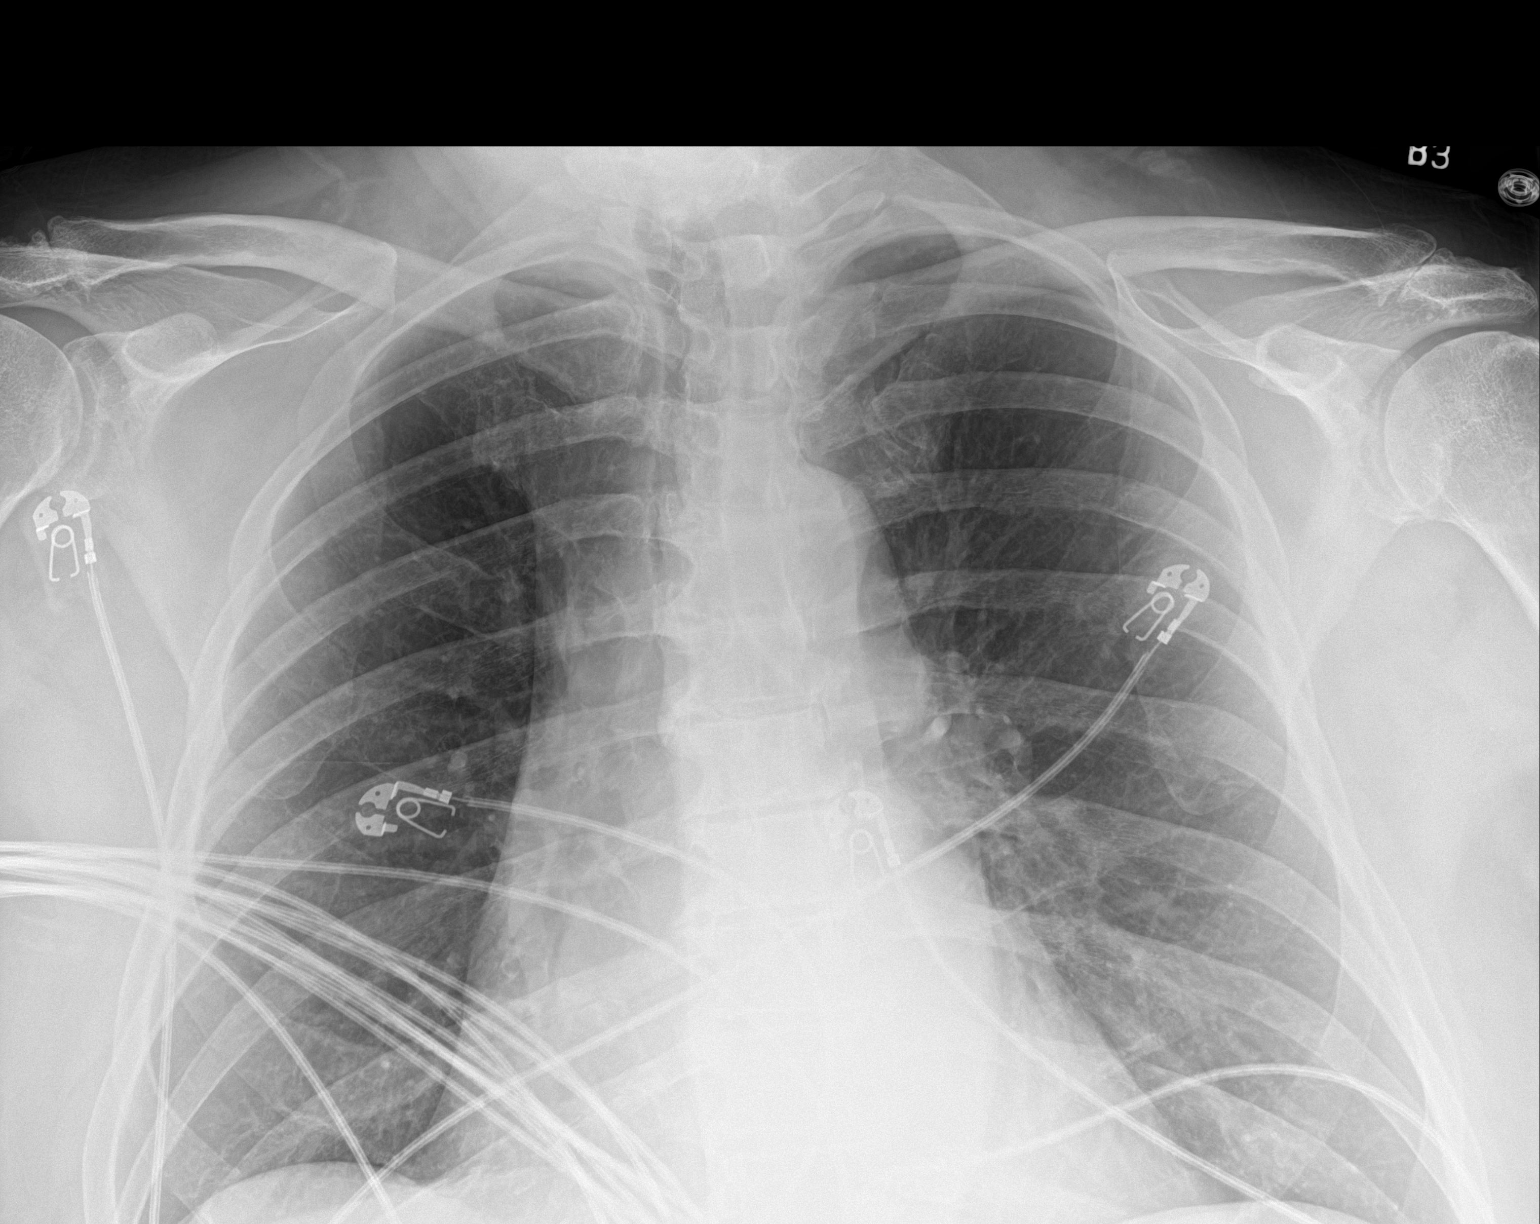

[chest ap (2 of 2)]
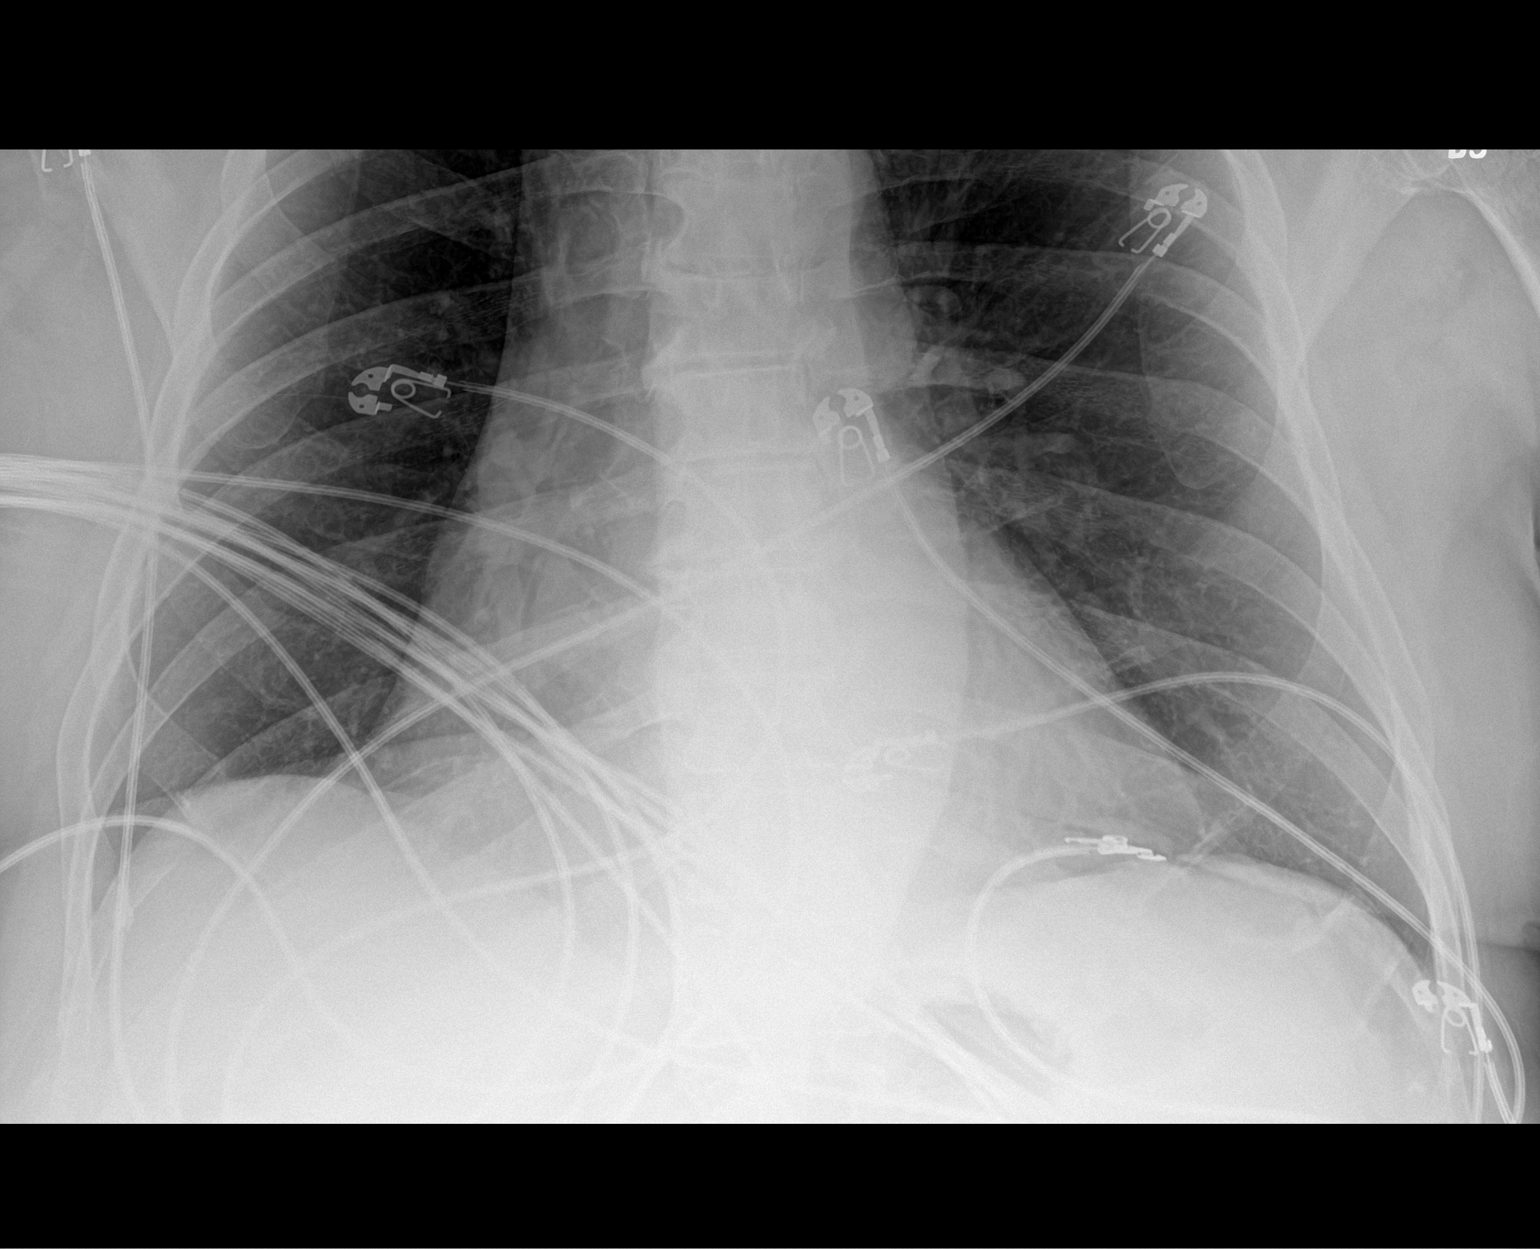

[2 of 2 positions shown; findings below may reference images not displayed]

FINDINGS: Cardiac silhouette is mildly enlarged. Mediastinal silhouette is
unremarkable. No pleural effusion or focal consolidation. No
pneumothorax. Soft tissue planes and included osseous structures are
nonacute. Advanced AC osteoarthrosis.
IMPRESSION: Mild cardiomegaly.  No acute pulmonary process.

## 2020-01-11 MED ORDER — LISINOPRIL 40 MG PO TABS: 40.0000 mg | ORAL_TABLET | Freq: Every day | ORAL | 3 refills | Status: DC

## 2020-01-11 MED ORDER — ATORVASTATIN CALCIUM 80 MG PO TABS: 80.0000 mg | ORAL_TABLET | Freq: Every day | ORAL | 3 refills | Status: DC

## 2020-01-11 MED ORDER — ALLOPURINOL 100 MG PO TABS: 100.0000 mg | ORAL_TABLET | Freq: Every day | ORAL | 3 refills | Status: DC

## 2020-01-11 MED ORDER — METFORMIN HCL 500 MG PO TABS: 500.0000 mg | ORAL_TABLET | Freq: Every day | ORAL | 3 refills | Status: DC

## 2020-01-12 ENCOUNTER — Encounter: Payer: Self-pay | Admitting: Family Medicine

## 2020-01-12 NOTE — Addendum Note (Signed)
Addended by: Lamar Blinks C on: 01/12/2020 05:55 AM   Modules accepted: Orders

## 2020-01-17 ENCOUNTER — Other Ambulatory Visit (INDEPENDENT_AMBULATORY_CARE_PROVIDER_SITE_OTHER): Payer: Medicare Other

## 2020-01-17 ENCOUNTER — Other Ambulatory Visit: Payer: Self-pay

## 2020-01-17 LAB — BILIRUBIN, FRACTIONATED(TOT/DIR/INDIR)
Bilirubin, Direct: 0.3 mg/dL — ABNORMAL HIGH (ref 0.0–0.2)
Indirect Bilirubin: 0.8 mg/dL (calc) (ref 0.2–1.2)
Total Bilirubin: 1.1 mg/dL (ref 0.2–1.2)

## 2020-03-01 DIAGNOSIS — Z1211 Encounter for screening for malignant neoplasm of colon: Secondary | ICD-10-CM | POA: Diagnosis not present

## 2020-03-01 LAB — COLOGUARD: Cologuard: NEGATIVE

## 2020-03-07 LAB — COLOGUARD: COLOGUARD: NEGATIVE

## 2020-03-07 LAB — EXTERNAL GENERIC LAB PROCEDURE: COLOGUARD: NEGATIVE

## 2020-03-08 ENCOUNTER — Encounter: Payer: Self-pay | Admitting: Family Medicine

## 2020-03-22 ENCOUNTER — Other Ambulatory Visit (HOSPITAL_COMMUNITY): Payer: Self-pay | Admitting: Physician Assistant

## 2020-05-30 DIAGNOSIS — G4733 Obstructive sleep apnea (adult) (pediatric): Secondary | ICD-10-CM | POA: Diagnosis not present

## 2020-06-16 DIAGNOSIS — G4733 Obstructive sleep apnea (adult) (pediatric): Secondary | ICD-10-CM | POA: Diagnosis not present

## 2020-06-19 DIAGNOSIS — G4733 Obstructive sleep apnea (adult) (pediatric): Secondary | ICD-10-CM | POA: Diagnosis not present

## 2020-06-19 DIAGNOSIS — G4731 Primary central sleep apnea: Secondary | ICD-10-CM | POA: Diagnosis not present

## 2020-06-28 ENCOUNTER — Other Ambulatory Visit: Payer: Self-pay | Admitting: Physician Assistant

## 2020-06-29 ENCOUNTER — Other Ambulatory Visit (HOSPITAL_BASED_OUTPATIENT_CLINIC_OR_DEPARTMENT_OTHER): Payer: Self-pay

## 2020-06-29 DIAGNOSIS — G4731 Primary central sleep apnea: Secondary | ICD-10-CM

## 2020-07-10 ENCOUNTER — Ambulatory Visit: Payer: Medicare Other | Admitting: Nurse Practitioner

## 2020-07-16 NOTE — Progress Notes (Signed)
CARDIOLOGY OFFICE NOTE  Date:  07/25/2020    Arthur Rich Date of Birth: 1951/08/26 Medical Record #[id number]  PCP:  [name], MD  Cardiologist:  [name] & Allred Chief Complaint  Patient presents with  . Follow-up    Seen for Dr. [name] & Allred    History of Present Illness: Arthur Rich is a 69 y.o. male who presents today for a follow up visit.   He has a history of persistent AF, OSA - not compliant with CPAP, DM, HTN, and CAD with prior BMS to the LAD in 2009. CHADSVASC of at least 6 - he is on Xarelto.   He has not seen Dr. [name] since 2016 - has basically been followed by APP's and the AF clinic.   Last seen in the AF clinic back in December - felt to be doing ok.   Comes in today. Here alone. Doing well. He feels like he is doing well. No chest pain. Not short of breath unless he really over exerts himself. He has persistent AF - really does not bother him. Remains on Xarelto. No bleeding/bruising. Tries to walk every day. Admits his diet could be better. Tolerating his medicines. Overall, no real concerns.   Past Medical History:  Diagnosis Date  . Atrial flutter ([place])   . Cancer ([place])    skin  . Coronary artery disease involving native coronary artery of native heart without angina pectoris 04/30/2010   LHC 9/09:  S/p BMS to pLAD // Myoview 4/18: No ischemia, low risk, not gated  . Hyperlipidemia   . Hypertension   . Obesity (BMI 30-39.9) 09/28/2015  . Overweight(278.02)   . Permanent atrial fibrillation ([place]) 10/25/2014   Echo 10/14: EF 55-65%, mild MR, mod LAE, PASP 31 mmHg // Holter 9/16:  AFib, avg HR 70, rare HR up to 150, rare brady with 35 bpm and 3 sec pause during HS // Echo 4/18: EF 55-60, no RWMA, mild LAE, moderate RAE, trivial TR, PASP 28  . Syncope and collapse 03/29/2010  . TIA (transient ischemic attack)     Past Surgical History:  Procedure Laterality Date  . CARDIAC CATHETERIZATION  9/09   PCI LAD (BMS)  . nasal  septal surgery    . TONSILLECTOMY    . WISDOM TOOTH EXTRACTION       Medications: Current Meds  Medication Sig  . acetaminophen (TYLENOL) 500 MG tablet Take 500 mg by mouth every 6 (six) hours as needed for headache (pain).  Marland Kitchen allopurinol (ZYLOPRIM) 100 MG tablet Take 1 tablet (100 mg total) by mouth daily.  Marland Kitchen atorvastatin (LIPITOR) 80 MG tablet Take 1 tablet (80 mg total) by mouth daily.  . Cholecalciferol (VITAMIN D) 50 MCG (2000 UT) CAPS Take 2,000 Units by mouth daily.   Marland Kitchen diltiazem (CARDIZEM) 30 MG tablet Take 1 tablet (30 mg total) by mouth 4 (four) times daily.  Marland Kitchen lisinopril (ZESTRIL) 40 MG tablet Take 1 tablet (40 mg total) by mouth daily.  . metFORMIN (GLUCOPHAGE) 500 MG tablet Take 1 tablet (500 mg total) by mouth daily with supper.  . metoprolol succinate (TOPROL-XL) 100 MG 24 hr tablet TAKE 1 TABLET IN THE AM AND 1/2 TABLET IN THE PM  . Polyethyl Glycol-Propyl Glycol (SYSTANE OP) Place 1 drop into both eyes daily as needed (dry eyes).   [name] 20 MG TABS tablet TAKE 1 TABLET (20 MG TOTAL) BY MOUTH DAILY WITH SUPPER.     Allergies: Allergies  Allergen Reactions  . Penicillins Rash    Has patient had a PCN reaction causing immediate rash, facial/tongue/throat swelling, SOB or lightheadedness with hypotension: Yes Has patient had a PCN reaction causing severe rash involving mucus membranes or skin necrosis: No Has patient had a PCN reaction that required hospitalization: No Has patient had a PCN reaction occurring within the last 10 years: No If all of the above answers are "NO", then may proceed with Cephalosporin use.    Social History: The patient  reports that he quit smoking about 23 years ago. His smoking use included cigarettes. He has a 30.00 pack-year smoking history. He has never used smokeless tobacco. He reports current alcohol use of about 2.0 standard drinks of alcohol per week. He reports that he does not use drugs.   Family History: The patient's  family history includes Cancer in his maternal grandmother; Diabetes in his father; Heart attack in his maternal uncle; Heart disease in his father.   Review of Systems: Please see the history of present illness.   All other systems are reviewed and negative.   Physical Exam: VS:  BP (!) 154/88   Pulse 92   Ht 6\' 2"  (1.88 m)   Wt 276 lb 12.8 oz (125.6 kg)   SpO2 98%   BMI 35.54 kg/m  .  BMI Body mass index is 35.54 kg/m.  Wt Readings from Last 3 Encounters:  07/25/20 276 lb 12.8 oz (125.6 kg)  01/11/20 280 lb (127 kg)  09/26/19 279 lb 12.8 oz (126.9 kg)    General: Alert and in no acute distress. Remains obese.    Cardiac: Irregular irregular rhythm. Rate is fine.  No edema.  Respiratory:  Lungs are clear to auscultation bilaterally with normal work of breathing.  GI: Soft and nontender.  MS: No deformity or atrophy. Gait and ROM intact.  Skin: Warm and dry. Color is normal.  Neuro:  Strength and sensation are intact and no gross focal deficits noted.  Psych: Alert, appropriate and with normal affect.   LABORATORY DATA:  EKG:  EKG is not ordered today.    Lab Results  Component Value Date   WBC 7.3 01/11/2020   HGB 14.2 01/11/2020   HCT 42.1 01/11/2020   PLT 234.0 01/11/2020   GLUCOSE 120 (H) 01/11/2020   CHOL 162 01/11/2020   TRIG 154.0 (H) 01/11/2020   HDL 44.50 01/11/2020   LDLDIRECT 107.0 11/27/2016   LDLCALC 86 01/11/2020   ALT 21 01/11/2020   AST 27 01/11/2020   NA 137 01/11/2020   K 4.5 01/11/2020   CL 102 01/11/2020   CREATININE 0.99 01/11/2020   BUN 14 01/11/2020   CO2 26 01/11/2020   TSH 1.690 10/06/2018   PSA 0.65 01/11/2020   INR 1.5 08/05/2013   HGBA1C 6.4 01/11/2020   MICROALBUR 1.1 01/11/2020     BNP (last 3 results) No results for input(s): BNP in the last 8760 hours.  ProBNP (last 3 results) No results for input(s): PROBNP in the last 8760 hours.   Other Studies Reviewed Today:  Myoview Study Highlights 09/2018    Nuclear  stress EF: 67%.  The study is normal.  This is a low risk study.  There was no ST segment deviation noted during stress.   Low risk stress nuclear study with normal perfusion and normal left ventricular regional and global systolic function.   Echo 01/27/17 Demonstrated  - Left ventricle: The cavity size was normal. Wall thickness was normal. Systolic function was  normal. The estimated ejection fraction was in the range of 55% to 60%. Wall motion was normal; there were no regional wall motion abnormalities. The study is not technically sufficient to allow evaluation of LV diastolic function. - Left atrium: The atrium was mildly dilated. - Right atrium: Moderately dilated. - Tricuspid valve: There was trivial regurgitation. - Pulmonary arteries: PA peak pressure: 28 mm Hg (S). - Inferior vena cava: The vessel was normal in size. The respirophasic diameter changes were in the normal range (= 50%), consistent with normal central venous pressure.    ASSESSMENT & PLAN:  1. Persistent AF - managed with rate control and continued anticoagulation. HR is fine by my exam. Would continue with his current regimen.   2. HTN - recheck by me is 130/90 in both arms - he is to monitor at home - send [place] a message in about 2 weeks with an update - could increase CCB if needed - for now, I have left him on his current regimen.   3. Obesity - encouragement given.   4. OSA - unfortunately, he is intolerant to CPAP.   5. HLD - on statin - lab today.   6. CAD - remote PCI - no worrisome symptoms - needs aggressive CV risk factor modification which is a struggle for him.   Current medicines are reviewed with the patient today.  The patient does not have concerns regarding medicines other than what has been noted above.  The following changes have been made:  See above.  Labs/ tests ordered today include:    Orders Placed This Encounter  Procedures  . Basic metabolic panel  .  CBC  . Hepatic function panel  . Lipid panel     Disposition:   FU with Dr. [name] in 6 months; Dr. [name] in 9 months. Labs today. BP diary over the next 2 weeks and will send [place] an update.    Patient is agreeable to this plan and will call if any problems develop in the interim.   SignedTruitt Merle, NP  07/25/2020 10:19 AM  [facility] [address] [place], [place]  [address] Phone: [phone] Fax: [phone]

## 2020-07-18 DIAGNOSIS — Z23 Encounter for immunization: Secondary | ICD-10-CM | POA: Diagnosis not present

## 2020-07-25 ENCOUNTER — Ambulatory Visit (INDEPENDENT_AMBULATORY_CARE_PROVIDER_SITE_OTHER): Payer: Medicare Other | Admitting: Nurse Practitioner

## 2020-07-25 ENCOUNTER — Encounter: Payer: Self-pay | Admitting: Nurse Practitioner

## 2020-07-25 ENCOUNTER — Other Ambulatory Visit: Payer: Self-pay

## 2020-07-25 VITALS — BP 154/88 | HR 92 | Ht 74.0 in | Wt 276.8 lb

## 2020-07-25 DIAGNOSIS — I4821 Permanent atrial fibrillation: Secondary | ICD-10-CM

## 2020-07-25 DIAGNOSIS — I10 Essential (primary) hypertension: Secondary | ICD-10-CM | POA: Diagnosis not present

## 2020-07-25 DIAGNOSIS — I2510 Atherosclerotic heart disease of native coronary artery without angina pectoris: Secondary | ICD-10-CM

## 2020-07-25 DIAGNOSIS — E782 Mixed hyperlipidemia: Secondary | ICD-10-CM | POA: Diagnosis not present

## 2020-07-25 LAB — HEPATIC FUNCTION PANEL
ALT: 16 IU/L (ref 0–44)
AST: 23 IU/L (ref 0–40)
Albumin: 4.3 g/dL (ref 3.8–4.8)
Alkaline Phosphatase: 92 IU/L (ref 44–121)
Bilirubin Total: 1.3 mg/dL — ABNORMAL HIGH (ref 0.0–1.2)
Bilirubin, Direct: 0.32 mg/dL (ref 0.00–0.40)
Total Protein: 7.3 g/dL (ref 6.0–8.5)

## 2020-07-25 LAB — CBC
Hematocrit: 43.6 % (ref 37.5–51.0)
Hemoglobin: 15.1 g/dL (ref 13.0–17.7)
MCH: 30.9 pg (ref 26.6–33.0)
MCHC: 34.6 g/dL (ref 31.5–35.7)
MCV: 89 fL (ref 79–97)
Platelets: 227 10*3/uL (ref 150–450)
RBC: 4.89 x10E6/uL (ref 4.14–5.80)
RDW: 12.9 % (ref 11.6–15.4)
WBC: 7.1 10*3/uL (ref 3.4–10.8)

## 2020-07-25 LAB — BASIC METABOLIC PANEL
BUN/Creatinine Ratio: 16 (ref 10–24)
BUN: 18 mg/dL (ref 8–27)
CO2: 22 mmol/L (ref 20–29)
Calcium: 9.4 mg/dL (ref 8.6–10.2)
Chloride: 103 mmol/L (ref 96–106)
Creatinine, Ser: 1.12 mg/dL (ref 0.76–1.27)
GFR calc Af Amer: 78 mL/min/{1.73_m2} (ref >59)
GFR calc non Af Amer: 67 mL/min/{1.73_m2} (ref >59)
Glucose: 131 mg/dL — ABNORMAL HIGH (ref 65–99)
Potassium: 5.7 mmol/L — ABNORMAL HIGH (ref 3.5–5.2)
Sodium: 139 mmol/L (ref 134–144)

## 2020-07-25 LAB — LIPID PANEL
Chol/HDL Ratio: 3.4 ratio (ref 0.0–5.0)
Cholesterol, Total: 171 mg/dL (ref 100–199)
HDL: 50 mg/dL (ref >39)
LDL Chol Calc (NIH): 98 mg/dL (ref 0–99)
Triglycerides: 128 mg/dL (ref 0–149)
VLDL Cholesterol Cal: 23 mg/dL (ref 5–40)

## 2020-07-25 NOTE — Patient Instructions (Addendum)
After Visit Summary:  We will be checking the following labs today - BMET, CBC, HPF and Lipids   Medication Instructions:    Continue with your current medicines.    If you need a refill on your cardiac medications before your next appointment, please call your pharmacy.     Testing/Procedures To Be Arranged:  N/A  Follow-Up:   See Dr. [name] in 6 months  Recall with Dr. [name] for 9 months -  You will receive a reminder letter in the mail two months in advance. If you don't receive a letter, please call our office to schedule the follow-up appointment.     At [facility], you and your health needs are our priority.  As part of our continuing mission to provide you with exceptional heart care, we have created designated Provider Care Teams.  These Care Teams include your primary Cardiologist (physician) and Advanced Practice Providers (APPs -  Physician Assistants and Nurse Practitioners) who all work together to provide you with the care you need, when you need it.  Special Instructions:  . Stay safe, wash your hands for at least 20 seconds and wear a mask when needed.  . It was good to talk with you today.  Marland Kitchen Keep a check on your BP for [place] - send [place] a message in about 2 weeks - goal BP is < 130/80 most of the time.  . Remember - less carbs and less sugars   Call the [place] office at [phone] if you have any questions, problems or concerns.

## 2020-07-26 ENCOUNTER — Other Ambulatory Visit: Payer: Self-pay | Admitting: *Deleted

## 2020-07-26 DIAGNOSIS — E782 Mixed hyperlipidemia: Secondary | ICD-10-CM

## 2020-07-26 DIAGNOSIS — E875 Hyperkalemia: Secondary | ICD-10-CM

## 2020-07-26 MED ORDER — EZETIMIBE 10 MG PO TABS: 10.0000 mg | ORAL_TABLET | Freq: Every day | ORAL | 3 refills | Status: DC

## 2020-08-05 ENCOUNTER — Other Ambulatory Visit: Payer: Self-pay

## 2020-08-05 ENCOUNTER — Ambulatory Visit (HOSPITAL_BASED_OUTPATIENT_CLINIC_OR_DEPARTMENT_OTHER): Payer: Medicare Other | Attending: Internal Medicine | Admitting: Internal Medicine

## 2020-08-05 DIAGNOSIS — G4731 Primary central sleep apnea: Secondary | ICD-10-CM | POA: Diagnosis not present

## 2020-08-05 DIAGNOSIS — G4733 Obstructive sleep apnea (adult) (pediatric): Secondary | ICD-10-CM | POA: Insufficient documentation

## 2020-08-08 ENCOUNTER — Other Ambulatory Visit: Payer: Medicare Other | Admitting: *Deleted

## 2020-08-08 ENCOUNTER — Other Ambulatory Visit: Payer: Self-pay

## 2020-08-08 ENCOUNTER — Other Ambulatory Visit: Payer: Medicare Other

## 2020-08-08 DIAGNOSIS — E875 Hyperkalemia: Secondary | ICD-10-CM | POA: Diagnosis not present

## 2020-08-08 DIAGNOSIS — E782 Mixed hyperlipidemia: Secondary | ICD-10-CM | POA: Diagnosis not present

## 2020-08-08 DIAGNOSIS — G4733 Obstructive sleep apnea (adult) (pediatric): Secondary | ICD-10-CM | POA: Diagnosis not present

## 2020-08-08 LAB — BASIC METABOLIC PANEL
BUN/Creatinine Ratio: 12 (ref 10–24)
BUN: 12 mg/dL (ref 8–27)
CO2: 22 mmol/L (ref 20–29)
Calcium: 9.4 mg/dL (ref 8.6–10.2)
Chloride: 103 mmol/L (ref 96–106)
Creatinine, Ser: 1.03 mg/dL (ref 0.76–1.27)
GFR calc Af Amer: 86 mL/min/{1.73_m2} (ref >59)
GFR calc non Af Amer: 74 mL/min/{1.73_m2} (ref >59)
Glucose: 121 mg/dL — ABNORMAL HIGH (ref 65–99)
Potassium: 5.2 mmol/L (ref 3.5–5.2)
Sodium: 137 mmol/L (ref 134–144)

## 2020-08-08 NOTE — Procedures (Signed)
   NAME: Arthur Rich DATE OF BIRTH:  [birth year] MEDICAL RECORD NUMBER [id number]  LOCATION: [facility]  PHYSICIAN: Marius Ditch  DATE OF STUDY: 08/05/2020  SLEEP STUDY TYPE: Positive Airway Pressure Titration               REFERRING PHYSICIAN: Marius Ditch, MD  INDICATION FOR STUDY: Patient with OSA and CSA on all studies. Cause of CSA uncertain as no opiates, CNS disease, or CHF. Failed auto adjusting CPAP.   EPWORTH SLEEPINESS SCORE:  NA HEIGHT: 6\' 2"  (188 cm)  WEIGHT: 270 lb (122.5 kg)    Body mass index is 34.67 kg/m.  NECK SIZE: 18 in.  MEDICATIONS Patient self administered medications include: N/A. Medications administered during study include No sleep medicine administered.Marland Kitchen  SLEEP STUDY TECHNIQUE The patient underwent an attended overnight polysomnography titration to assess the effects of cpap therapy. The following variables were monitored: EEG (C4-A1, C3-A2, O1-A2, O2-A1, F3-M2, F4-M1), EOG, submental and leg EMG, ECG, oxyhemoglobin saturation by pulse oximetry, thoracic and abdominal respiratory effort belts, nasal/oral airflow by pressure sensor, body position sensor and snoring sensor. CPAP pressure was titrated to eliminate apneas, hypopneas and oxygen desaturation.  TECHNICAL COMMENTS Comments added by Technician: None Comments added by Scorer: N/A  SLEEP ARCHITECTURE The study was initiated at 9:37:48 PM and terminated at 5:19:17 AM. The total recorded time was 461.5 minutes. EEG confirmed total sleep time was 295.1 minutes yielding a sleep efficiency of 63.9%%. Sleep onset after lights out was 97.4 minutes with a REM latency of 176.5 minutes. The patient spent 3.9%% of the night in stage N1 sleep, 87.8%% in stage N2 sleep, 0.0%% in stage N3 and 8.3% in REM. Wake after sleep onset (WASO) was 69.0 minutes. The Arousal Index was 3.5/hour.  RESPIRATORY PARAMETERS The patient was started on BPAP 8/4 and this was increased to resolve  obstructives. However, centrals emerged. BPAP ST was also attempted, but then he was changed to ASV which worked very well. The most appropriate setting of ASV was EPAP Min 5 and Max 5 cmH2O, Pressure Support Min 3 and Max 15 cmH2O with Max Pressure of 25 cmH2O and Breath Rate of 0 BrPM.  LEG MOVEMENT DATA The periodic limb movement index was 0.0/hour with an associated arousal index of /hour.  CARDIAC DATA The underlying cardiac rhythm was most consistent with sinus rhythm. Mean heart rate during sleep was 57.3 bpm. Additional rhythm abnormalities include Atrial Fibrillation.  IMPRESSIONS - Obstructive Sleep apnea (OSA) . - Central Sleep Apnea (CSA) - No significant periodic leg movements(PLMs) during sleep. - The patient reported that his sleep was better in this study than it usually was at home.  DIAGNOSIS - Obstructive Sleep Apnea (G47.33)  RECOMMENDATIONS - Trial of ASV Advanced EPAP Min 5 and Max 8 cmH2O, Pressure Support Min 3 and Max 15 cmH2O with Max Pressure of 23 cmH2O and Backup Breath Rate of 10 BrPM  Marius Ditch Sleep specialist, American Board of Internal Medicine  ELECTRONICALLY SIGNED ON:  08/08/2020, 7:47 PM [place] PH: [phone]   FX: [phone] [place]

## 2020-08-20 DIAGNOSIS — Z23 Encounter for immunization: Secondary | ICD-10-CM | POA: Diagnosis not present

## 2020-08-24 ENCOUNTER — Other Ambulatory Visit: Payer: Self-pay

## 2020-08-24 ENCOUNTER — Other Ambulatory Visit (HOSPITAL_COMMUNITY): Payer: Self-pay | Admitting: Physician Assistant

## 2020-08-24 MED ORDER — RIVAROXABAN 20 MG PO TABS: ORAL_TABLET | ORAL | 1 refills | Status: DC

## 2020-08-24 NOTE — Telephone Encounter (Signed)
Prescription refill request for Xarelto received.  Indication:afib Last office visit:07/25/20 Weight:122kg Age:69 Scr:1.03 on 08/08/20 CrCl:117

## 2020-09-18 ENCOUNTER — Other Ambulatory Visit: Payer: Medicare Other | Admitting: *Deleted

## 2020-09-18 ENCOUNTER — Other Ambulatory Visit: Payer: Self-pay

## 2020-09-18 DIAGNOSIS — E875 Hyperkalemia: Secondary | ICD-10-CM | POA: Diagnosis not present

## 2020-09-18 DIAGNOSIS — E782 Mixed hyperlipidemia: Secondary | ICD-10-CM | POA: Diagnosis not present

## 2020-09-18 LAB — LIPID PANEL
Chol/HDL Ratio: 3.3 ratio (ref 0.0–5.0)
Cholesterol, Total: 147 mg/dL (ref 100–199)
HDL: 45 mg/dL (ref >39)
LDL Chol Calc (NIH): 76 mg/dL (ref 0–99)
Triglycerides: 152 mg/dL — ABNORMAL HIGH (ref 0–149)
VLDL Cholesterol Cal: 26 mg/dL (ref 5–40)

## 2020-09-18 LAB — HEPATIC FUNCTION PANEL
ALT: 21 IU/L (ref 0–44)
AST: 26 IU/L (ref 0–40)
Albumin: 4.1 g/dL (ref 3.8–4.8)
Alkaline Phosphatase: 91 IU/L (ref 44–121)
Bilirubin Total: 1.1 mg/dL (ref 0.0–1.2)
Bilirubin, Direct: 0.33 mg/dL (ref 0.00–0.40)
Total Protein: 6.7 g/dL (ref 6.0–8.5)

## 2020-11-21 ENCOUNTER — Other Ambulatory Visit (HOSPITAL_COMMUNITY): Payer: Self-pay | Admitting: Internal Medicine

## 2021-01-11 ENCOUNTER — Other Ambulatory Visit: Payer: Self-pay | Admitting: Family Medicine

## 2021-01-11 DIAGNOSIS — E119 Type 2 diabetes mellitus without complications: Secondary | ICD-10-CM

## 2021-01-17 ENCOUNTER — Other Ambulatory Visit: Payer: Self-pay | Admitting: Family Medicine

## 2021-01-17 DIAGNOSIS — I10 Essential (primary) hypertension: Secondary | ICD-10-CM

## 2021-01-24 ENCOUNTER — Other Ambulatory Visit: Payer: Self-pay | Admitting: Family Medicine

## 2021-01-24 DIAGNOSIS — M1A0710 Idiopathic chronic gout, right ankle and foot, without tophus (tophi): Secondary | ICD-10-CM

## 2021-01-26 NOTE — Progress Notes (Addendum)
[facility] [place], [place], [place] [address] [phone]  Date:  01/30/2021   Name:  Arthur Rich   DOB:  13-Jul-1951   MRN:  [id number]  PCP:  [name], MD    Chief Complaint: Annual Exam   History of Present Illness:  Arthur Rich is a 70 y.o. very pleasant male patient who presents with the following:  Here today for a CPE- Medicare  Last seen by myself about one year ago- history of hypertension, CAD, atrial fib, diabetes, hyperlipidemia, sleep apnea, gout, TIA   Uses CPAP for his OSA as well as he can Foot exam Eye exam- this is due, reminded pt  Due for labs today- lipids updated by cardiology in November  cologuard last year covid series- recommended 4th dose  shingrix - encouraged him to do this   Seen by pulmonology in October for OSA follow-up/ sleep study.  He is still working on getting his CPAP figured out, he has a new machine and plans to start using it   He has been spending some time with his mom who lives in [place]- she is doing better   He notes that he is generally "always in atrial fib" but it does not bother him  No complications from the blood thinner - no bleeding Encouraged him to get back to his walking routine   He has noted some right sided posterior rib pain for about 8 weeks - NKI This seems to be getting better but he would like to check on this Quit smoking in the 90s  Visit with cardiology also in October: [place]: 1. Persistent AF - managed with rate control and continued anticoagulation. HR is fine by my exam. Would continue with his current regimen.  2. HTN - recheck by me is 130/90 in both arms - he is to monitor at home - send [place] a message in about 2 weeks with an update - could increase CCB if needed - for now, I have left him on his current regimen.  3. Obesity - encouragement given.  4. OSA - unfortunately, he is intolerant to CPAP.  5.  HLD - on statin - lab today.  6. CAD - remote PCI - no worrisome symptoms - needs aggressive CV risk factor modification which is a struggle for him.   Patient Active Problem List   Diagnosis Date Noted  . Abnormal CT of the chest 07/12/2018  . Hemoptysis 07/12/2018  . History of tobacco use 07/12/2018  . Controlled type 2 diabetes mellitus without complication, without long-term current use of insulin ([place]) 11/28/2016  . Obesity (BMI 30-39.9) 09/28/2015  . Vertigo 07/06/2015  . OSA (obstructive sleep apnea) 04/10/2015  . Excessive daytime sleepiness 04/10/2015  . Permanent atrial fibrillation ([place]) 10/25/2014  . Anticoagulation adequate with anticoagulant therapy 07/20/2013  . SYNCOPE 05/01/2010  . Mixed hyperlipidemia 04/30/2010  . ESSENTIAL HYPERTENSION, BENIGN 04/30/2010  . Coronary artery disease involving native coronary artery of native heart without angina pectoris 04/30/2010  . ATRIAL FLUTTER 04/30/2010    Past Medical History:  Diagnosis Date  . Atrial flutter ([place])   . Cancer ([place])    skin  . Coronary artery disease involving native coronary artery of native heart without angina pectoris 04/30/2010   LHC 9/09:  S/p BMS to pLAD // Myoview 4/18: No ischemia, low risk, not gated  . Hyperlipidemia   . Hypertension   .  Obesity (BMI 30-39.9) 09/28/2015  . Overweight(278.02)   . Permanent atrial fibrillation ([place]) 10/25/2014   Echo 10/14: EF 55-65%, mild MR, mod LAE, PASP 31 mmHg // Holter 9/16:  AFib, avg HR 70, rare HR up to 150, rare brady with 35 bpm and 3 sec pause during HS // Echo 4/18: EF 55-60, no RWMA, mild LAE, moderate RAE, trivial TR, PASP 28  . Syncope and collapse 03/29/2010  . TIA (transient ischemic attack)     Past Surgical History:  Procedure Laterality Date  . CARDIAC CATHETERIZATION  9/09   PCI LAD (BMS)  . nasal septal surgery    . TONSILLECTOMY    . WISDOM TOOTH EXTRACTION      Social History   Tobacco Use  . Smoking status: Former Smoker     Packs/day: 1.00    Years: 30.00    Pack years: 30.00    Types: Cigarettes    Quit date: 08/26/1996    Years since quitting: 24.4  . Smokeless tobacco: Never Used  Substance Use Topics  . Alcohol use: Yes    Alcohol/week: 2.0 standard drinks    Types: 2 Cans of beer per week    Comment: previously heavy, now trying to cut back  . Drug use: No    Family History  Problem Relation Age of Onset  . Diabetes Father   . Heart disease Father   . Heart attack Maternal Uncle        early 89's  . Cancer Maternal Grandmother        unknown ?leukemia    Allergies  Allergen Reactions  . Penicillins Rash    Has patient had a PCN reaction causing immediate rash, facial/tongue/throat swelling, SOB or lightheadedness with hypotension: Yes Has patient had a PCN reaction causing severe rash involving mucus membranes or skin necrosis: No Has patient had a PCN reaction that required hospitalization: No Has patient had a PCN reaction occurring within the last 10 years: No If all of the above answers are "NO", then may proceed with Cephalosporin use.    Medication list has been reviewed and updated.  Current Outpatient Medications on File Prior to Visit  Medication Sig Dispense Refill  . acetaminophen (TYLENOL) 500 MG tablet Take 500 mg by mouth every 6 (six) hours as needed for headache (pain).    Marland Kitchen allopurinol (ZYLOPRIM) 100 MG tablet TAKE 1 TABLET BY MOUTH EVERY DAY 90 tablet 3  . atorvastatin (LIPITOR) 80 MG tablet Take 1 tablet (80 mg total) by mouth daily. 90 tablet 3  . Cholecalciferol (VITAMIN D) 50 MCG (2000 UT) CAPS Take 2,000 Units by mouth daily.     Marland Kitchen diltiazem (CARDIZEM CD) 180 MG 24 hr capsule Take 1 capsule (180 mg total) by mouth daily. 90 capsule 2  . diltiazem (CARDIZEM) 30 MG tablet Take 1 tablet (30 mg total) by mouth 4 (four) times daily. 30 tablet 6  . lisinopril (ZESTRIL) 40 MG tablet TAKE 1 TABLET BY MOUTH EVERY DAY 90 tablet 3  . metFORMIN (GLUCOPHAGE) 500 MG tablet  TAKE 1 TABLET (500 MG TOTAL) BY MOUTH DAILY WITH SUPPER. 30 tablet 0  . metoprolol succinate (TOPROL-XL) 100 MG 24 hr tablet TAKE 1 TABLET IN THE AM AND 1/2 TABLET IN THE PM 135 tablet 5  . Polyethyl Glycol-Propyl Glycol (SYSTANE OP) Place 1 drop into both eyes daily as needed (dry eyes).     . rivaroxaban (XARELTO) 20 MG TABS tablet TAKE 1 TABLET (20 MG TOTAL) BY MOUTH  DAILY WITH SUPPER. 90 tablet 1  . ezetimibe (ZETIA) 10 MG tablet Take 1 tablet (10 mg total) by mouth daily. 90 tablet 3   No current facility-administered medications on file prior to visit.    Review of Systems:  As per HPI- otherwise negative.   Physical Examination: Vitals:   01/30/21 0822  BP: 124/70  Pulse: 71  Resp: 17  SpO2: 98%   Vitals:   01/30/21 0822  Weight: 271 lb (122.9 kg)  Height: 6\' 2"  (1.88 m)   Body mass index is 34.79 kg/m. Ideal Body Weight: Weight in (lb) to have BMI = 25: 194.3  GEN: no acute distress. Obese, tall build HEENT: Atraumatic, Normocephalic.   Bilateral TM wnl, oropharynx normal.  PEERL,EOMI.   Ears and Nose: No external deformity. CV: RRR, No M/G/R. No JVD. No thrill. No extra heart sounds. PULM: CTA B, no wheezes, crackles, rhonchi. No retractions. No resp. distress. No accessory muscle use.  ABD: S, NT, ND, +BS. No rebound. No HSM. Umbilical hernia is stable and non- tender  EXTR: No c/c/e   PSYCH: Normally interactive. Conversant.  Foot exam: completed today  I cannot reproduce the tenderness over his right posterior ribs/ scapula  Wt Readings from Last 3 Encounters:  01/30/21 271 lb (122.9 kg)  08/05/20 270 lb (122.5 kg)  07/25/20 276 lb 12.8 oz (125.6 kg)    Assessment and Plan: Permanent atrial fibrillation ([place])  Controlled type 2 diabetes mellitus without complication, without long-term current use of insulin ([place]) - Plan: Hemoglobin A1c  Coronary artery disease involving native coronary artery of native heart without angina pectoris  Screening for  prostate cancer - Plan: PSA, Medicare ( [place] only)  Essential hypertension - Plan: Basic metabolic panel, CBC  Mixed hyperlipidemia - Plan: Lipid panel  Rib pain on right side - Plan: DG Chest 2 View, DG Ribs Unilateral Right  Following up today Added zetia about 6 months ago, check lipids  Labs and films pending as above Encouraged needed immunizations and regular exercise Will plan further follow- up pending labs.  This visit occurred during the SARS-CoV-2 public health emergency.  Safety protocols were in place, including screening questions prior to the visit, additional usage of staff PPE, and extensive cleaning of exam room while observing appropriate contact time as indicated for disinfecting solutions.    Signed Lamar Blinks, MD   Received his labs as below, message to patient Results for orders placed or performed in visit on 58/85/02  Basic metabolic panel  Result Value Ref Range   Sodium 138 135 - 145 mEq/L   Potassium 4.9 3.5 - 5.1 mEq/L   Chloride 103 96 - 112 mEq/L   CO2 27 19 - 32 mEq/L   Glucose, Bld 123 (H) 70 - 99 mg/dL   BUN 16 6 - 23 mg/dL   Creatinine, Ser 1.02 0.40 - 1.50 mg/dL   GFR 75.01 >60.00 mL/min   Calcium 9.7 8.4 - 10.5 mg/dL  CBC  Result Value Ref Range   WBC 6.2 4.0 - 10.5 K/uL   RBC 4.67 4.22 - 5.81 Mil/uL   Platelets 195.0 150.0 - 400.0 K/uL   Hemoglobin 14.4 13.0 - 17.0 g/dL   HCT 42.3 39.0 - 52.0 %   MCV 90.5 78.0 - 100.0 fl   MCHC 34.0 30.0 - 36.0 g/dL   RDW 14.4 11.5 - 15.5 %  Hemoglobin A1c  Result Value Ref Range   Hgb A1c MFr Bld 6.6 (H) 4.6 - 6.5 %  Lipid panel  Result Value Ref Range   Cholesterol 114 0 - 200 mg/dL   Triglycerides 185.0 (H) 0.0 - 149.0 mg/dL   HDL 43.10 >39.00 mg/dL   VLDL 37.0 0.0 - 40.0 mg/dL   LDL Cholesterol 34 0 - 99 mg/dL   Total CHOL/HDL Ratio 3    NonHDL 71.17   PSA, Medicare ( [place] only)  Result Value Ref Range   PSA 0.50 0.10 - 4.00 ng/ml

## 2021-01-26 NOTE — Patient Instructions (Addendum)
Good to see you again today! I will be in touch with your labs Please consider getting the shingles vaccine at your pharmacy at your convenience, as well as the 4th dose of covid Work on diet and exercise- try to get back into your walking routine Please stop by radiology on the way out today to get your x-rays    Health Maintenance After Age 70 After age 43, you are at a higher risk for certain long-term diseases and infections as well as injuries from falls. Falls are a major cause of broken bones and head injuries in people who are older than age 54. Getting regular preventive care can help to keep you healthy and well. Preventive care includes getting regular testing and making lifestyle changes as recommended by your health care provider. Talk with your health care provider about:  Which screenings and tests you should have. A screening is a test that checks for a disease when you have no symptoms.  A diet and exercise plan that is right for you. What should I know about screenings and tests to prevent falls? Screening and testing are the best ways to find a health problem early. Early diagnosis and treatment give you the best chance of managing medical conditions that are common after age 40. Certain conditions and lifestyle choices may make you more likely to have a fall. Your health care provider may recommend:  Regular vision checks. Poor vision and conditions such as cataracts can make you more likely to have a fall. If you wear glasses, make sure to get your prescription updated if your vision changes.  Medicine review. Work with your health care provider to regularly review all of the medicines you are taking, including over-the-counter medicines. Ask your health care provider about any side effects that may make you more likely to have a fall. Tell your health care provider if any medicines that you take make you feel dizzy or sleepy.  Osteoporosis screening. Osteoporosis is a  condition that causes the bones to get weaker. This can make the bones weak and cause them to break more easily.  Blood pressure screening. Blood pressure changes and medicines to control blood pressure can make you feel dizzy.  Strength and balance checks. Your health care provider may recommend certain tests to check your strength and balance while standing, walking, or changing positions.  Foot health exam. Foot pain and numbness, as well as not wearing proper footwear, can make you more likely to have a fall.  Depression screening. You may be more likely to have a fall if you have a fear of falling, feel emotionally low, or feel unable to do activities that you used to do.  Alcohol use screening. Using too much alcohol can affect your balance and may make you more likely to have a fall. What actions can I take to lower my risk of falls? General instructions  Talk with your health care provider about your risks for falling. Tell your health care provider if: ? You fall. Be sure to tell your health care provider about all falls, even ones that seem minor. ? You feel dizzy, sleepy, or off-balance.  Take over-the-counter and prescription medicines only as told by your health care provider. These include any supplements.  Eat a healthy diet and maintain a healthy weight. A healthy diet includes low-fat dairy products, low-fat (lean) meats, and fiber from whole grains, beans, and lots of fruits and vegetables. Home safety  Remove any tripping hazards, such as  rugs, cords, and clutter.  Install safety equipment such as grab bars in bathrooms and safety rails on stairs.  Keep rooms and walkways well-lit. Activity  Follow a regular exercise program to stay fit. This will help you maintain your balance. Ask your health care provider what types of exercise are appropriate for you.  If you need a cane or walker, use it as recommended by your health care provider.  Wear supportive shoes that  have nonskid soles.   Lifestyle  Do not drink alcohol if your health care provider tells you not to drink.  If you drink alcohol, limit how much you have: ? 0-1 drink a day for women. ? 0-2 drinks a day for men.  Be aware of how much alcohol is in your drink. In the [place] one drink equals one typical bottle of beer (12 oz), one-half glass of wine (5 oz), or one shot of hard liquor (1 oz).  Do not use any products that contain nicotine or tobacco, such as cigarettes and e-cigarettes. If you need help quitting, ask your health care provider. Summary  Having a healthy lifestyle and getting preventive care can help to protect your health and wellness after age 57.  Screening and testing are the best way to find a health problem early and help you avoid having a fall. Early diagnosis and treatment give you the best chance for managing medical conditions that are more common for people who are older than age 23.  Falls are a major cause of broken bones and head injuries in people who are older than age 44. Take precautions to prevent a fall at home.  Work with your health care provider to learn what changes you can make to improve your health and wellness and to prevent falls. This information is not intended to replace advice given to you by your health care provider. Make sure you discuss any questions you have with your health care provider. Document Revised: 01/27/2019 Document Reviewed: 08/19/2017 Elsevier Patient Education  2021 [employer].

## 2021-01-30 ENCOUNTER — Encounter: Payer: Self-pay | Admitting: Family Medicine

## 2021-01-30 ENCOUNTER — Other Ambulatory Visit: Payer: Self-pay

## 2021-01-30 ENCOUNTER — Ambulatory Visit (INDEPENDENT_AMBULATORY_CARE_PROVIDER_SITE_OTHER): Payer: Medicare Other | Admitting: Family Medicine

## 2021-01-30 ENCOUNTER — Ambulatory Visit (HOSPITAL_BASED_OUTPATIENT_CLINIC_OR_DEPARTMENT_OTHER)
Admission: RE | Admit: 2021-01-30 | Discharge: 2021-01-30 | Disposition: A | Discharge status: Home / Self Care | Payer: Medicare Other | Source: Ambulatory Visit | Attending: Family Medicine | Admitting: Family Medicine

## 2021-01-30 VITALS — BP 124/70 | HR 71 | Resp 17 | Ht 74.0 in | Wt 271.0 lb

## 2021-01-30 DIAGNOSIS — Z125 Encounter for screening for malignant neoplasm of prostate: Secondary | ICD-10-CM | POA: Diagnosis not present

## 2021-01-30 DIAGNOSIS — R0781 Pleurodynia: Secondary | ICD-10-CM

## 2021-01-30 DIAGNOSIS — I4821 Permanent atrial fibrillation: Secondary | ICD-10-CM

## 2021-01-30 DIAGNOSIS — E782 Mixed hyperlipidemia: Secondary | ICD-10-CM | POA: Diagnosis not present

## 2021-01-30 DIAGNOSIS — I10 Essential (primary) hypertension: Secondary | ICD-10-CM

## 2021-01-30 DIAGNOSIS — I2510 Atherosclerotic heart disease of native coronary artery without angina pectoris: Secondary | ICD-10-CM

## 2021-01-30 DIAGNOSIS — E119 Type 2 diabetes mellitus without complications: Secondary | ICD-10-CM

## 2021-01-30 LAB — CBC
HCT: 42.3 % (ref 39.0–52.0)
Hemoglobin: 14.4 g/dL (ref 13.0–17.0)
MCHC: 34 g/dL (ref 30.0–36.0)
MCV: 90.5 fl (ref 78.0–100.0)
Platelets: 195 10*3/uL (ref 150.0–400.0)
RBC: 4.67 Mil/uL (ref 4.22–5.81)
RDW: 14.4 % (ref 11.5–15.5)
WBC: 6.2 10*3/uL (ref 4.0–10.5)

## 2021-01-30 LAB — LIPID PANEL
Cholesterol: 114 mg/dL (ref 0–200)
HDL: 43.1 mg/dL (ref >39.00)
LDL Cholesterol: 34 mg/dL (ref 0–99)
NonHDL: 71.17
Total CHOL/HDL Ratio: 3
Triglycerides: 185 mg/dL — ABNORMAL HIGH (ref 0.0–149.0)
VLDL: 37 mg/dL (ref 0.0–40.0)

## 2021-01-30 LAB — BASIC METABOLIC PANEL
BUN: 16 mg/dL (ref 6–23)
CO2: 27 mEq/L (ref 19–32)
Calcium: 9.7 mg/dL (ref 8.4–10.5)
Chloride: 103 mEq/L (ref 96–112)
Creatinine, Ser: 1.02 mg/dL (ref 0.40–1.50)
GFR: 75.01 mL/min (ref >60.00)
Glucose, Bld: 123 mg/dL — ABNORMAL HIGH (ref 70–99)
Potassium: 4.9 mEq/L (ref 3.5–5.1)
Sodium: 138 mEq/L (ref 135–145)

## 2021-01-30 LAB — PSA, MEDICARE: PSA: 0.5 ng/ml (ref 0.10–4.00)

## 2021-01-30 LAB — HEMOGLOBIN A1C: Hgb A1c MFr Bld: 6.6 % — ABNORMAL HIGH (ref 4.6–6.5)

## 2021-01-31 ENCOUNTER — Encounter: Payer: Self-pay | Admitting: Family Medicine

## 2021-02-04 ENCOUNTER — Other Ambulatory Visit: Payer: Self-pay | Admitting: Family Medicine

## 2021-02-04 DIAGNOSIS — E119 Type 2 diabetes mellitus without complications: Secondary | ICD-10-CM

## 2021-02-05 ENCOUNTER — Other Ambulatory Visit: Payer: Self-pay | Admitting: Interventional Cardiology

## 2021-02-05 ENCOUNTER — Other Ambulatory Visit: Payer: Self-pay | Admitting: Family Medicine

## 2021-02-05 DIAGNOSIS — E782 Mixed hyperlipidemia: Secondary | ICD-10-CM

## 2021-02-05 NOTE — Telephone Encounter (Signed)
Xarelto 20mg  refill request received. Pt is 70 years old, weight-122.9kg, Crea-1.02 on 01/30/2021, last seen by Truitt Merle on 07/25/2020, Diagnosis-Afib, CrCl-118.82ml/min; Dose is appropriate based on dosing criteria. Will send in refill to requested pharmacy.

## 2021-04-18 ENCOUNTER — Telehealth: Payer: Self-pay | Admitting: Family Medicine

## 2021-04-18 NOTE — Telephone Encounter (Signed)
Copied from [place] [id number]. Topic: Medicare AWV >> Apr 18, 2021 11:12 AM Harris-Coley, Hannah Beat wrote: Reason for CRM: Left message for patient to schedule Annual Wellness Visit.  Please schedule with Health Nurse Advisor Augustine Radar. at [facility].

## 2021-05-23 ENCOUNTER — Telehealth: Payer: Self-pay | Admitting: Internal Medicine

## 2021-05-23 NOTE — Telephone Encounter (Signed)
Spoke with the patient who states that he has been having more frequent episodes of chest pressure/burning. He states that he has had these episodes for many years but over the last couple weeks they have increased in frequency and duration. He states that they used to come on several times a week and only last a few seconds. Now they are occurring more regularly, almost daily. He denies any other symptoms such as SOB, N/V, dizziness/lightheadedness. He has not been having any palpitations and states that his HR has been controlled. He has not been monitoring his blood pressure. Patient scheduled to see Oda Kilts, PA_C on 8/16. Patient aware of ER precautions.

## 2021-05-23 NOTE — Telephone Encounter (Signed)
Pt c/o of Chest Pain: STAT if CP now or developed within 24 hours  1. Are you having CP right now? Experience it earlier this morning  2. Are you experiencing any other symptoms (ex. SOB, nausea, vomiting, sweating)? no  3. How long have you been experiencing CP? Past week  4. Is your CP continuous or coming and going? Coming and going  5. Have you taken Nitroglycerin? No  Patient was looking to come in this week had him schedule for tomorrow with Chalmers Cater but it was a double book. Patient dont feel comforable with waiting till the 16th to be seen. He explain that he feels tightness in chest along with discomfort. He stats that some time they last long and others it doesn't. Please advise ?

## 2021-05-24 ENCOUNTER — Ambulatory Visit: Payer: Medicare Other | Admitting: Student

## 2021-05-25 NOTE — Telephone Encounter (Signed)
Previously followed by Dr [name] but has not seen him personally since 2016.  He has mostly seen Dr Darliss Ridgel APPs.  He should reestablish with Dr Darliss Ridgel team after visit with Oda Kilts for routine cardiology care.

## 2021-06-02 NOTE — Progress Notes (Signed)
PCP:  [name], MD Primary Cardiologist: None Electrophysiologist: Thompson Grayer, MD   Arthur Rich is a 70 y.o. male seen today for Thompson Grayer, MD for acute visit due to chest pressure .  Since last being seen in our clinic the patient reports doing about the same.  Intermittently for years he has had a sharp stabbing pain on his left chest, like a needle or burning. Rates it 2-4 out of 10 on pain scale. Lasts briefly, 1-3 seconds, then subsides completely. They often come in pairs, where he will have several within a short period. He had them frequently on 8/4, none on 8/5 or 8/6. Once on 8/7 then more on 8/14 and 8/15. No specific aggravating or relieving factors. No syncope. No N/V.   Past Medical History:  Diagnosis Date   Atrial flutter ([place])    Cancer ([place])    skin   Coronary artery disease involving native coronary artery of native heart without angina pectoris 04/30/2010   LHC 9/09:  S/p BMS to pLAD // Myoview 4/18: No ischemia, low risk, not gated   Hyperlipidemia    Hypertension    Obesity (BMI 30-39.9) 09/28/2015   Overweight(278.02)    Permanent atrial fibrillation ([place]) 10/25/2014   Echo 10/14: EF 55-65%, mild MR, mod LAE, PASP 31 mmHg // Holter 9/16:  AFib, avg HR 70, rare HR up to 150, rare brady with 35 bpm and 3 sec pause during HS // Echo 4/18: EF 55-60, no RWMA, mild LAE, moderate RAE, trivial TR, PASP 28   Syncope and collapse 03/29/2010   TIA (transient ischemic attack)    Past Surgical History:  Procedure Laterality Date   CARDIAC CATHETERIZATION  9/09   PCI LAD (BMS)   nasal septal surgery     TONSILLECTOMY     WISDOM TOOTH EXTRACTION      Current Outpatient Medications  Medication Sig Dispense Refill   acetaminophen (TYLENOL) 500 MG tablet Take 500 mg by mouth every 6 (six) hours as needed for headache (pain).     allopurinol (ZYLOPRIM) 100 MG tablet TAKE 1 TABLET BY MOUTH EVERY DAY 90 tablet 3   atorvastatin (LIPITOR) 80 MG tablet Take  1 tablet (80 mg total) by mouth daily. 90 tablet 1   Cholecalciferol (VITAMIN D) 50 MCG (2000 UT) CAPS Take 2,000 Units by mouth daily.      diltiazem (CARDIZEM CD) 180 MG 24 hr capsule Take 1 capsule (180 mg total) by mouth daily. 90 capsule 2   diltiazem (CARDIZEM) 30 MG tablet Take 1 tablet (30 mg total) by mouth 4 (four) times daily. 30 tablet 6   lisinopril (ZESTRIL) 40 MG tablet TAKE 1 TABLET BY MOUTH EVERY DAY 90 tablet 3   metFORMIN (GLUCOPHAGE) 500 MG tablet Take 1 tablet (500 mg total) by mouth daily with supper. 90 tablet 1   metoprolol succinate (TOPROL-XL) 100 MG 24 hr tablet TAKE 1 TABLET IN THE AM AND 1/2 TABLET IN THE PM 135 tablet 5   Polyethyl Glycol-Propyl Glycol (SYSTANE OP) Place 1 drop into both eyes daily as needed (dry eyes).      XARELTO 20 MG TABS tablet TAKE 1 TABLET BY MOUTH DAILY WITH SUPPER. 90 tablet 1   ezetimibe (ZETIA) 10 MG tablet Take 1 tablet (10 mg total) by mouth daily. 90 tablet 3   No current facility-administered medications for this visit.    Allergies  Allergen Reactions   Penicillins Rash    Has patient had a  PCN reaction causing immediate rash, facial/tongue/throat swelling, SOB or lightheadedness with hypotension: Yes Has patient had a PCN reaction causing severe rash involving mucus membranes or skin necrosis: No Has patient had a PCN reaction that required hospitalization: No Has patient had a PCN reaction occurring within the last 10 years: No If all of the above answers are "NO", then may proceed with Cephalosporin use.    Social History   Socioeconomic History   Marital status: Single    Spouse name: Not on file   Number of children: Not on file   Years of education: Not on file   Highest education level: Not on file  Occupational History   Not on file  Tobacco Use   Smoking status: Former    Packs/day: 1.00    Years: 30.00    Pack years: 30.00    Types: Cigarettes    Quit date: 08/26/1996    Years since quitting: 24.7    Smokeless tobacco: Never  Substance and Sexual Activity   Alcohol use: Yes    Alcohol/week: 2.0 standard drinks    Types: 2 Cans of beer per week    Comment: previously heavy, now trying to cut back   Drug use: No   Sexual activity: Not on file  Other Topics Concern   Not on file  Social History Narrative   Not on file   Social Determinants of Health   Financial Resource Strain: Not on file  Food Insecurity: Not on file  Transportation Needs: Not on file  Physical Activity: Not on file  Stress: Not on file  Social Connections: Not on file  Intimate Partner Violence: Not on file     Review of Systems: General: No chills, fever, night sweats or weight changes  Cardiovascular:  No chest pain, dyspnea on exertion, edema, orthopnea, palpitations, paroxysmal nocturnal dyspnea Dermatological: No rash, lesions or masses Respiratory: No cough, dyspnea Urologic: No hematuria, dysuria Abdominal: No nausea, vomiting, diarrhea, bright red blood per rectum, melena, or hematemesis Neurologic: No visual changes, weakness, changes in mental status All other systems reviewed and are otherwise negative except as noted above.  Physical Exam: Vitals:   06/04/21 0912  BP: 138/86  Pulse: 81  SpO2: 98%  Weight: 273 lb 12.8 oz (124.2 kg)  Height: '6\' 2"'$  (1.88 m)    GEN- The patient is well appearing, alert and oriented x 3 today.   HEENT: normocephalic, atraumatic; sclera clear, conjunctiva pink; hearing intact; oropharynx clear; neck supple, no JVP Lymph- no cervical lymphadenopathy Lungs- Clear to ausculation bilaterally, normal work of breathing.  No wheezes, rales, rhonchi Heart- Regular rate and rhythm, no murmurs, rubs or gallops, PMI not laterally displaced GI- soft, non-tender, non-distended, bowel sounds present, no hepatosplenomegaly Extremities- no clubbing, cyanosis, or edema; DP/PT/radial pulses 2+ bilaterally MS- no significant deformity or atrophy Skin- warm and dry, no rash  or lesion Psych- euthymic mood, full affect Neuro- strength and sensation are intact  EKG is ordered. Personal review of EKG from today shows Rate controlled atrial fibrillation at 64 bpm  Additional studies reviewed include: Previous EP office notes.   Assessment and Plan:  Chest discomfort  Pt had BMS to LAD in 2009 Myoview 01/2017 with no ischemia, low risk, not gated Gated Myoview 09/2018 low risk, normal perfusion. Offered imdur in the event he is having microvascular angina. He declined at this time, wishes to first update Myoview and labs today LDL at goal 01/30/21   2. Permanent AF Rates controlled. Previous monitoring  revealed controlled rates Continue Xarelto for CHA2DS2/VASc of at least 6.  2. Nocturnal bradycardia Noted on previous monitoring.   No symptoms currently to suggest worsening.  RTC for EP follow up 6 months.   Follow up with gen cards on Dr. [name] team in 3-4 weeks (Ideally 1-2 weeks post myoview)  Shirley Friar, PA-C  06/04/21 9:35 AM

## 2021-06-04 ENCOUNTER — Other Ambulatory Visit: Payer: Self-pay

## 2021-06-04 ENCOUNTER — Encounter: Payer: Self-pay | Admitting: Student

## 2021-06-04 ENCOUNTER — Ambulatory Visit (INDEPENDENT_AMBULATORY_CARE_PROVIDER_SITE_OTHER): Payer: Medicare Other | Admitting: Student

## 2021-06-04 VITALS — BP 138/86 | HR 81 | Ht 74.0 in | Wt 273.8 lb

## 2021-06-04 DIAGNOSIS — R002 Palpitations: Secondary | ICD-10-CM | POA: Diagnosis not present

## 2021-06-04 DIAGNOSIS — I4821 Permanent atrial fibrillation: Secondary | ICD-10-CM | POA: Diagnosis not present

## 2021-06-04 DIAGNOSIS — E782 Mixed hyperlipidemia: Secondary | ICD-10-CM

## 2021-06-04 DIAGNOSIS — I2510 Atherosclerotic heart disease of native coronary artery without angina pectoris: Secondary | ICD-10-CM

## 2021-06-04 DIAGNOSIS — I10 Essential (primary) hypertension: Secondary | ICD-10-CM

## 2021-06-04 DIAGNOSIS — R0789 Other chest pain: Secondary | ICD-10-CM

## 2021-06-04 LAB — CBC WITH DIFFERENTIAL/PLATELET
Basophils Absolute: 0.1 10*3/uL (ref 0.0–0.2)
Basos: 1 %
EOS (ABSOLUTE): 0.2 10*3/uL (ref 0.0–0.4)
Eos: 3 %
Hematocrit: 43.2 % (ref 37.5–51.0)
Hemoglobin: 14.6 g/dL (ref 13.0–17.7)
Immature Grans (Abs): 0 10*3/uL (ref 0.0–0.1)
Immature Granulocytes: 1 %
Lymphocytes Absolute: 1.4 10*3/uL (ref 0.7–3.1)
Lymphs: 20 %
MCH: 30.2 pg (ref 26.6–33.0)
MCHC: 33.8 g/dL (ref 31.5–35.7)
MCV: 89 fL (ref 79–97)
Monocytes Absolute: 0.7 10*3/uL (ref 0.1–0.9)
Monocytes: 10 %
Neutrophils Absolute: 4.7 10*3/uL (ref 1.4–7.0)
Neutrophils: 65 %
Platelets: 195 10*3/uL (ref 150–450)
RBC: 4.83 x10E6/uL (ref 4.14–5.80)
RDW: 12.7 % (ref 11.6–15.4)
WBC: 7.2 10*3/uL (ref 3.4–10.8)

## 2021-06-04 LAB — COMPREHENSIVE METABOLIC PANEL
ALT: 16 IU/L (ref 0–44)
AST: 25 IU/L (ref 0–40)
Albumin/Globulin Ratio: 1.9 (ref 1.2–2.2)
Albumin: 4.6 g/dL (ref 3.8–4.8)
Alkaline Phosphatase: 87 IU/L (ref 44–121)
BUN/Creatinine Ratio: 11 (ref 10–24)
BUN: 12 mg/dL (ref 8–27)
Bilirubin Total: 1.5 mg/dL — ABNORMAL HIGH (ref 0.0–1.2)
CO2: 21 mmol/L (ref 20–29)
Calcium: 9.6 mg/dL (ref 8.6–10.2)
Chloride: 103 mmol/L (ref 96–106)
Creatinine, Ser: 1.06 mg/dL (ref 0.76–1.27)
Globulin, Total: 2.4 g/dL (ref 1.5–4.5)
Glucose: 136 mg/dL — ABNORMAL HIGH (ref 65–99)
Potassium: 4.9 mmol/L (ref 3.5–5.2)
Sodium: 140 mmol/L (ref 134–144)
Total Protein: 7 g/dL (ref 6.0–8.5)
eGFR: 76 mL/min/{1.73_m2} (ref >59)

## 2021-06-04 LAB — TSH: TSH: 2.19 u[IU]/mL (ref 0.450–4.500)

## 2021-06-04 NOTE — Patient Instructions (Signed)
Medication Instructions:  Your physician recommends that you continue on your current medications as directed. Please refer to the Current Medication list given to you today.  *If you need a refill on your cardiac medications before your next appointment, please call your pharmacy*   Lab Work: TODAY: CMET, CBC, TSH  If you have labs (blood work) drawn today and your tests are completely normal, you will receive your results only by: [place] (if you have MyChart) OR A paper copy in the mail If you have any lab test that is abnormal or we need to change your treatment, we will call you to review the results.   Testing/Procedures: Your physician has requested that you have a lexiscan myoview. For further information please visit [email or web address]. Please follow instruction sheet, as given.   Follow-Up: At [facility], you and your health needs are our priority.  As part of our continuing mission to provide you with exceptional heart care, we have created designated Provider Care Teams.  These Care Teams include your primary Cardiologist (physician) and Advanced Practice Providers (APPs -  Physician Assistants and Nurse Practitioners) who all work together to provide you with the care you need, when you need it.    Your next appointment:   3-4 weeks with Daneen Schick, MD 6 month(s)  The format for your next appointment:   In Person  Provider:   You may see Thompson Grayer, MD or one of the following Advanced Practice Providers on your designated Care Team:   Tommye Standard, [place] Legrand Como "[facility]" [place], [place]

## 2021-06-12 ENCOUNTER — Telehealth (HOSPITAL_COMMUNITY): Payer: Self-pay

## 2021-06-12 NOTE — Telephone Encounter (Signed)
Detailed instructions left o the patient's answering machine. Asked to call back with any questions. S.Heber Hoog EMTP

## 2021-06-13 ENCOUNTER — Other Ambulatory Visit: Payer: Self-pay

## 2021-06-13 ENCOUNTER — Ambulatory Visit (HOSPITAL_COMMUNITY): Payer: Medicare Other | Attending: Cardiovascular Disease

## 2021-06-13 DIAGNOSIS — I2510 Atherosclerotic heart disease of native coronary artery without angina pectoris: Secondary | ICD-10-CM | POA: Diagnosis not present

## 2021-06-13 DIAGNOSIS — R0789 Other chest pain: Secondary | ICD-10-CM | POA: Insufficient documentation

## 2021-06-13 LAB — MYOCARDIAL PERFUSION IMAGING
Base ST Depression (mm): 0 mm
LV dias vol: 98 mL (ref 62–150)
LV sys vol: 42 mL
Nuc Stress EF: 57 %
Peak HR: 76 {beats}/min
Rest HR: 65 {beats}/min
Rest Nuclear Isotope Dose: 10.9 mCi
SDS: 2
SRS: 0
SSS: 2
ST Depression (mm): 0 mm
Stress Nuclear Isotope Dose: 29.4 mCi
TID: 0.97

## 2021-06-13 MED ORDER — REGADENOSON 0.4 MG/5ML IV SOLN
0.4000 mg | Freq: Once | INTRAVENOUS | Status: AC
  Administered 2021-06-13: 0.4 mg via INTRAVENOUS

## 2021-06-13 MED ORDER — TECHNETIUM TC 99M TETROFOSMIN IV KIT
29.4000 | PACK | Freq: Once | INTRAVENOUS | Status: AC | PRN
  Administered 2021-06-13: 29.4 via INTRAVENOUS
  Filled 2021-06-13: qty 30

## 2021-06-13 MED ORDER — TECHNETIUM TC 99M TETROFOSMIN IV KIT
10.9000 | PACK | Freq: Once | INTRAVENOUS | Status: AC | PRN
  Administered 2021-06-13: 10.9 via INTRAVENOUS
  Filled 2021-06-13: qty 11

## 2021-07-03 ENCOUNTER — Other Ambulatory Visit: Payer: Self-pay

## 2021-07-03 ENCOUNTER — Ambulatory Visit (INDEPENDENT_AMBULATORY_CARE_PROVIDER_SITE_OTHER): Payer: Medicare Other | Admitting: Interventional Cardiology

## 2021-07-03 ENCOUNTER — Encounter: Payer: Self-pay | Admitting: Interventional Cardiology

## 2021-07-03 VITALS — BP 122/78 | HR 67 | Ht 74.0 in | Wt 275.0 lb

## 2021-07-03 DIAGNOSIS — I2510 Atherosclerotic heart disease of native coronary artery without angina pectoris: Secondary | ICD-10-CM

## 2021-07-03 DIAGNOSIS — I4821 Permanent atrial fibrillation: Secondary | ICD-10-CM | POA: Diagnosis not present

## 2021-07-03 DIAGNOSIS — G4733 Obstructive sleep apnea (adult) (pediatric): Secondary | ICD-10-CM | POA: Diagnosis not present

## 2021-07-03 DIAGNOSIS — I483 Typical atrial flutter: Secondary | ICD-10-CM

## 2021-07-03 DIAGNOSIS — E669 Obesity, unspecified: Secondary | ICD-10-CM | POA: Diagnosis not present

## 2021-07-03 DIAGNOSIS — Z7901 Long term (current) use of anticoagulants: Secondary | ICD-10-CM

## 2021-07-03 DIAGNOSIS — E782 Mixed hyperlipidemia: Secondary | ICD-10-CM

## 2021-07-03 DIAGNOSIS — I10 Essential (primary) hypertension: Secondary | ICD-10-CM | POA: Diagnosis not present

## 2021-07-03 NOTE — Patient Instructions (Signed)
Medication Instructions:  Your physician recommends that you continue on your current medications as directed. Please refer to the Current Medication list given to you today.  *If you need a refill on your cardiac medications before your next appointment, please call your pharmacy*   Lab Work: None If you have labs (blood work) drawn today and your tests are completely normal, you will receive your results only by: [place] (if you have MyChart) OR A paper copy in the mail If you have any lab test that is abnormal or we need to change your treatment, we will call you to review the results.   Testing/Procedures: None   Follow-Up: At [facility], you and your health needs are our priority.  As part of our continuing mission to provide you with exceptional heart care, we have created designated Provider Care Teams.  These Care Teams include your primary Cardiologist (physician) and Advanced Practice Providers (APPs -  Physician Assistants and Nurse Practitioners) who all work together to provide you with the care you need, when you need it.  We recommend signing up for the patient portal called "MyChart".  Sign up information is provided on this After Visit Summary.  MyChart is used to connect with patients for Virtual Visits (Telemedicine).  Patients are able to view lab/test results, encounter notes, upcoming appointments, etc.  Non-urgent messages can be sent to your provider as well.   To learn more about what you can do with MyChart, go to [email or web address].    Your next appointment:   9-12 month(s)  The format for your next appointment:   In Person  Provider:   You may see Sinclair Grooms, MD or one of the following Advanced Practice Providers on your designated Care Team:   [name], NP   Other Instructions

## 2021-07-03 NOTE — Progress Notes (Signed)
Cardiology Office Note:    Date:  07/03/2021   ID:  Arthur Rich, DOB 08-12-1951, MRN [id number]  PCP:  [name], MD  Cardiologist:  Sinclair Grooms, MD   Referring MD: [name], MD   Chief Complaint  Patient presents with   Atrial Fibrillation   Coronary Artery Disease    History of Present Illness:    Arthur Rich is a 70 y.o. male with a hx of persistent AF, OSA - not compliant with CPAP, DM, HTN, and CAD with prior BMS to the LAD in 2009. CHADSVASC of at least 6 - he is on Xarelto.    A circular left parasternal region of recurring burning/sticking that lasts seconds and goes away.  Multiple nuclear studies have been done over the last 6 years.  The most recent was done within the past 2 months and was low risk.  Referred back to cardiology from electrophysiology for reassurance concerning coronary disease.  Patient did have LAD stent in 2009, which was bare-metal.  This summer he has been relatively soft sedentary.  He is gaining weight.  He is not wearing CPAP/BiPAP.  He has not needed sublingual nitroglycerin.  Denies orthopnea and PND.  Generally feels he is doing relatively well but was concerned about the chest pain.  This variety of chest pain has been going on for greater than 10 years and never went away after stent implantation.  Past Medical History:  Diagnosis Date   Atrial flutter ([place])    Cancer ([place])    skin   Coronary artery disease involving native coronary artery of native heart without angina pectoris 04/30/2010   LHC 9/09:  S/p BMS to pLAD // Myoview 4/18: No ischemia, low risk, not gated   Hyperlipidemia    Hypertension    Obesity (BMI 30-39.9) 09/28/2015   Overweight(278.02)    Permanent atrial fibrillation ([place]) 10/25/2014   Echo 10/14: EF 55-65%, mild MR, mod LAE, PASP 31 mmHg // Holter 9/16:  AFib, avg HR 70, rare HR up to 150, rare brady with 35 bpm and 3 sec pause during HS // Echo 4/18: EF 55-60, no RWMA, mild LAE,  moderate RAE, trivial TR, PASP 28   Syncope and collapse 03/29/2010   TIA (transient ischemic attack)     Past Surgical History:  Procedure Laterality Date   CARDIAC CATHETERIZATION  9/09   PCI LAD (BMS)   nasal septal surgery     TONSILLECTOMY     WISDOM TOOTH EXTRACTION      Current Medications: Current Meds  Medication Sig   acetaminophen (TYLENOL) 500 MG tablet Take 500 mg by mouth every 6 (six) hours as needed for headache (pain).   allopurinol (ZYLOPRIM) 100 MG tablet TAKE 1 TABLET BY MOUTH EVERY DAY   atorvastatin (LIPITOR) 80 MG tablet Take 1 tablet (80 mg total) by mouth daily.   Cholecalciferol (VITAMIN D) 50 MCG (2000 UT) CAPS Take 2,000 Units by mouth daily.    diltiazem (CARDIZEM CD) 180 MG 24 hr capsule Take 1 capsule (180 mg total) by mouth daily.   diltiazem (CARDIZEM) 30 MG tablet Take 1 tablet (30 mg total) by mouth 4 (four) times daily.   lisinopril (ZESTRIL) 40 MG tablet TAKE 1 TABLET BY MOUTH EVERY DAY   metFORMIN (GLUCOPHAGE) 500 MG tablet Take 1 tablet (500 mg total) by mouth daily with supper.   metoprolol succinate (TOPROL-XL) 100 MG 24 hr tablet TAKE 1 TABLET IN THE AM AND 1/2  TABLET IN THE PM   Polyethyl Glycol-Propyl Glycol (SYSTANE OP) Place 1 drop into both eyes daily as needed (dry eyes).    XARELTO 20 MG TABS tablet TAKE 1 TABLET BY MOUTH DAILY WITH SUPPER.     Allergies:   Penicillins   Social History   Socioeconomic History   Marital status: Single    Spouse name: Not on file   Number of children: Not on file   Years of education: Not on file   Highest education level: Not on file  Occupational History   Not on file  Tobacco Use   Smoking status: Former    Packs/day: 1.00    Years: 30.00    Pack years: 30.00    Types: Cigarettes    Quit date: 08/26/1996    Years since quitting: 24.8   Smokeless tobacco: Never  Substance and Sexual Activity   Alcohol use: Yes    Alcohol/week: 2.0 standard drinks    Types: 2 Cans of beer per week     Comment: previously heavy, now trying to cut back   Drug use: No   Sexual activity: Not on file  Other Topics Concern   Not on file  Social History Narrative   Not on file   Social Determinants of Health   Financial Resource Strain: Not on file  Food Insecurity: Not on file  Transportation Needs: Not on file  Physical Activity: Not on file  Stress: Not on file  Social Connections: Not on file     Family History: The patient's family history includes Cancer in his maternal grandmother; Diabetes in his father; Heart attack in his maternal uncle; Heart disease in his father.  ROS:   Please see the history of present illness.    Face mask was not fitting well.  Dr. Elenore Rota has prescribes BiPAP.  He never started BiPAP.  All other systems reviewed and are negative.  EKGs/Labs/Other Studies Reviewed:    The following studies were reviewed today: Myocardial perfusion imaging 2022, August   The study is normal. The study is low risk.   No ST deviation was noted.   Nuclear stress EF: 57 %. The left ventricular ejection fraction is normal (55-65%). Left ventricular function is normal. End diastolic cavity size is normal.   Low risk stress nuclear study with normal perfusion and normal left ventricular regional and global systolic function.   EKG:  EKG not repeated.  Is in chronic atrial fibrillation.  Recent Labs: 06/04/2021: ALT 16; BUN 12; Creatinine, Ser 1.06; Hemoglobin 14.6; Platelets 195; Potassium 4.9; Sodium 140; TSH 2.190  Recent Lipid Panel    Component Value Date/Time   CHOL 114 01/30/2021 0841   CHOL 147 09/18/2020 0947   TRIG 185.0 (H) 01/30/2021 0841   HDL 43.10 01/30/2021 0841   HDL 45 09/18/2020 0947   CHOLHDL 3 01/30/2021 0841   VLDL 37.0 01/30/2021 0841   LDLCALC 34 01/30/2021 0841   LDLCALC 76 09/18/2020 0947   LDLDIRECT 107.0 11/27/2016 1733    Physical Exam:    VS:  BP 122/78   Pulse 67   Ht '6\' 2"'$  (1.88 m)   Wt 275 lb (124.7 kg)   SpO2 97%   BMI  35.31 kg/m     Wt Readings from Last 3 Encounters:  07/03/21 275 lb (124.7 kg)  06/13/21 273 lb (123.8 kg)  06/04/21 273 lb 12.8 oz (124.2 kg)     GEN: Morbidly obese with BMI 35.. No acute distress HEENT: Normal NECK:  No JVD. LYMPHATICS: No lymphadenopathy CARDIAC: No murmur. IIRR no gallop, or edema. VASCULAR:  Normal Pulses. No bruits. RESPIRATORY:  Clear to auscultation without rales, wheezing or rhonchi  ABDOMEN: Soft, non-tender, non-distended, No pulsatile mass, MUSCULOSKELETAL: No deformity  SKIN: Warm and dry NEUROLOGIC:  Alert and oriented x 3 PSYCHIATRIC:  Normal affect   ASSESSMENT:    1. Coronary artery disease involving native coronary artery of native heart without angina pectoris   2. Permanent atrial fibrillation ([place])   3. Essential hypertension   4. Mixed hyperlipidemia   5. Obstructive sleep apnea   6. Obesity (BMI 30-39.9)   7. Typical atrial flutter ([place])   8. Anticoagulation adequate with anticoagulant therapy    PLAN:    In order of problems listed above:  Stable from cardiac standpoint.  Low risk nuclear.  Secondary prevention discussed.  Really impressed upon him the importance of 150 minutes of moderate activity per week. Continue anticoagulation therapy with Xarelto 20 mg daily.  Watch for bleeding. Blood pressure control is adequate with Zestril, diltiazem, and Toprol-XL. Continue therapy with atorvastatin 80 mg/day.  Most recent LDL 34. He has BiPAP but never started it.  He was strongly encouraged to get started and be in contact with Dr. Elenore Rota if issues. Needs to lose weight.  This was discussed. Not discussed Continue Xarelto.  Needs hemoglobin and creatinine twice yearly.  Most recent creatinine was 1.06 and hemoglobin is now recorded in August 2022.  Overall education and awareness concerning primary/secondary risk prevention was discussed in detail: LDL less than 70, hemoglobin A1c less than 7, blood pressure target less than 130/80  mmHg, >150 minutes of moderate aerobic activity per week, avoidance of smoking, weight control (via diet and exercise), and continued surveillance/management of/for obstructive sleep apnea.    Medication Adjustments/Labs and Tests Ordered: Current medicines are reviewed at length with the patient today.  Concerns regarding medicines are outlined above.  No orders of the defined types were placed in this encounter.  No orders of the defined types were placed in this encounter.   Patient Instructions  Medication Instructions:  Your physician recommends that you continue on your current medications as directed. Please refer to the Current Medication list given to you today.  *If you need a refill on your cardiac medications before your next appointment, please call your pharmacy*   Lab Work: None If you have labs (blood work) drawn today and your tests are completely normal, you will receive your results only by: [place] (if you have MyChart) OR A paper copy in the mail If you have any lab test that is abnormal or we need to change your treatment, we will call you to review the results.   Testing/Procedures: None   Follow-Up: At [facility], you and your health needs are our priority.  As part of our continuing mission to provide you with exceptional heart care, we have created designated Provider Care Teams.  These Care Teams include your primary Cardiologist (physician) and Advanced Practice Providers (APPs -  Physician Assistants and Nurse Practitioners) who all work together to provide you with the care you need, when you need it.  We recommend signing up for the patient portal called "MyChart".  Sign up information is provided on this After Visit Summary.  MyChart is used to connect with patients for Virtual Visits (Telemedicine).  Patients are able to view lab/test results, encounter notes, upcoming appointments, etc.  Non-urgent messages can be sent to your provider as  well.   To learn more about what you can do with MyChart, go to [email or web address].    Your next appointment:   9-12 month(s)  The format for your next appointment:   In Person  Provider:   You may see Sinclair Grooms, MD or one of the following Advanced Practice Providers on your designated Care Team:   [name], NP   Other Instructions     Signed, Sinclair Grooms, MD  07/03/2021 3:36 PM    [place]

## 2021-07-15 ENCOUNTER — Other Ambulatory Visit: Payer: Self-pay | Admitting: *Deleted

## 2021-07-15 MED ORDER — EZETIMIBE 10 MG PO TABS: 10.0000 mg | ORAL_TABLET | Freq: Every day | ORAL | 2 refills | Status: DC

## 2021-07-24 DIAGNOSIS — Z23 Encounter for immunization: Secondary | ICD-10-CM | POA: Diagnosis not present

## 2021-07-31 ENCOUNTER — Other Ambulatory Visit: Payer: Self-pay | Admitting: Family Medicine

## 2021-07-31 DIAGNOSIS — E782 Mixed hyperlipidemia: Secondary | ICD-10-CM

## 2021-07-31 DIAGNOSIS — L820 Inflamed seborrheic keratosis: Secondary | ICD-10-CM | POA: Diagnosis not present

## 2021-07-31 DIAGNOSIS — L821 Other seborrheic keratosis: Secondary | ICD-10-CM | POA: Diagnosis not present

## 2021-07-31 DIAGNOSIS — E119 Type 2 diabetes mellitus without complications: Secondary | ICD-10-CM

## 2021-07-31 DIAGNOSIS — Z23 Encounter for immunization: Secondary | ICD-10-CM | POA: Diagnosis not present

## 2021-07-31 DIAGNOSIS — L570 Actinic keratosis: Secondary | ICD-10-CM | POA: Diagnosis not present

## 2021-08-20 DIAGNOSIS — G4733 Obstructive sleep apnea (adult) (pediatric): Secondary | ICD-10-CM | POA: Diagnosis not present

## 2021-08-21 ENCOUNTER — Other Ambulatory Visit: Payer: Self-pay | Admitting: Interventional Cardiology

## 2021-08-21 NOTE — Telephone Encounter (Signed)
Prescription refill request for Xarelto received.  Indication:Afib   Last office visit: 9/14/2 Arthur Rich)  Weight: 124.7kg Age: 70 Scr:1.06 (06/04/21) CrCl: 114.89ml/min  Appropriate dose and refill sent to requested pharmacy.

## 2021-09-05 ENCOUNTER — Telehealth: Payer: Self-pay | Admitting: Family Medicine

## 2021-09-05 NOTE — Telephone Encounter (Signed)
Left message for patient to call back and schedule Medicare Annual Wellness Visit (AWV) in office.   If not able to come in office, please offer to do virtually or by telephone.  Left office number and my jabber [phone].  Last AWV:01/09/2020  Please schedule at anytime with Nurse Health Advisor.

## 2021-09-10 ENCOUNTER — Other Ambulatory Visit: Payer: Self-pay | Admitting: Physician Assistant

## 2021-09-10 ENCOUNTER — Other Ambulatory Visit: Payer: Self-pay | Admitting: Family Medicine

## 2021-09-10 DIAGNOSIS — E782 Mixed hyperlipidemia: Secondary | ICD-10-CM

## 2021-09-10 DIAGNOSIS — E119 Type 2 diabetes mellitus without complications: Secondary | ICD-10-CM

## 2021-09-17 ENCOUNTER — Other Ambulatory Visit (HOSPITAL_COMMUNITY): Payer: Self-pay | Admitting: Interventional Cardiology

## 2021-10-28 DIAGNOSIS — G4733 Obstructive sleep apnea (adult) (pediatric): Secondary | ICD-10-CM | POA: Diagnosis not present

## 2021-11-05 NOTE — Progress Notes (Signed)
Cardiology Office Note:    Date:  11/06/2021   ID:  Arthur Rich, DOB 04-14-1951, MRN [id number]  PCP:  [name], MD  Cardiologist:  Sinclair Grooms, MD   Referring MD: [name], MD   Chief Complaint  Patient presents with   Coronary Artery Disease   Congestive Heart Failure   Atrial Fibrillation   Follow-up    Sleep apnea    History of Present Illness:    Arthur Rich is a 71 y.o. male with a hx of  persistent AF, OSA - not compliant with CPAP, DM, HTN, and CAD with prior BMS to the LAD in 2009. CHADSVASC of at least 6 - he is on Xarelto.   Arthur Rich is doing well.  He has no complaints.  He does have sleep apnea is now on CPAP.  He has adjusted.  He feels this is brought about a great improvement.  He is still physically sedentary.  We discussed increasing activity as overall protection against early mortality and as a gauge for development of cardiovascular issues.  We discussed the particular monitoring and needs to be done to manage his risk factors.  He needs hemoglobin and creatinine twice yearly because he is on Xarelto.  He needs a lipid panel once a year.  Target LDL less than 70.  Past Medical History:  Diagnosis Date   Atrial flutter ([place])    Cancer ([place])    skin   Coronary artery disease involving native coronary artery of native heart without angina pectoris 04/30/2010   LHC 9/09:  S/p BMS to pLAD // Myoview 4/18: No ischemia, low risk, not gated   Hyperlipidemia    Hypertension    Obesity (BMI 30-39.9) 09/28/2015   Overweight(278.02)    Permanent atrial fibrillation ([place]) 10/25/2014   Echo 10/14: EF 55-65%, mild MR, mod LAE, PASP 31 mmHg // Holter 9/16:  AFib, avg HR 70, rare HR up to 150, rare brady with 35 bpm and 3 sec pause during HS // Echo 4/18: EF 55-60, no RWMA, mild LAE, moderate RAE, trivial TR, PASP 28   Syncope and collapse 03/29/2010   TIA (transient ischemic attack)     Past Surgical History:  Procedure  Laterality Date   CARDIAC CATHETERIZATION  9/09   PCI LAD (BMS)   nasal septal surgery     TONSILLECTOMY     WISDOM TOOTH EXTRACTION      Current Medications: Current Meds  Medication Sig   acetaminophen (TYLENOL) 500 MG tablet Take 500 mg by mouth every 6 (six) hours as needed for headache (pain).   allopurinol (ZYLOPRIM) 100 MG tablet TAKE 1 TABLET BY MOUTH EVERY DAY   atorvastatin (LIPITOR) 80 MG tablet Take 1 tablet (80 mg total) by mouth daily. Requested drug refills are authorized, however, the patient needs further evaluation and/or laboratory testing before further refills are given. Ask him to make an appointment for this.   Cholecalciferol (VITAMIN D) 50 MCG (2000 UT) CAPS Take 2,000 Units by mouth daily.    diltiazem (CARDIZEM CD) 180 MG 24 hr capsule TAKE 1 CAPSULE BY MOUTH EVERY DAY   diltiazem (CARDIZEM) 30 MG tablet Take 1 tablet (30 mg total) by mouth 4 (four) times daily.   lisinopril (ZESTRIL) 40 MG tablet TAKE 1 TABLET BY MOUTH EVERY DAY   metFORMIN (GLUCOPHAGE) 500 MG tablet Take 1 tablet (500 mg total) by mouth daily with supper.   metoprolol succinate (TOPROL-XL) 100 MG 24 hr tablet  TAKE 1 TABLET IN THE AM AND 1/2 TABLET IN THE PM   Polyethyl Glycol-Propyl Glycol (SYSTANE OP) Place 1 drop into both eyes daily as needed (dry eyes).    XARELTO 20 MG TABS tablet TAKE 1 TABLET BY MOUTH DAILY WITH SUPPER     Allergies:   Penicillins   Social History   Socioeconomic History   Marital status: Single    Spouse name: Not on file   Number of children: Not on file   Years of education: Not on file   Highest education level: Not on file  Occupational History   Not on file  Tobacco Use   Smoking status: Former    Packs/day: 1.00    Years: 30.00    Pack years: 30.00    Types: Cigarettes    Quit date: 08/26/1996    Years since quitting: 25.2   Smokeless tobacco: Never  Substance and Sexual Activity   Alcohol use: Yes    Alcohol/week: 2.0 standard drinks    Types:  2 Cans of beer per week    Comment: previously heavy, now trying to cut back   Drug use: No   Sexual activity: Not on file  Other Topics Concern   Not on file  Social History Narrative   Not on file   Social Determinants of Health   Financial Resource Strain: Not on file  Food Insecurity: Not on file  Transportation Needs: Not on file  Physical Activity: Not on file  Stress: Not on file  Social Connections: Not on file     Family History: The patient's family history includes Cancer in his maternal grandmother; Diabetes in his father; Heart attack in his maternal uncle; Heart disease in his father.  ROS:   Please see the history of present illness.    Sleeping well.  No syncope.  No medication side effects.  All other systems reviewed and are negative.  EKGs/Labs/Other Studies Reviewed:    The following studies were reviewed today:  Myocardial Perfusion Imaging 2022 Study Highlights      The study is normal. The study is low risk.   No ST deviation was noted.   Nuclear stress EF: 57 %. The left ventricular ejection fraction is normal (55-65%). Left ventricular function is normal. End diastolic cavity size is normal.   Low risk stress nuclear study with normal perfusion and normal left ventricular regional and global systolic function  EKG:  EKG not performed today  Recent Labs: 06/04/2021: ALT 16; BUN 12; Creatinine, Ser 1.06; Hemoglobin 14.6; Platelets 195; Potassium 4.9; Sodium 140; TSH 2.190  Recent Lipid Panel    Component Value Date/Time   CHOL 114 01/30/2021 0841   CHOL 147 09/18/2020 0947   TRIG 185.0 (H) 01/30/2021 0841   HDL 43.10 01/30/2021 0841   HDL 45 09/18/2020 0947   CHOLHDL 3 01/30/2021 0841   VLDL 37.0 01/30/2021 0841   LDLCALC 34 01/30/2021 0841   LDLCALC 76 09/18/2020 0947   LDLDIRECT 107.0 11/27/2016 1733    Physical Exam:    VS:  BP 110/78    Pulse 61    Ht 6\' 2"  (1.88 m)    Wt 275 lb 6.4 oz (124.9 kg)    SpO2 96%    BMI 35.36 kg/m      Wt Readings from Last 3 Encounters:  11/06/21 275 lb 6.4 oz (124.9 kg)  07/03/21 275 lb (124.7 kg)  06/13/21 273 lb (123.8 kg)     GEN: Obese. No acute distress  HEENT: Normal NECK: No JVD. LYMPHATICS: No lymphadenopathy CARDIAC: No murmur. RRR no gallop, or edema. VASCULAR:  Normal Pulses. No bruits. RESPIRATORY:  Clear to auscultation without rales, wheezing or rhonchi  ABDOMEN: Soft, non-tender, non-distended, No pulsatile mass, MUSCULOSKELETAL: No deformity  SKIN: Warm and dry NEUROLOGIC:  Alert and oriented x 3 PSYCHIATRIC:  Normal affect   ASSESSMENT:    1. Coronary artery disease involving native coronary artery of native heart without angina pectoris   2. Permanent atrial fibrillation ([place])   3. Essential hypertension   4. Mixed hyperlipidemia   5. Obstructive sleep apnea   6. Anticoagulation adequate with anticoagulant therapy    PLAN:    In order of problems listed above:  Secondary prevention reviewed. Atrial fibrillation with controlled rate is noted. Blood pressures well controlled Continue statin and ezetimibe therapy for target LDL less than 70 Noncompliant with BiPAP. Xarelto therapy without complications.  Hemoglobin and creatinine twice yearly.  Overall education and awareness concerning secondary risk prevention was discussed in detail: LDL less than 70, hemoglobin A1c less than 7, blood pressure target less than 130/80 mmHg, >150 minutes of moderate aerobic activity per week, avoidance of smoking, weight control (via diet and exercise), and continued surveillance/management of/for obstructive sleep apnea.    Medication Adjustments/Labs and Tests Ordered: Current medicines are reviewed at length with the patient today.  Concerns regarding medicines are outlined above.  No orders of the defined types were placed in this encounter.  No orders of the defined types were placed in this encounter.   Patient Instructions  Medication Instructions:   Your physician recommends that you continue on your current medications as directed. Please refer to the Current Medication list given to you today.  *If you need a refill on your cardiac medications before your next appointment, please call your pharmacy*   Lab Work: none If you have labs (blood work) drawn today and your tests are completely normal, you will receive your results only by: [place] (if you have MyChart) OR A paper copy in the mail If you have any lab test that is abnormal or we need to change your treatment, we will call you to review the results.   Testing/Procedures: none   Follow-Up: At [facility], you and your health needs are our priority.  As part of our continuing mission to provide you with exceptional heart care, we have created designated Provider Care Teams.  These Care Teams include your primary Cardiologist (physician) and Advanced Practice Providers (APPs -  Physician Assistants and Nurse Practitioners) who all work together to provide you with the care you need, when you need it.  We recommend signing up for the patient portal called "MyChart".  Sign up information is provided on this After Visit Summary.  MyChart is used to connect with patients for Virtual Visits (Telemedicine).  Patients are able to view lab/test results, encounter notes, upcoming appointments, etc.  Non-urgent messages can be sent to your provider as well.   To learn more about what you can do with MyChart, go to [email or web address].    Your next appointment:   1 year(s)  The format for your next appointment:   In Person  Provider:   Sinclair Grooms, MD     Other Instructions     Signed, Sinclair Grooms, MD  11/06/2021 11:40 AM    [place]

## 2021-11-06 ENCOUNTER — Encounter: Payer: Self-pay | Admitting: Interventional Cardiology

## 2021-11-06 ENCOUNTER — Ambulatory Visit (INDEPENDENT_AMBULATORY_CARE_PROVIDER_SITE_OTHER): Payer: Medicare Other | Admitting: Interventional Cardiology

## 2021-11-06 ENCOUNTER — Other Ambulatory Visit: Payer: Self-pay

## 2021-11-06 VITALS — BP 110/78 | HR 61 | Ht 74.0 in | Wt 275.4 lb

## 2021-11-06 DIAGNOSIS — I2510 Atherosclerotic heart disease of native coronary artery without angina pectoris: Secondary | ICD-10-CM | POA: Diagnosis not present

## 2021-11-06 DIAGNOSIS — E782 Mixed hyperlipidemia: Secondary | ICD-10-CM | POA: Diagnosis not present

## 2021-11-06 DIAGNOSIS — I10 Essential (primary) hypertension: Secondary | ICD-10-CM

## 2021-11-06 DIAGNOSIS — Z7901 Long term (current) use of anticoagulants: Secondary | ICD-10-CM | POA: Diagnosis not present

## 2021-11-06 DIAGNOSIS — G4733 Obstructive sleep apnea (adult) (pediatric): Secondary | ICD-10-CM | POA: Diagnosis not present

## 2021-11-06 DIAGNOSIS — I4821 Permanent atrial fibrillation: Secondary | ICD-10-CM | POA: Diagnosis not present

## 2021-11-06 NOTE — Patient Instructions (Signed)
Medication Instructions:  Your physician recommends that you continue on your current medications as directed. Please refer to the Current Medication list given to you today.  *If you need a refill on your cardiac medications before your next appointment, please call your pharmacy*   Lab Work: none If you have labs (blood work) drawn today and your tests are completely normal, you will receive your results only by: [place] (if you have MyChart) OR A paper copy in the mail If you have any lab test that is abnormal or we need to change your treatment, we will call you to review the results.   Testing/Procedures: none   Follow-Up: At [facility], you and your health needs are our priority.  As part of our continuing mission to provide you with exceptional heart care, we have created designated Provider Care Teams.  These Care Teams include your primary Cardiologist (physician) and Advanced Practice Providers (APPs -  Physician Assistants and Nurse Practitioners) who all work together to provide you with the care you need, when you need it.  We recommend signing up for the patient portal called "MyChart".  Sign up information is provided on this After Visit Summary.  MyChart is used to connect with patients for Virtual Visits (Telemedicine).  Patients are able to view lab/test results, encounter notes, upcoming appointments, etc.  Non-urgent messages can be sent to your provider as well.   To learn more about what you can do with MyChart, go to [email or web address].    Your next appointment:   1 year(s)  The format for your next appointment:   In Person  Provider:   Sinclair Grooms, MD     Other Instructions

## 2021-11-16 ENCOUNTER — Other Ambulatory Visit: Payer: Self-pay | Admitting: Family Medicine

## 2021-11-16 DIAGNOSIS — E119 Type 2 diabetes mellitus without complications: Secondary | ICD-10-CM

## 2021-11-16 DIAGNOSIS — E782 Mixed hyperlipidemia: Secondary | ICD-10-CM

## 2021-11-29 ENCOUNTER — Other Ambulatory Visit: Payer: Self-pay | Admitting: Family Medicine

## 2021-11-29 DIAGNOSIS — E119 Type 2 diabetes mellitus without complications: Secondary | ICD-10-CM

## 2021-12-03 ENCOUNTER — Other Ambulatory Visit: Payer: Self-pay | Admitting: Family Medicine

## 2021-12-03 DIAGNOSIS — E119 Type 2 diabetes mellitus without complications: Secondary | ICD-10-CM

## 2021-12-03 DIAGNOSIS — I10 Essential (primary) hypertension: Secondary | ICD-10-CM

## 2021-12-03 DIAGNOSIS — E782 Mixed hyperlipidemia: Secondary | ICD-10-CM

## 2021-12-04 DIAGNOSIS — E119 Type 2 diabetes mellitus without complications: Secondary | ICD-10-CM | POA: Diagnosis not present

## 2021-12-09 ENCOUNTER — Ambulatory Visit: Payer: Medicare Other | Admitting: Internal Medicine

## 2021-12-15 NOTE — Progress Notes (Addendum)
[place] [facility] [address], [place], [place] [address] [phone]  Date:  12/18/2021   Name:  Arthur Rich   DOB:  [birth year]   MRN:  [id number]  PCP:  [name], MD    Chief Complaint: Annual Exam (Concerns/ questions: the occasional left flank pain  /Zoster: none in ncir/Eye exam: a couple of weeks ago. [facility] [place])   History of Present Illness:  Arthur Rich is a 71 y.o. very pleasant male patient who presents with the following:  Patient seen today for Medicare "physical" Most recently seen by myself in April of last year- history of hypertension, CAD, atrial fib, diabetes, hyperlipidemia, sleep apnea, gout, TIA  He is always in a fib per his knowledge.  He is using his xarelto   He is now using Bipap and this is working well for him- he has finally gotten used to it.  He is feeling better and getting better rest   He does note an occasional pain in his LLQ- may occur 1-2x a month.   He has noted this for the last year perhaps Generally notes it in the am  It will be a sharp pain which lasts just a moment and then resolved He will generally note it when he moved to get out of bed- does not happen when he is still  His BM seem to be normal- he does not get constipated and has not seen any blood Wt Readings from Last 3 Encounters:  12/18/21 270 lb 9.6 oz (122.7 kg)  11/06/21 275 lb 6.4 oz (124.9 kg)  07/03/21 275 lb (124.7 kg)     Most recent visit with his cardiologist, Dr. [name] was last month 1. Coronary artery disease involving native coronary artery of native heart without angina pectoris   2. Permanent atrial fibrillation ([place])   3. Essential hypertension   4. Mixed hyperlipidemia   5. Obstructive sleep apnea   6. Anticoagulation adequate with anticoagulant therapy     PLAN:      Secondary prevention reviewed. Atrial fibrillation with controlled rate is noted. Blood  pressures well controlled Continue statin and ezetimibe therapy for target LDL less than 70 Noncompliant with BiPAP. Xarelto therapy without complications.  Hemoglobin and creatinine twice yearly.   Shingles vaccine- recommend  Eye exam-  done just recently  COVID-19 booster Update A1c Cologuard good till next year Lab Results  Component Value Date   HGBA1C 6.6 (H) 01/30/2021    Patient Active Problem List   Diagnosis Date Noted   Abnormal CT of the chest 07/12/2018   Hemoptysis 07/12/2018   History of tobacco use 07/12/2018   Controlled type 2 diabetes mellitus without complication, without long-term current use of insulin ([place]) 11/28/2016   Obesity (BMI 30-39.9) 09/28/2015   Vertigo 07/06/2015   OSA (obstructive sleep apnea) 04/10/2015   Excessive daytime sleepiness 04/10/2015   Permanent atrial fibrillation ([place]) 10/25/2014   Anticoagulation adequate with anticoagulant therapy 07/20/2013   SYNCOPE 05/01/2010   Mixed hyperlipidemia 04/30/2010   ESSENTIAL HYPERTENSION, BENIGN 04/30/2010   Coronary artery disease involving native coronary artery of native heart without angina pectoris 04/30/2010   ATRIAL FLUTTER 04/30/2010    Past Medical History:  Diagnosis Date   Atrial flutter ([place])    Cancer ([place])    skin   Coronary artery disease involving native coronary artery of native heart without angina pectoris 04/30/2010   LHC 9/09:  S/p BMS to pLAD // Myoview 4/18: No ischemia, low risk, not gated   Hyperlipidemia    Hypertension    Obesity (BMI 30-39.9) 09/28/2015   Overweight(278.02)    Permanent atrial fibrillation ([place]) 10/25/2014   Echo 10/14: EF 55-65%, mild MR, mod LAE, PASP 31 mmHg // Holter 9/16:  AFib, avg HR 70, rare HR up to 150, rare brady with 35 bpm and 3 sec pause during HS // Echo 4/18: EF 55-60, no RWMA, mild LAE, moderate RAE, trivial TR, PASP 28   Syncope and collapse 03/29/2010   TIA (transient ischemic attack)     Past Surgical History:  Procedure  Laterality Date   CARDIAC CATHETERIZATION  9/09   PCI LAD (BMS)   nasal septal surgery     TONSILLECTOMY     WISDOM TOOTH EXTRACTION      Social History   Tobacco Use   Smoking status: Former    Packs/day: 1.00    Years: 30.00    Pack years: 30.00    Types: Cigarettes    Quit date: 08/26/1996    Years since quitting: 25.3   Smokeless tobacco: Never  Substance Use Topics   Alcohol use: Yes    Alcohol/week: 2.0 standard drinks    Types: 2 Cans of beer per week    Comment: previously heavy, now trying to cut back   Drug use: No    Family History  Problem Relation Age of Onset   Diabetes Father    Heart disease Father    Heart attack Maternal Uncle        early 60's   Cancer Maternal Grandmother        unknown ?leukemia    Allergies  Allergen Reactions   Penicillins Rash    Has patient had a PCN reaction causing immediate rash, facial/tongue/throat swelling, SOB or lightheadedness with hypotension: Yes Has patient had a PCN reaction causing severe rash involving mucus membranes or skin necrosis: No Has patient had a PCN reaction that required hospitalization: No Has patient had a PCN reaction occurring within the last 10 years: No If all of the above answers are "NO", then may proceed with Cephalosporin use.    Medication list has been reviewed and updated.  Current Outpatient Medications on File Prior to Visit  Medication Sig Dispense Refill   acetaminophen (TYLENOL) 500 MG tablet Take 500 mg by mouth every 6 (six) hours as needed for headache (pain).     allopurinol (ZYLOPRIM) 100 MG tablet TAKE 1 TABLET BY MOUTH EVERY DAY 90 tablet 3   atorvastatin (LIPITOR) 80 MG tablet TAKE 1 TABLET (80 MG TOTAL) BY MOUTH DAILY. REQUESTED DRUG REFILLS ARE AUTHORIZED, HOWEVER, THE PATIENT NEEDS FURTHER EVALUATION AND/OR LABORATORY TESTING BEFORE FURTHER REFILLS ARE GIVEN. ASK HIM TO MAKE AN APPOINTMENT FOR THIS. 30 tablet 0   Cholecalciferol (VITAMIN D) 50 MCG (2000 UT) CAPS Take  2,000 Units by mouth daily.      diltiazem (CARDIZEM CD) 180 MG 24 hr capsule TAKE 1 CAPSULE BY MOUTH EVERY DAY 90 capsule 2   diltiazem (CARDIZEM) 30 MG tablet Take 1 tablet (30 mg total) by mouth 4 (four) times daily. 30 tablet 6   lisinopril (ZESTRIL) 40 MG tablet TAKE 1 TABLET BY MOUTH EVERY DAY 90 tablet 2   metFORMIN (GLUCOPHAGE) 500 MG tablet TAKE 1 TABLET BY MOUTH DAILY WITH SUPPER 30 tablet 0   metoprolol succinate (TOPROL-XL) 100 MG 24 hr tablet TAKE 1 TABLET IN THE AM AND 1/2 TABLET  IN THE PM 135 tablet 3   Polyethyl Glycol-Propyl Glycol (SYSTANE OP) Place 1 drop into both eyes daily as needed (dry eyes).      XARELTO 20 MG TABS tablet TAKE 1 TABLET BY MOUTH DAILY WITH SUPPER 90 tablet 1   ezetimibe (ZETIA) 10 MG tablet Take 1 tablet (10 mg total) by mouth daily. 90 tablet 2   No current facility-administered medications on file prior to visit.    Review of Systems:  As per HPI- otherwise negative.   Physical Examination: Vitals:   12/18/21 0927  BP: 122/80  Pulse: 82  Resp: 18  Temp: 97.8 F (36.6 C)  SpO2: 97%   Vitals:   12/18/21 0927  Weight: 270 lb 9.6 oz (122.7 kg)  Height: 6\' 2"  (1.88 m)   Body mass index is 34.74 kg/m. Ideal Body Weight: Weight in (lb) to have BMI = 25: 194.3  GEN: no acute distress. Obese, looks well  HEENT: Atraumatic, Normocephalic.  Bilateral TM wnl, oropharynx normal.  PEERL,EOMI.   Ears and Nose: No external deformity. CV: rate controlled a fib , No M/G/R. No JVD. No thrill. No extra heart sounds. PULM: CTA B, no wheezes, crackles, rhonchi. No retractions. No resp. distress. No accessory muscle use. ABD: S, NT, ND, +BS. No rebound. No HSM. EXTR: No c/c/e PSYCH: Normally interactive. Conversant.  Umbilical hernia is present No tenderness of belly  Assessment and Plan: Permanent atrial fibrillation (HCC) - Plan: CBC  Controlled type 2 diabetes mellitus without complication, without long-term current use of insulin (H[facility]) -  Plan: Comprehensive metabolic panel, Hemoglobin A1c, metFORMIN (GLUCOPHAGE) 500 MG tablet  Coronary artery disease involving native coronary artery of native heart without angina pectoris  Mixed hyperlipidemia - Plan: Lipid panel, atorvastatin (LIPITOR) 80 MG tablet  Essential hypertension - Plan: CBC, Comprehensive metabolic panel  Screening for prostate cancer - Plan: PSA, Medicare ( [place] only)  Chronic idiopathic gout involving toe of right foot without tophus - Plan: Uric acid  LLQ pain  Left lower quadrant abdominal pain - Plan: CT Abdomen Pelvis W Contrast  Pt seen today for a medicare "physical" Encouraged healthy diet and exercise routine Following up on diabetes and other chronic concerns today  He has noted an intermittent LLQ pain for a year- will order CT scan  Signed Lamar Blinks, MD  Addendum 3/2 Received labs as below, message to patient Results for orders placed or performed in visit on 12/18/21  CBC  Result Value Ref Range   WBC 5.8 4.0 - 10.5 K/uL   RBC 4.64 4.22 - 5.81 Mil/uL   Platelets 187.0 150.0 - 400.0 K/uL   Hemoglobin 14.1 13.0 - 17.0 g/dL   HCT 42.6 39.0 - 52.0 %   MCV 91.9 78.0 - 100.0 fl   MCHC 33.0 30.0 - 36.0 g/dL   RDW 14.1 11.5 - 15.5 %  Comprehensive metabolic panel  Result Value Ref Range   Sodium 140 135 - 145 mEq/L   Potassium 4.9 3.5 - 5.1 mEq/L   Chloride 104 96 - 112 mEq/L   CO2 29 19 - 32 mEq/L   Glucose, Bld 127 (H) 70 - 99 mg/dL   BUN 16 6 - 23 mg/dL   Creatinine, Ser 1.08 0.40 - 1.50 mg/dL   Total Bilirubin 2.0 (H) 0.2 - 1.2 mg/dL   Alkaline Phosphatase 78 39 - 117 U/L   AST 23 0 - 37 U/L   ALT 18 0 - 53 U/L   Total Protein 7.1  6.0 - 8.3 g/dL   Albumin 4.4 3.5 - 5.2 g/dL   GFR 69.60 >60.00 mL/min   Calcium 9.5 8.4 - 10.5 mg/dL  Hemoglobin A1c  Result Value Ref Range   Hgb A1c MFr Bld 6.7 (H) 4.6 - 6.5 %  Lipid panel  Result Value Ref Range   Cholesterol 124 0 - 200 mg/dL   Triglycerides 101.0 0.0 -  149.0 mg/dL   HDL 44.50 >39.00 mg/dL   VLDL 20.2 0.0 - 40.0 mg/dL   LDL Cholesterol 60 0 - 99 mg/dL   Total CHOL/HDL Ratio 3    NonHDL 79.99   PSA, Medicare ( [place] [facility] only)  Result Value Ref Range   PSA 0.38 0.10 - 4.00 ng/ml  Uric acid  Result Value Ref Range   Uric Acid, Serum 7.2 4.0 - 7.8 mg/dL

## 2021-12-15 NOTE — Patient Instructions (Addendum)
It was good to see you again today!  ?I will be in touch with your labs ?Consider getting the new shingles vaccine- Shingrix- at your pharmacy  ? ?We will set you up for a CT of your abdomen to further evaluate the pain you have noticed  ?

## 2021-12-18 ENCOUNTER — Ambulatory Visit (INDEPENDENT_AMBULATORY_CARE_PROVIDER_SITE_OTHER): Payer: Medicare Other | Admitting: Family Medicine

## 2021-12-18 VITALS — BP 122/80 | HR 82 | Temp 97.8°F | Resp 18 | Ht 74.0 in | Wt 270.6 lb

## 2021-12-18 DIAGNOSIS — I4821 Permanent atrial fibrillation: Secondary | ICD-10-CM

## 2021-12-18 DIAGNOSIS — I2510 Atherosclerotic heart disease of native coronary artery without angina pectoris: Secondary | ICD-10-CM | POA: Diagnosis not present

## 2021-12-18 DIAGNOSIS — Z125 Encounter for screening for malignant neoplasm of prostate: Secondary | ICD-10-CM | POA: Diagnosis not present

## 2021-12-18 DIAGNOSIS — R1032 Left lower quadrant pain: Secondary | ICD-10-CM | POA: Diagnosis not present

## 2021-12-18 DIAGNOSIS — M1A0710 Idiopathic chronic gout, right ankle and foot, without tophus (tophi): Secondary | ICD-10-CM

## 2021-12-18 DIAGNOSIS — I10 Essential (primary) hypertension: Secondary | ICD-10-CM | POA: Diagnosis not present

## 2021-12-18 DIAGNOSIS — E782 Mixed hyperlipidemia: Secondary | ICD-10-CM

## 2021-12-18 DIAGNOSIS — E119 Type 2 diabetes mellitus without complications: Secondary | ICD-10-CM | POA: Diagnosis not present

## 2021-12-18 LAB — COMPREHENSIVE METABOLIC PANEL
ALT: 18 U/L (ref 0–53)
AST: 23 U/L (ref 0–37)
Albumin: 4.4 g/dL (ref 3.5–5.2)
Alkaline Phosphatase: 78 U/L (ref 39–117)
BUN: 16 mg/dL (ref 6–23)
CO2: 29 mEq/L (ref 19–32)
Calcium: 9.5 mg/dL (ref 8.4–10.5)
Chloride: 104 mEq/L (ref 96–112)
Creatinine, Ser: 1.08 mg/dL (ref 0.40–1.50)
GFR: 69.6 mL/min (ref >60.00)
Glucose, Bld: 127 mg/dL — ABNORMAL HIGH (ref 70–99)
Potassium: 4.9 mEq/L (ref 3.5–5.1)
Sodium: 140 mEq/L (ref 135–145)
Total Bilirubin: 2 mg/dL — ABNORMAL HIGH (ref 0.2–1.2)
Total Protein: 7.1 g/dL (ref 6.0–8.3)

## 2021-12-18 LAB — CBC
HCT: 42.6 % (ref 39.0–52.0)
Hemoglobin: 14.1 g/dL (ref 13.0–17.0)
MCHC: 33 g/dL (ref 30.0–36.0)
MCV: 91.9 fl (ref 78.0–100.0)
Platelets: 187 10*3/uL (ref 150.0–400.0)
RBC: 4.64 Mil/uL (ref 4.22–5.81)
RDW: 14.1 % (ref 11.5–15.5)
WBC: 5.8 10*3/uL (ref 4.0–10.5)

## 2021-12-18 LAB — LIPID PANEL
Cholesterol: 124 mg/dL (ref 0–200)
HDL: 44.5 mg/dL (ref >39.00)
LDL Cholesterol: 60 mg/dL (ref 0–99)
NonHDL: 79.99
Total CHOL/HDL Ratio: 3
Triglycerides: 101 mg/dL (ref 0.0–149.0)
VLDL: 20.2 mg/dL (ref 0.0–40.0)

## 2021-12-18 LAB — HEMOGLOBIN A1C: Hgb A1c MFr Bld: 6.7 % — ABNORMAL HIGH (ref 4.6–6.5)

## 2021-12-18 LAB — PSA, MEDICARE: PSA: 0.38 ng/ml (ref 0.10–4.00)

## 2021-12-18 LAB — URIC ACID: Uric Acid, Serum: 7.2 mg/dL (ref 4.0–7.8)

## 2021-12-18 MED ORDER — ATORVASTATIN CALCIUM 80 MG PO TABS: 80.0000 mg | ORAL_TABLET | Freq: Every day | ORAL | 3 refills | Status: DC

## 2021-12-18 MED ORDER — METFORMIN HCL 500 MG PO TABS: 500.0000 mg | ORAL_TABLET | Freq: Every day | ORAL | 3 refills | Status: DC

## 2021-12-19 ENCOUNTER — Encounter: Payer: Self-pay | Admitting: Family Medicine

## 2021-12-21 ENCOUNTER — Encounter: Payer: Self-pay | Admitting: Family Medicine

## 2021-12-27 ENCOUNTER — Ambulatory Visit (HOSPITAL_BASED_OUTPATIENT_CLINIC_OR_DEPARTMENT_OTHER)
Admission: RE | Admit: 2021-12-27 | Discharge: 2021-12-27 | Disposition: A | Discharge status: Home / Self Care | Payer: Medicare Other | Source: Ambulatory Visit | Attending: Family Medicine | Admitting: Family Medicine

## 2021-12-27 ENCOUNTER — Other Ambulatory Visit: Payer: Self-pay

## 2021-12-27 DIAGNOSIS — R1032 Left lower quadrant pain: Secondary | ICD-10-CM | POA: Diagnosis not present

## 2021-12-27 DIAGNOSIS — K760 Fatty (change of) liver, not elsewhere classified: Secondary | ICD-10-CM | POA: Diagnosis not present

## 2021-12-27 DIAGNOSIS — K5730 Diverticulosis of large intestine without perforation or abscess without bleeding: Secondary | ICD-10-CM | POA: Diagnosis not present

## 2021-12-27 DIAGNOSIS — N281 Cyst of kidney, acquired: Secondary | ICD-10-CM | POA: Diagnosis not present

## 2021-12-27 MED ORDER — IOHEXOL 300 MG/ML  SOLN
100.0000 mL | Freq: Once | INTRAMUSCULAR | Status: AC | PRN
  Administered 2021-12-27: 100 mL via INTRAVENOUS

## 2021-12-28 ENCOUNTER — Encounter: Payer: Self-pay | Admitting: Family Medicine

## 2022-01-19 ENCOUNTER — Other Ambulatory Visit: Payer: Self-pay | Admitting: Family Medicine

## 2022-01-19 DIAGNOSIS — M1A0710 Idiopathic chronic gout, right ankle and foot, without tophus (tophi): Secondary | ICD-10-CM

## 2022-02-14 ENCOUNTER — Other Ambulatory Visit: Payer: Self-pay | Admitting: Interventional Cardiology

## 2022-02-14 DIAGNOSIS — I4821 Permanent atrial fibrillation: Secondary | ICD-10-CM

## 2022-02-14 NOTE — Telephone Encounter (Signed)
Prescription refill request for Xarelto received.  ?Indication: Afib  ?Last office visit: 11/06/21 Arthur Rich)  ?Weight: 122.7kg ?Age: 71 ?Scr: 1.08 (12/18/21) ?CrCl: 110.7m/min ? ?Appropriate dose and refill sent to requested pharmacy.  ?

## 2022-04-13 ENCOUNTER — Other Ambulatory Visit: Payer: Self-pay | Admitting: Interventional Cardiology

## 2022-05-18 IMAGING — DX DG CHEST 2V
2 series · 2 of 2 positions shown · non-contrast
Comparison: 09/25/2018

CLINICAL DATA: Right rib pain

EXAM:
CHEST - 2 VIEW

[chest pa]
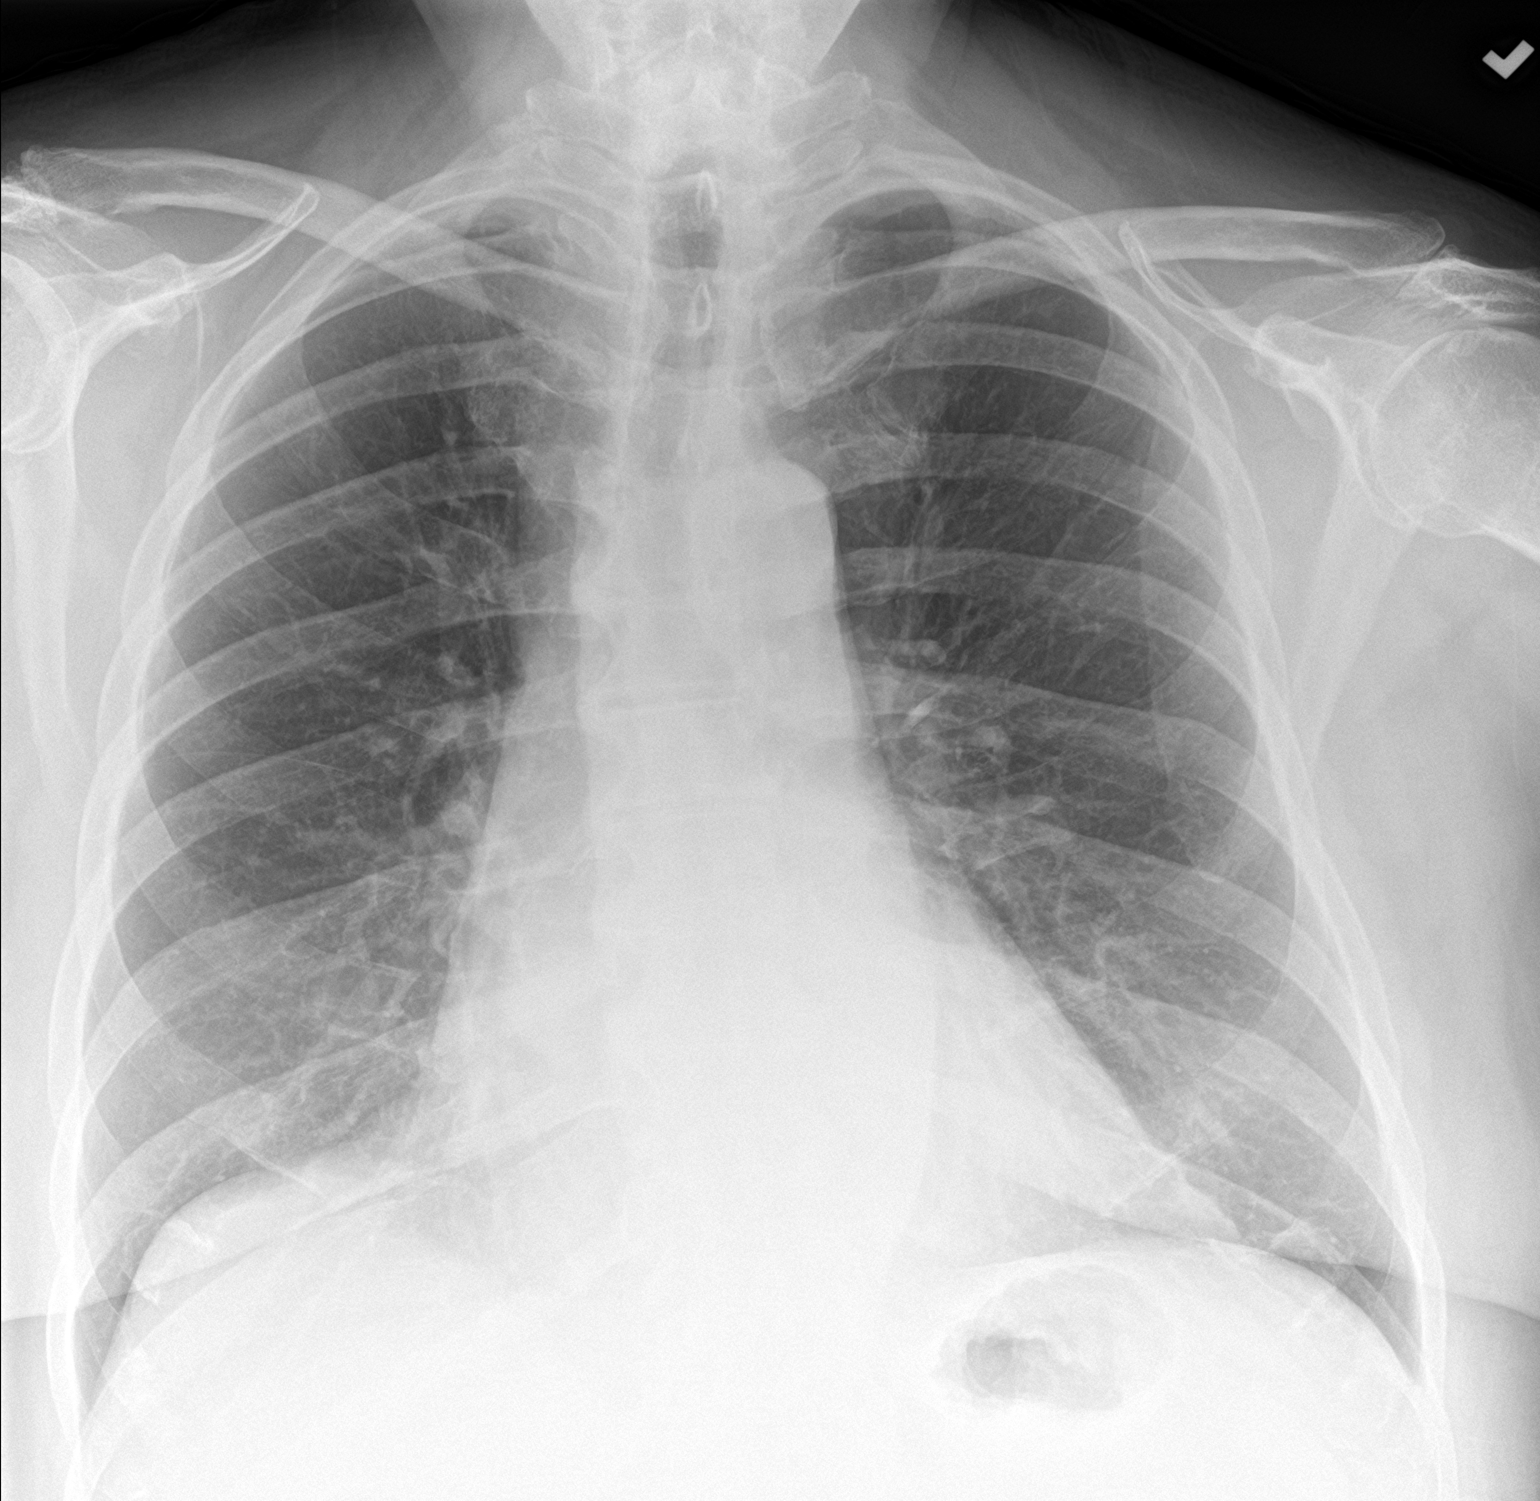

[chest lat]
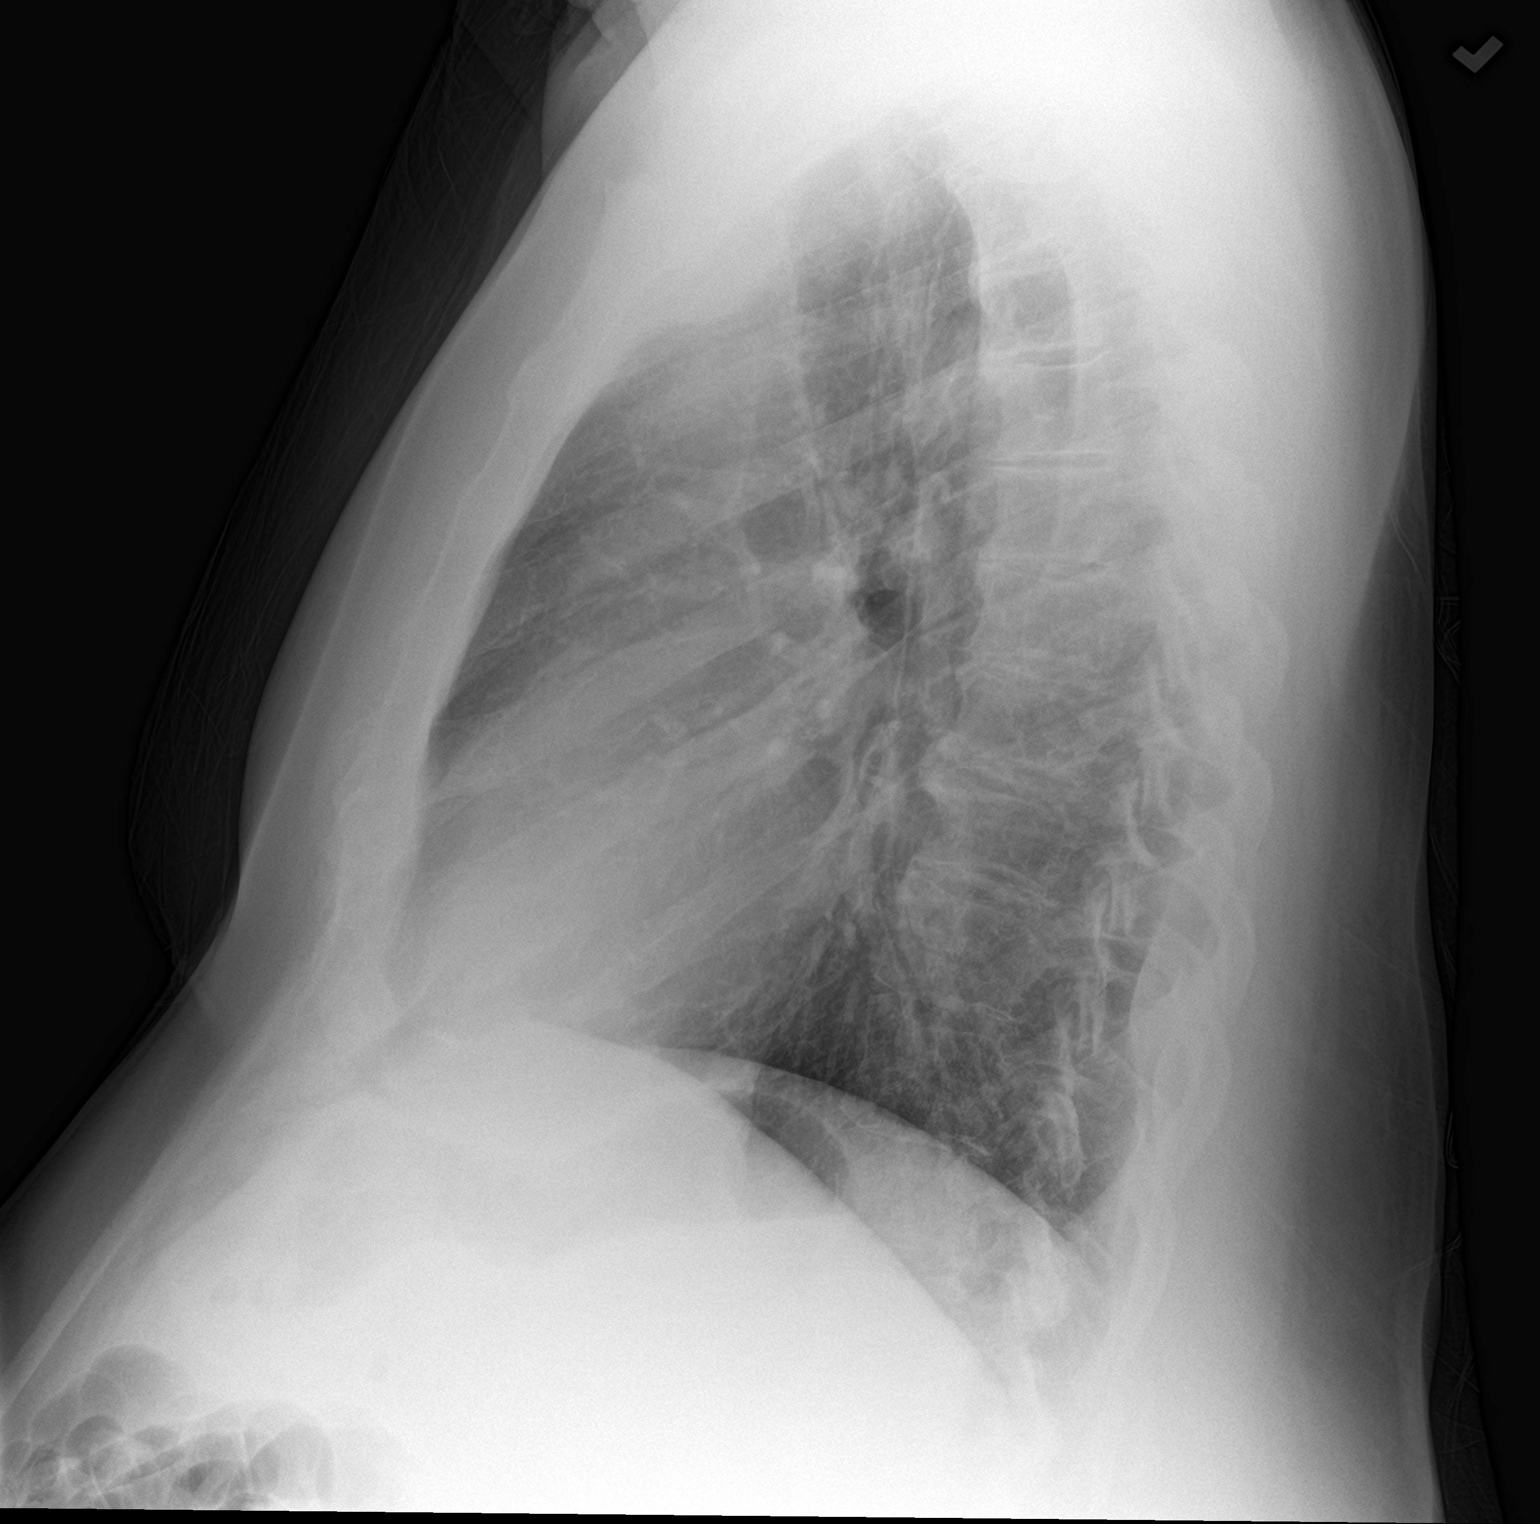

[2 of 2 positions shown; findings below may reference images not displayed]

FINDINGS: The heart size and mediastinal contours are within normal limits.
Both lungs are clear. Degenerative changes of the spine.
IMPRESSION: No active cardiopulmonary disease.

## 2022-06-06 ENCOUNTER — Other Ambulatory Visit (HOSPITAL_COMMUNITY): Payer: Self-pay | Admitting: Interventional Cardiology

## 2022-07-29 DIAGNOSIS — Z23 Encounter for immunization: Secondary | ICD-10-CM | POA: Diagnosis not present

## 2022-08-11 ENCOUNTER — Other Ambulatory Visit: Payer: Self-pay | Admitting: Interventional Cardiology

## 2022-08-11 DIAGNOSIS — I4821 Permanent atrial fibrillation: Secondary | ICD-10-CM

## 2022-08-11 NOTE — Telephone Encounter (Signed)
Prescription refill request for Xarelto received.  Indication:Afib Last office visit:1/23 Weight:122.7 kg Age:71 Scr:1.0 CrCl:117.59  ml/min  Prescription refilled

## 2022-08-18 DIAGNOSIS — D225 Melanocytic nevi of trunk: Secondary | ICD-10-CM | POA: Diagnosis not present

## 2022-08-18 DIAGNOSIS — L570 Actinic keratosis: Secondary | ICD-10-CM | POA: Diagnosis not present

## 2022-08-18 DIAGNOSIS — L820 Inflamed seborrheic keratosis: Secondary | ICD-10-CM | POA: Diagnosis not present

## 2022-08-18 DIAGNOSIS — L814 Other melanin hyperpigmentation: Secondary | ICD-10-CM | POA: Diagnosis not present

## 2022-08-18 DIAGNOSIS — L821 Other seborrheic keratosis: Secondary | ICD-10-CM | POA: Diagnosis not present

## 2022-08-18 DIAGNOSIS — Z85828 Personal history of other malignant neoplasm of skin: Secondary | ICD-10-CM | POA: Diagnosis not present

## 2022-08-18 DIAGNOSIS — D485 Neoplasm of uncertain behavior of skin: Secondary | ICD-10-CM | POA: Diagnosis not present

## 2022-09-05 ENCOUNTER — Other Ambulatory Visit: Payer: Self-pay | Admitting: Interventional Cardiology

## 2022-09-12 ENCOUNTER — Other Ambulatory Visit: Payer: Self-pay | Admitting: Interventional Cardiology

## 2022-10-01 ENCOUNTER — Other Ambulatory Visit: Payer: Self-pay | Admitting: Family Medicine

## 2022-10-01 DIAGNOSIS — I10 Essential (primary) hypertension: Secondary | ICD-10-CM

## 2022-10-06 ENCOUNTER — Telehealth: Payer: Self-pay | Admitting: Interventional Cardiology

## 2022-10-06 NOTE — Telephone Encounter (Signed)
Calling to see what dr would dr [name] recommend him to start seeing. Please advise

## 2022-10-07 NOTE — Telephone Encounter (Signed)
Responded to patient via MyChart message. 

## 2022-10-08 DIAGNOSIS — Z23 Encounter for immunization: Secondary | ICD-10-CM | POA: Diagnosis not present

## 2022-10-09 ENCOUNTER — Emergency Department (HOSPITAL_COMMUNITY): Payer: Medicare Other

## 2022-10-09 ENCOUNTER — Other Ambulatory Visit: Payer: Self-pay

## 2022-10-09 ENCOUNTER — Emergency Department (HOSPITAL_COMMUNITY)
Admission: EM | Admit: 2022-10-09 | Discharge: 2022-10-10 | Discharge status: Against Medical Advice | Payer: Medicare Other | Attending: Emergency Medicine | Admitting: Emergency Medicine

## 2022-10-09 ENCOUNTER — Encounter (HOSPITAL_COMMUNITY): Payer: Self-pay

## 2022-10-09 DIAGNOSIS — I4891 Unspecified atrial fibrillation: Secondary | ICD-10-CM | POA: Insufficient documentation

## 2022-10-09 DIAGNOSIS — R002 Palpitations: Secondary | ICD-10-CM | POA: Diagnosis not present

## 2022-10-09 DIAGNOSIS — Z1152 Encounter for screening for COVID-19: Secondary | ICD-10-CM | POA: Insufficient documentation

## 2022-10-09 DIAGNOSIS — R000 Tachycardia, unspecified: Secondary | ICD-10-CM | POA: Diagnosis not present

## 2022-10-09 DIAGNOSIS — G4489 Other headache syndrome: Secondary | ICD-10-CM | POA: Diagnosis not present

## 2022-10-09 DIAGNOSIS — I10 Essential (primary) hypertension: Secondary | ICD-10-CM | POA: Diagnosis not present

## 2022-10-09 HISTORY — DX: Type 2 diabetes mellitus without complications: E11.9

## 2022-10-09 NOTE — ED Provider Triage Note (Addendum)
  Emergency Medicine Provider Triage Evaluation Note  MRN:  [id number]  Arrival date & time: 10/09/22    Medically screening exam initiated at 11:10 PM.   CC:   Palpitations  HPI:  Arthur Rich is a 71 y.o. year-old male presents to the ED with chief complaint of feeling bad.  Reports palpitations and racing heart.  Reports associated nausea.  Reports headache. States he hasn't felt as strong today.  Has had high BP.  Also notes that he got a COVID booster yesterday.  History provided by patient. ROS:  -As included in HPI PE:   Vitals:   10/09/22 2305  BP: (!) 158/96  Pulse: 94  Resp: 14  Temp: 98 F (36.7 C)  SpO2: 96%    Non-toxic appearing No respiratory distress Normal rate, irregular rhythm  MDM:  Based on signs and symptoms, flu is highest on my differential. I've ordered labs and imaging in triage to expedite lab/diagnostic workup.  Patient was informed that the remainder of the evaluation will be completed by another provider, this initial triage assessment does not replace that evaluation, and the importance of remaining in the ED until their evaluation is complete.    Montine Circle, PA-C 10/09/22 2312    Montine Circle, PA-C 10/09/22 2314

## 2022-10-09 NOTE — ED Triage Notes (Incomplete)
Pt to ED via [facility]. Pt felt like he was having palpitations, pt has afib and per EMS pt is in afib. HR=80s-110s. 22g LAC  160/76 Cbg=157 98% RA

## 2022-10-09 NOTE — ED Triage Notes (Signed)
Pt to ED via [facility]. Pt felt like he was having palpitations, pt has afib and per EMS pt is in afib. HR=80s-110s. 22g LAC  160/76 Cbg=157 98% RA

## 2022-10-09 NOTE — ED Triage Notes (Signed)
Pt states he is constantly in afib but he does not usually feel it, but he was able to feel it tonight.  Pt denies shortness of breath, nausea or vomiting, has had decreased appetite.

## 2022-10-10 ENCOUNTER — Ambulatory Visit (INDEPENDENT_AMBULATORY_CARE_PROVIDER_SITE_OTHER): Payer: Medicare Other | Admitting: Nurse Practitioner

## 2022-10-10 ENCOUNTER — Encounter: Payer: Self-pay | Admitting: Nurse Practitioner

## 2022-10-10 ENCOUNTER — Ambulatory Visit: Payer: Medicare Other | Admitting: Nurse Practitioner

## 2022-10-10 VITALS — BP 129/77 | HR 71 | Temp 97.5°F | Resp 16 | Wt 266.0 lb

## 2022-10-10 DIAGNOSIS — I10 Essential (primary) hypertension: Secondary | ICD-10-CM

## 2022-10-10 DIAGNOSIS — I4891 Unspecified atrial fibrillation: Secondary | ICD-10-CM | POA: Diagnosis not present

## 2022-10-10 DIAGNOSIS — I4821 Permanent atrial fibrillation: Secondary | ICD-10-CM

## 2022-10-10 LAB — CBC
HCT: 42.8 % (ref 39.0–52.0)
Hemoglobin: 14.8 g/dL (ref 13.0–17.0)
MCH: 31.7 pg (ref 26.0–34.0)
MCHC: 34.6 g/dL (ref 30.0–36.0)
MCV: 91.6 fL (ref 80.0–100.0)
Platelets: 164 10*3/uL (ref 150–400)
RBC: 4.67 MIL/uL (ref 4.22–5.81)
RDW: 13 % (ref 11.5–15.5)
WBC: 8.2 10*3/uL (ref 4.0–10.5)
nRBC: 0 % (ref 0.0–0.2)

## 2022-10-10 LAB — RESP PANEL BY RT-PCR (RSV, FLU A&B, COVID)  RVPGX2
Influenza A by PCR: NEGATIVE
Influenza B by PCR: NEGATIVE
Resp Syncytial Virus by PCR: NEGATIVE
SARS Coronavirus 2 by RT PCR: NEGATIVE

## 2022-10-10 LAB — BASIC METABOLIC PANEL
Anion gap: 6 (ref 5–15)
BUN: 19 mg/dL (ref 8–23)
CO2: 25 mmol/L (ref 22–32)
Calcium: 9.4 mg/dL (ref 8.9–10.3)
Chloride: 102 mmol/L (ref 98–111)
Creatinine, Ser: 1.12 mg/dL (ref 0.61–1.24)
GFR, Estimated: >60 mL/min (ref >60)
Glucose, Bld: 146 mg/dL — ABNORMAL HIGH (ref 70–99)
Potassium: 4.8 mmol/L (ref 3.5–5.1)
Sodium: 133 mmol/L — ABNORMAL LOW (ref 135–145)

## 2022-10-10 LAB — TROPONIN I (HIGH SENSITIVITY)
Troponin I (High Sensitivity): 7 ng/L (ref <18)
Troponin I (High Sensitivity): 9 ng/L (ref <18)

## 2022-10-10 NOTE — Assessment & Plan Note (Signed)
Chronic issue, as patient is permanently in a-fib. Stable. Rate is controlled. Encouraged patient to keep his cardiology appt in March for follow up and to continue his Xarelto '20mg'$  once daily, Toprol XL '100mg'$  1.5 tabs daily, Diltiazem '30mg'$  4x daily and Diltiazem CD '180mg'$  daily.

## 2022-10-10 NOTE — Assessment & Plan Note (Signed)
Chronic issue. Stable on Lisinopril '40mg'$  daily and Toprol XL '100mg'$  1.5 tablets daily. Patient will monitor blood pressure at home and continue taking his medications as prescribed. He will follow up with Cardiology in March as scheduled.

## 2022-10-10 NOTE — Progress Notes (Signed)
Tomasita Morrow, NP-C Phone: [phone]  Arthur Rich is a 71 y.o. male who presents today for ED follow up.  Patient reports having feelings of heart palpitations and elevated blood pressure last night and sought care in the ED. Reports seeing triage, having labs, and an EKG, then after a long wait time he left without seeing a provider.   Reports a long history of A-Fib and HTN. He is followed by Cardiology. He reports feeling much better this morning. Denies any feelings of palpitations at this time.   HYPERTENSION Chest pain- no    Dyspnea- no Medications Compliance-  yes, currently taking all meds as Rx'd. Lightheadedness-  no  Edema- no  BMET    Component Value Date/Time   NA 133 (L) 10/09/2022 2325   NA 140 06/04/2021 0957   K 4.8 10/09/2022 2325   CL 102 10/09/2022 2325   CO2 25 10/09/2022 2325   GLUCOSE 146 (H) 10/09/2022 2325   BUN 19 10/09/2022 2325   BUN 12 06/04/2021 0957   CREATININE 1.12 10/09/2022 2325   CREATININE 0.87 01/10/2016 1210   CALCIUM 9.4 10/09/2022 2325   GFRNONAA >60 10/09/2022 2325   GFRAA 86 08/08/2020 1117     Social History   Tobacco Use  Smoking Status Former   Packs/day: 1.00   Years: 30.00   Total pack years: 30.00   Types: Cigarettes   Quit date: 08/26/1996   Years since quitting: 26.1  Smokeless Tobacco Never    Current Outpatient Medications on File Prior to Visit  Medication Sig Dispense Refill   acetaminophen (TYLENOL) 500 MG tablet Take 500 mg by mouth every 6 (six) hours as needed for headache (pain).     allopurinol (ZYLOPRIM) 100 MG tablet TAKE 1 TABLET BY MOUTH EVERY DAY 90 tablet 3   atorvastatin (LIPITOR) 80 MG tablet Take 1 tablet (80 mg total) by mouth daily. 90 tablet 3   Cholecalciferol (VITAMIN D) 50 MCG (2000 UT) CAPS Take 2,000 Units by mouth daily.      diltiazem (CARDIZEM CD) 180 MG 24 hr capsule TAKE 1 CAPSULE BY MOUTH EVERY DAY 90 capsule 1   diltiazem (CARDIZEM) 30 MG tablet Take 1 tablet (30 mg total)  by mouth 4 (four) times daily. 30 tablet 6   ezetimibe (ZETIA) 10 MG tablet TAKE 1 TABLET BY MOUTH EVERY DAY 90 tablet 0   lisinopril (ZESTRIL) 40 MG tablet TAKE 1 TABLET BY MOUTH EVERY DAY 90 tablet 2   metFORMIN (GLUCOPHAGE) 500 MG tablet Take 1 tablet (500 mg total) by mouth daily with supper. 90 tablet 3   metoprolol succinate (TOPROL-XL) 100 MG 24 hr tablet TAKE 1 TABLET IN THE AM AND 1/2 TABLET IN THE PM 135 tablet 0   Polyethyl Glycol-Propyl Glycol (SYSTANE OP) Place 1 drop into both eyes daily as needed (dry eyes).      XARELTO 20 MG TABS tablet TAKE 1 TABLET BY MOUTH DAILY WITH SUPPER 90 tablet 1   No current facility-administered medications on file prior to visit.     Review of Systems  Constitutional:  Negative for malaise/fatigue.  Respiratory:  Negative for shortness of breath.   Cardiovascular:  Negative for chest pain, palpitations and leg swelling.  Neurological:  Negative for dizziness and headaches.     Objective  Physical Exam Vitals:   10/10/22 1133  BP: 129/77  Pulse: 71  Resp: 16  Temp: (!) 97.5 F (36.4 C)  SpO2: 98%    BP Readings from Last  3 Encounters:  10/10/22 129/77  10/10/22 (!) 141/84  12/18/21 122/80   Wt Readings from Last 3 Encounters:  10/10/22 266 lb (120.7 kg)  10/09/22 270 lb (122.5 kg)  12/18/21 270 lb 9.6 oz (122.7 kg)    Physical Exam Constitutional:      General: He is not in acute distress.    Appearance: Normal appearance.  Cardiovascular:     Rate and Rhythm: Normal rate. Rhythm irregular.     Comments: Currently in a-fib, which is his baseline. Rate controlled. Pulmonary:     Effort: Pulmonary effort is normal.     Breath sounds: Normal breath sounds.  Neurological:     Mental Status: He is alert.     Assessment/Plan: Please see individual problem list.  Permanent atrial fibrillation [facility]) Assessment & Plan: Chronic issue, as patient is permanently in a-fib. Stable. Rate is controlled. Encouraged patient to  keep his cardiology appt in March for follow up and to continue his Xarelto '20mg'$  once daily, Toprol XL '100mg'$  1.5 tabs daily, Diltiazem '30mg'$  4x daily and Diltiazem CD '180mg'$  daily.   ESSENTIAL HYPERTENSION, BENIGN Assessment & Plan: Chronic issue. Stable on Lisinopril '40mg'$  daily and Toprol XL '100mg'$  1.5 tablets daily. Patient will monitor blood pressure at home and continue taking his medications as prescribed. He will follow up with Cardiology in March as scheduled.      ED notes, labs and EKG were all reviewed.    Tomasita Morrow, NP-C Deputy

## 2022-10-28 DIAGNOSIS — G4733 Obstructive sleep apnea (adult) (pediatric): Secondary | ICD-10-CM | POA: Diagnosis not present

## 2022-10-28 DIAGNOSIS — E669 Obesity, unspecified: Secondary | ICD-10-CM | POA: Diagnosis not present

## 2022-10-28 DIAGNOSIS — I10 Essential (primary) hypertension: Secondary | ICD-10-CM | POA: Diagnosis not present

## 2022-11-27 ENCOUNTER — Other Ambulatory Visit: Payer: Self-pay

## 2022-11-27 MED ORDER — METOPROLOL SUCCINATE ER 100 MG PO TB24: ORAL_TABLET | ORAL | 0 refills | Status: DC

## 2022-12-01 ENCOUNTER — Other Ambulatory Visit: Payer: Self-pay

## 2022-12-01 MED ORDER — DILTIAZEM HCL ER COATED BEADS 180 MG PO CP24: ORAL_CAPSULE | ORAL | 1 refills | Status: DC

## 2022-12-05 ENCOUNTER — Encounter: Payer: Self-pay | Admitting: Internal Medicine

## 2022-12-05 ENCOUNTER — Ambulatory Visit: Payer: Medicare Other | Attending: Internal Medicine | Admitting: Internal Medicine

## 2022-12-05 VITALS — BP 122/68 | HR 58 | Ht 73.5 in | Wt 266.0 lb

## 2022-12-05 DIAGNOSIS — G4733 Obstructive sleep apnea (adult) (pediatric): Secondary | ICD-10-CM | POA: Insufficient documentation

## 2022-12-05 DIAGNOSIS — Z87891 Personal history of nicotine dependence: Secondary | ICD-10-CM | POA: Insufficient documentation

## 2022-12-05 DIAGNOSIS — I2510 Atherosclerotic heart disease of native coronary artery without angina pectoris: Secondary | ICD-10-CM | POA: Diagnosis not present

## 2022-12-05 DIAGNOSIS — E782 Mixed hyperlipidemia: Secondary | ICD-10-CM | POA: Insufficient documentation

## 2022-12-05 DIAGNOSIS — I10 Essential (primary) hypertension: Secondary | ICD-10-CM | POA: Diagnosis not present

## 2022-12-05 DIAGNOSIS — I4821 Permanent atrial fibrillation: Secondary | ICD-10-CM | POA: Diagnosis not present

## 2022-12-05 NOTE — Addendum Note (Signed)
Addended by: Precious Gilding on: 12/05/2022 10:34 AM   Modules accepted: Orders

## 2022-12-05 NOTE — Progress Notes (Addendum)
Cardiology Office Note:    Date:  12/05/2022   ID:  Arthur Rich, DOB [birth year], MRN [id number]  PCP:  [name], MD   [facility] Prov[facility] Cardiologist:  Arthur Grooms, MD (Inactive) Electrophysiologist:  Arthur Grayer, MD (Inactive)     Referring MD: [name], MD   CC: Follow up ED.  History of Present Illness:    Arthur Rich is a 72 y.o. male with a hx of Permanent AF, OSA (onCPAP) HTN with DM, Obstructive CAD with distant PCI to pLAD (2009). 2023.  Asymptomatic doing well with Dr. Tamala Rich and with EP.  Patient notes that he is doing well.   Had "symptomatic AF" after COVID-19 booster.  Had ED visit with benign findings.  He has rare near syncope or sinking feeling.  Notes near nausea during those spells .  Last was in 09/2022.  No chest pain or pressure .  No SOB/DOE and no PND/Orthopnea.  No weight gain or leg swelling. Asymptomatic when in AF. Ambulatory blood pressure rarely done.   Past Medical History:  Diagnosis Date   Atrial flutter ([place])    Cancer ([place])    skin   Coronary artery disease involving native coronary artery of native heart without angina pectoris 04/30/2010   LHC 9/09:  S/p BMS to pLAD // Myoview 4/18: No ischemia, low risk, not gated   Diabetes mellitus without complication ([place])    Hyperlipidemia    Hypertension    Obesity (BMI 30-39.9) 09/28/2015   Overweight(278.02)    Permanent atrial fibrillation ([place]) 10/25/2014   Echo 10/14: EF 55-65%, mild MR, mod LAE, PASP 31 mmHg // Holter 9/16:  AFib, avg HR 70, rare HR up to 150, rare brady with 35 bpm and 3 sec pause during HS // Echo 4/18: EF 55-60, no RWMA, mild LAE, moderate RAE, trivial TR, PASP 28   Syncope and collapse 03/29/2010   TIA (transient ischemic attack)     Past Surgical History:  Procedure Laterality Date   CARDIAC CATHETERIZATION  9/09   PCI LAD (BMS)   nasal septal surgery     TONSILLECTOMY     WISDOM TOOTH EXTRACTION       Current Medications: Current Meds  Medication Sig   acetaminophen (TYLENOL) 500 MG tablet Take 500 mg by mouth every 6 (six) hours as needed for headache (pain).   allopurinol (ZYLOPRIM) 100 MG tablet TAKE 1 TABLET BY MOUTH EVERY DAY   atorvastatin (LIPITOR) 80 MG tablet Take 1 tablet (80 mg total) by mouth daily.   Cholecalciferol (VITAMIN D) 50 MCG (2000 UT) CAPS Take 2,000 Units by mouth daily.    diltiazem (CARDIZEM CD) 180 MG 24 hr capsule TAKE 1 CAPSULE BY MOUTH EVERY DAY   ezetimibe (ZETIA) 10 MG tablet TAKE 1 TABLET BY MOUTH EVERY DAY   lisinopril (ZESTRIL) 40 MG tablet TAKE 1 TABLET BY MOUTH EVERY DAY   metFORMIN (GLUCOPHAGE) 500 MG tablet Take 1 tablet (500 mg total) by mouth daily with supper.   metoprolol succinate (TOPROL-XL) 100 MG 24 hr tablet Take with or immediately following a meal. (Patient taking differently: 150 mg daily. Take 169m am, 555mpm..Take with or immediately following a meal.)   Polyethyl Glycol-Propyl Glycol (SYSTANE OP) Place 1 drop into both eyes daily as needed (dry eyes).    XARELTO 20 MG TABS tablet TAKE 1 TABLET BY MOUTH DAILY WITH SUPPER     Allergies:   Penicillins   Social History  Socioeconomic History   Marital status: Single    Spouse name: Not on file   Number of children: Not on file   Years of education: Not on file   Highest education level: Not on file  Occupational History   Not on file  Tobacco Use   Smoking status: Former    Packs/day: 1.00    Years: 30.00    Total pack years: 30.00    Types: Cigarettes    Quit date: 08/26/1996    Years since quitting: 26.2   Smokeless tobacco: Never  Substance and Sexual Activity   Alcohol use: Yes    Alcohol/week: 2.0 standard drinks of alcohol    Types: 2 Cans of beer per week    Comment: previously heavy, now trying to cut back   Drug use: No   Sexual activity: Not on file  Other Topics Concern   Not on file  Social History Narrative   Not on file   Social Determinants of  Health   Financial Resource Strain: Low Risk  (01/09/2020)   Overall Financial Resource Strain (CARDIA)    Difficulty of Paying Living Expenses: Not hard at all  Food Insecurity: No Food Insecurity (01/09/2020)   Hunger Vital Sign    Worried About Running Out of Food in the Last Year: Never true    Ran Out of Food in the Last Year: Never true  Transportation Needs: No Transportation Needs (01/09/2020)   PRAPARE - Hydrologist (Medical): No    Lack of Transportation (Non-Medical): No  Physical Activity: Not on file  Stress: Not on file  Social Connections: Not on file     Family History: The patient's family history includes Cancer in his maternal grandmother; Diabetes in his father; Heart attack in his maternal uncle; Heart disease in his father.  ROS:   Please see the history of present illness.     All other systems reviewed and are negative.  EKGs/Labs/Other Studies Reviewed:    The following studies were reviewed today:  EKG:  EKG is  ordered today.  The ekg ordered today demonstrates  12/05/22: Atrial fibrillation with slow ventricular response  Cardiac Studies & Procedures     STRESS TESTS  MYOCARDIAL PERFUSION IMAGING 06/13/2021  Narrative   The study is normal. The study is low risk.   No ST deviation was noted.   Nuclear stress EF: 57 %. The left ventricular ejection fraction is normal (55-65%). Left ventricular function is normal. End diastolic cavity size is normal.  Low risk stress nuclear study with normal perfusion and normal left ventricular regional and global systolic function.   ECHOCARDIOGRAM  ECHOCARDIOGRAM COMPLETE 01/27/2017  Narrative *[facility]* [address], [place] [address] [phone]  ------------------------------------------------------------------- Transthoracic Echocardiography  Patient:    Arthur, Rich MR #:       [id number] Study Date: 01/27/2017 Gender:     M Age:         75 Height:     188 cm Weight:     126.6 kg BSA:        2.61 m^2 Pt. Status: Room:  Faylene Million, Juliann Mule T ATTENDING    Lyman Bishop MD SONOGRAPHER  Marygrace Drought, RCS PERFORMING   [facility], Outpatient  cc:  ------------------------------------------------------------------- LV EF: 55% -   60%  ------------------------------------------------------------------- Indications:      SOB (R06.02).  ------------------------------------------------------------------- History:  PMH:  OSA, Hyperlipidemia.  Atrial fibrillation.  Risk factors:  Hypertension.  ------------------------------------------------------------------- Study Conclusions  - Left ventricle: The cavity size was normal. Wall thickness was normal. Systolic function was normal. The estimated ejection fraction was in the range of 55% to 60%. Wall motion was normal; there were no regional wall motion abnormalities. The study is not technically sufficient to allow evaluation of LV diastolic function. - Left atrium: The atrium was mildly dilated. - Right atrium: Moderately dilated. - Tricuspid valve: There was trivial regurgitation. - Pulmonary arteries: PA peak pressure: 28 mm Hg (S). - Inferior vena cava: The vessel was normal in size. The respirophasic diameter changes were in the normal range (= 50%), consistent with normal central venous pressure.  Impressions:  - Compared to a prior study in 2014, the LVEF is unchanged. A-fib is present and there is now mild LAE and moderate RAE.  ------------------------------------------------------------------- Study data:  Comparison was made to the study of 08/05/2013.  Study status:  Routine.  Procedure:  The patient reported no pain pre or post test. Transthoracic echocardiography. Image quality was adequate.          Transthoracic echocardiography.  M-mode, complete 2D, spectral Doppler, and color Doppler.  Birthdate: Patient  birthdate: [birth year].  Age:  Patient is 72 yr old.  Sex: Gender: male.    BMI: 35.8 kg/m^2.  Blood pressure:     130/80 Patient status:  Outpatient.  Study date:  Study date: 01/27/2017. Study time: 11:39 AM.  Location:  [facility]  -------------------------------------------------------------------  ------------------------------------------------------------------- Left ventricle:  The cavity size was normal. Wall thickness was normal. Systolic function was normal. The estimated ejection fraction was in the range of 55% to 60%. Wall motion was normal; there were no regional wall motion abnormalities. The study is not technically sufficient to allow evaluation of LV diastolic function.  ------------------------------------------------------------------- Aortic valve:   Structurally normal valve. Trileaflet. Cusp separation was normal.  Doppler:  Transvalvular velocity was within the normal range. There was no stenosis. There was no regurgitation.  ------------------------------------------------------------------- Aorta:  Aortic root: The aortic root was normal in size. Ascending aorta: The ascending aorta was normal in size.  ------------------------------------------------------------------- Mitral valve:   Structurally normal valve.   Leaflet separation was normal.  Doppler:  Transvalvular velocity was within the normal range. There was no evidence for stenosis. There was no regurgitation.    Peak gradient (D): 4 mm Hg.  ------------------------------------------------------------------- Left atrium:  The atrium was mildly dilated.  ------------------------------------------------------------------- Right ventricle:  The cavity size was normal. Wall thickness was normal. Systolic function was normal.  ------------------------------------------------------------------- Pulmonic valve:    The valve appears to be grossly normal. Doppler:  There was no significant  regurgitation.  ------------------------------------------------------------------- Tricuspid valve:   Doppler:  There was trivial regurgitation.  ------------------------------------------------------------------- Pulmonary artery:   The main pulmonary artery was normal-sized.  ------------------------------------------------------------------- Right atrium:  Moderately dilated.  ------------------------------------------------------------------- Pericardium:  There was no pericardial effusion.  ------------------------------------------------------------------- Systemic veins: Inferior vena cava: The vessel was normal in size. The respirophasic diameter changes were in the normal range (= 50%), consistent with normal central venous pressure. Diameter: 23 mm.  ------------------------------------------------------------------- Measurements  IVC                                         Value        Reference ID  23    mm     ---------  Left ventricle                              Value        Reference LV ID, ED, PLAX chordal                     47    mm     43 - 52 LV ID, ES, PLAX chordal                     34.1  mm     23 - 38 LV fx shortening, PLAX chordal      (L)     27    %      >=29 LV PW thickness, ED                         10.8  mm     --------- IVS/LV PW ratio, ED                         0.94         <=1.3 Stroke volume, 2D                           66    ml     --------- Stroke volume/bsa, 2D                       25    ml/m^2 --------- LV e&', lateral                              13.2  cm/s   --------- LV E/e&', lateral                            7.31         --------- LV e&', medial                               10.1  cm/s   --------- LV E/e&', medial                             9.55         --------- LV e&', average                              11.65 cm/s   --------- LV E/e&', average                            8.28          ---------  Ventricular septum                          Value        Reference IVS thickness, ED                           10.2  mm     ---------  LVOT                                        Value        Reference LVOT ID, S                                  23    mm     --------- LVOT area                                   4.15  cm^2   --------- LVOT peak velocity, S                       73.9  cm/s   --------- LVOT mean velocity, S                       49.3  cm/s   --------- LVOT VTI, S                                 15.9  cm     ---------  Aorta                                       Value        Reference Aortic root ID, ED                          35    mm     ---------  Left atrium                                 Value        Reference LA ID, A-P, ES                              42    mm     --------- LA ID/bsa, A-P                              1.61  cm/m^2 <=2.2 LA volume, S                                79.1  ml     --------- LA volume/bsa, S                            30.3  ml/m^2 --------- LA volume, ES, 1-p A4C                      76    ml     --------- LA volume/bsa, ES, 1-p A4C                  29.1  ml/m^2 --------- LA volume, ES, 1-p A2C  79.3  ml     --------- LA volume/bsa, ES, 1-p A2C                  30.3  ml/m^2 ---------  Mitral valve                                Value        Reference Mitral E-wave peak velocity                 96.5  cm/s   --------- Mitral deceleration time                    183   ms     150 - 230 Mitral peak gradient, D                     4     mm Hg  ---------  Pulmonary arteries                          Value        Reference PA pressure, S, DP                          28    mm Hg  <=30  Tricuspid valve                             Value        Reference Tricuspid regurg peak velocity              251   cm/s   --------- Tricuspid peak RV-RA gradient               25    mm Hg  --------- Tricuspid maximal regurg  velocity,          251   cm/s   --------- PISA  Systemic veins                              Value        Reference Estimated CVP                               3     mm Hg  ---------  Right ventricle                             Value        Reference TAPSE                                       25.3  mm     --------- RV pressure, S, DP                          28    mm Hg  <=30 RV s&', lateral, S                           12.1  cm/s   ---------  Legend: (L)  and  (H)  mark values outside specified reference range.  ------------------------------------------------------------------- Prepared and Electronically Authenticated by  Lyman Bishop MD 2018-04-10T17:21:04    [place] 11/24/2018  Narrative  Atrial fibrillation predominates.  Rare instances of rapid ventricular response.  Frequent episodes of nocturnal pauses greater than 3 seconds. Longest episode greater than 4 seconds.  Atrial fib with nocturnal pauses possibly related to sleep apnea. Electrophysiology consult.            Recent Labs: 12/18/2021: ALT 18 10/09/2022: BUN 19; Creatinine, Ser 1.12; Hemoglobin 14.8; Platelets 164; Potassium 4.8; Sodium 133  Recent Lipid Panel    Component Value Date/Time   CHOL 124 12/18/2021 0958   CHOL 147 09/18/2020 0947   TRIG 101.0 12/18/2021 0958   HDL 44.50 12/18/2021 0958   HDL 45 09/18/2020 0947   CHOLHDL 3 12/18/2021 0958   VLDL 20.2 12/18/2021 0958   LDLCALC 60 12/18/2021 0958   LDLCALC 76 09/18/2020 0947   LDLDIRECT 107.0 11/27/2016 1733        Physical Exam:    VS:  BP 122/68   Pulse (!) 58   Ht 6' 1.5" (1.867 m)   Wt 266 lb (120.7 kg)   SpO2 100%   BMI 34.62 kg/m     Wt Readings from Last 3 Encounters:  12/05/22 266 lb (120.7 kg)  10/10/22 266 lb (120.7 kg)  10/09/22 270 lb (122.5 kg)    GEN:  Well nourished, well developed in no acute distress HEENT: Normal NECK: No JVD CARDIAC: IRIR bradycardia  no murmurs, rubs,  gallops RESPIRATORY:  Clear to auscultation without rales, wheezing or rhonchi  ABDOMEN: Soft, non-tender, non-distended MUSCULOSKELETAL:  No edema; No deformity  SKIN: Warm and dry NEUROLOGIC:  Alert and oriented x 3 PSYCHIATRIC:  Normal affect   ASSESSMENT:    1. Permanent atrial fibrillation ([place])   2. Coronary artery disease involving native coronary artery of native heart without angina pectoris   3. Essential hypertension, benign   4. OSA (obstructive sleep apnea)   5. Mixed hyperlipidemia    PLAN:    Permanent Atrial Fibrillation OSA on CPAP HTN with DM - CHADSVASC=4. - Continue anticoagulation with DOAC; Acquired Thrombophilia - TSH 2 - discussed alcohol, discussed exercise as preventive factors - Will get obtain TTE.   - Continue rate control with standing diltiazem, BB; can consolidate if one agent (will do this if he has more of the sinking issues which I attribute to low BP) - BP controled on ACEi - will stop PRN diltiazem -  Obstructive CAD with distant PCI to pLAD (2009) - LDL 70 on current therapy   One year me or APP      Medication Adjustments/Labs and Tests Ordered: Current medicines are reviewed at length with the patient today.  Concerns regarding medicines are outlined above.  No orders of the defined types were placed in this encounter.  No orders of the defined types were placed in this encounter.   There are no Patient Instructions on file for this visit.   Signed, Werner Lean, MD  12/05/2022 10:25 AM    [place]

## 2022-12-05 NOTE — Patient Instructions (Signed)
Medication Instructions:  Your physician has recommended you make the following change in your medication:  STOP: as needed Diltiazem (Cardizem)  *If you need a refill on your cardiac medications before your next appointment, please call your pharmacy*   Lab Work: NONE If you have labs (blood work) drawn today and your tests are completely normal, you will receive your results only by: [place] (if you have MyChart) OR A paper copy in the mail If you have any lab test that is abnormal or we need to change your treatment, we will call you to review the results.   Testing/Procedures: Your physician has requested that you have an echocardiogram. Echocardiography is a painless test that uses sound waves to create images of your heart. It provides your doctor with information about the size and shape of your heart and how well your heart's chambers and valves are working. This procedure takes approximately one hour. There are no restrictions for this procedure. Please do NOT wear cologne, perfume, aftershave, or lotions (deodorant is allowed). Please arrive 15 minutes prior to your appointment time.    Follow-Up: At [facility], you and your health needs are our priority.  As part of our continuing mission to provide you with exceptional heart care, we have created designated Provider Care Teams.  These Care Teams include your primary Cardiologist (physician) and Advanced Practice Providers (APPs -  Physician Assistants and Nurse Practitioners) who all work together to provide you with the care you need, when you need it.  Your next appointment:   1 year(s)  Provider:   Werner Lean, MD

## 2022-12-14 NOTE — Progress Notes (Deleted)
[place] at [facility] [address], [place], [place] [address] [phone] [phone]  Date:  12/22/2022   Name:  Arthur Rich   DOB:  1951-03-16   MRN:  [id number]  PCP:  [name], MD    Chief Complaint: No chief complaint on file.   History of Present Illness:  Arthur Rich is a 72 y.o. very pleasant male patient who presents with the following:  Patient seen today for a Medicare health maintenance visit Most recent visit with myself about 1 year ago- history of hypertension, CAD, atrial fib, diabetes, hyperlipidemia, sleep apnea, gout, TIA  He is always in a fib per his knowledge.  He is using his xarelto   Uses BiPAP for sleep apnea  Most recent visit with cardiology was earlier this month: 1. Permanent atrial fibrillation ([place])   2. Coronary artery disease involving native coronary artery of native heart without angina pectoris   3. Essential hypertension, benign   4. OSA (obstructive sleep apnea)   5. Mixed hyperlipidemia     PLAN:     Permanent Atrial Fibrillation OSA on CPAP HTN with DM - CHADSVASC=4. - Continue anticoagulation with DOAC; Acquired Thrombophilia - TSH 2 - discussed alcohol, discussed exercise as preventive factors - Will get obtain TTE.   - Continue rate control with standing diltiazem, BB; can consolidate if one agent (will do this if he has more of the sinking issues which I attribute to low BP) - BP controled on ACEi - will stop PRN diltiazem Obstructive CAD with distant PCI to pLAD (2009) - LDL 70 on current therapy  One year me or APP  S[facility] Medicare wellness Check urine microalbumin Foot exam is due Flu shot Check A1c Recommend COVID booster Eye exam   Labs done December-BMP, CBC  Taking allopurinol for gout prophylaxis Lipitor 80 Zetia 10 Lisinopril 40 Metformin 500 Toprol-XL Xarelto 20 daily Patient Active Problem List   Diagnosis Date Noted   Hemoptysis  07/12/2018   History of tobacco use 07/12/2018   Controlled type 2 diabetes mellitus without complication, without long-term current use of insulin ([place]) 11/28/2016   Obesity (BMI 30-39.9) 09/28/2015   OSA (obstructive sleep apnea) 04/10/2015   Excessive daytime sleepiness 04/10/2015   Permanent atrial fibrillation ([place]) 10/25/2014   Anticoagulation adequate with anticoagulant therapy 07/20/2013   Mixed hyperlipidemia 04/30/2010   Essential hypertension, benign 04/30/2010   Coronary artery disease involving native coronary artery of native heart without angina pectoris 04/30/2010   ATRIAL FLUTTER 04/30/2010    Past Medical History:  Diagnosis Date   Atrial flutter ([place])    Cancer ([place])    skin   Coronary artery disease involving native coronary artery of native heart without angina pectoris 04/30/2010   LHC 9/09:  S/p BMS to pLAD // Myoview 4/18: No ischemia, low risk, not gated   Diabetes mellitus without complication (HCC)    Hyperlipidemia    Hypertension    Obesity (BMI 30-39.9) 09/28/2015   Overweight(278.02)    Permanent atrial fibrillation ([place]) 10/25/2014   Echo 10/14: EF 55-65%, mild MR, mod LAE, PASP 31 mmHg // Holter 9/16:  AFib, avg HR 70, rare HR up to 150, rare brady with 35 bpm and 3 sec pause during HS // Echo 4/18: EF 55-60, no RWMA, mild LAE, moderate RAE, trivial TR, PASP 28   Syncope and collapse 03/29/2010   TIA (transient ischemic attack)     Past Surgical History:  Procedure Laterality Date   CARDIAC CATHETERIZATION  9/09   PCI LAD (BMS)   nasal septal surgery     TONSILLECTOMY     WISDOM TOOTH EXTRACTION      Social History   Tobacco Use   Smoking status: Former    Packs/day: 1.00    Years: 30.00    Total pack years: 30.00    Types: Cigarettes    Quit date: 08/26/1996    Years since quitting: 26.3   Smokeless tobacco: Never  Substance Use Topics   Alcohol use: Yes    Alcohol/week: 2.0 standard drinks of alcohol    Types: 2 Cans of beer per  week    Comment: previously heavy, now trying to cut back   Drug use: No    Family History  Problem Relation Age of Onset   Diabetes Father    Heart disease Father    Heart attack Maternal Uncle        early 45's   Cancer Maternal Grandmother        unknown ?leukemia    Allergies  Allergen Reactions   Penicillins Rash    Has patient had a PCN reaction causing immediate rash, facial/tongue/throat swelling, SOB or lightheadedness with hypotension: Yes Has patient had a PCN reaction causing severe rash involving mucus membranes or skin necrosis: No Has patient had a PCN reaction that required hospitalization: No Has patient had a PCN reaction occurring within the last 10 years: No If all of the above answers are "NO", then may proceed with Cephalosporin use.    Medication list has been reviewed and updated.  Current Outpatient Medications on File Prior to Visit  Medication Sig Dispense Refill   acetaminophen (TYLENOL) 500 MG tablet Take 500 mg by mouth every 6 (six) hours as needed for headache (pain).     allopurinol (ZYLOPRIM) 100 MG tablet TAKE 1 TABLET BY MOUTH EVERY DAY 90 tablet 3   atorvastatin (LIPITOR) 80 MG tablet Take 1 tablet (80 mg total) by mouth daily. 90 tablet 3   Cholecalciferol (VITAMIN D) 50 MCG (2000 UT) CAPS Take 2,000 Units by mouth daily.      diltiazem (CARDIZEM CD) 180 MG 24 hr capsule TAKE 1 CAPSULE BY MOUTH EVERY DAY 90 capsule 1   ezetimibe (ZETIA) 10 MG tablet TAKE 1 TABLET BY MOUTH EVERY DAY 90 tablet 0   lisinopril (ZESTRIL) 40 MG tablet TAKE 1 TABLET BY MOUTH EVERY DAY 90 tablet 2   metFORMIN (GLUCOPHAGE) 500 MG tablet Take 1 tablet (500 mg total) by mouth daily with supper. 90 tablet 3   metoprolol succinate (TOPROL-XL) 100 MG 24 hr tablet Take with or immediately following a meal. (Patient taking differently: 150 mg daily. Take '100mg'$  am, '50mg'$  pm..Take with or immediately following a meal.) 135 tablet 0   Polyethyl Glycol-Propyl Glycol (SYSTANE OP)  Place 1 drop into both eyes daily as needed (dry eyes).      XARELTO 20 MG TABS tablet TAKE 1 TABLET BY MOUTH DAILY WITH SUPPER 90 tablet 1   No current facility-administered medications on file prior to visit.    Review of Systems:  As per HPI- otherwise negative.   Physical Examination: There were no vitals filed for this visit. There were no vitals filed for this visit. There is no height or weight on file to calculate BMI. Ideal Body Weight:    GEN: no acute distress. HEENT: Atraumatic, Normocephalic.  Ears and Nose: No external deformity. CV: RRR, No M/G/R. No  JVD. No thrill. No extra heart sounds. PULM: CTA B, no wheezes, crackles, rhonchi. No retractions. No resp. distress. No accessory muscle use. ABD: S, NT, ND, +BS. No rebound. No HSM. EXTR: No c/c/e PSYCH: Normally interactive. Conversant.  Foot exam  Assessment and Plan: ***  Signed Lamar Blinks, MD

## 2022-12-22 ENCOUNTER — Encounter: Payer: Medicare Other | Admitting: Family Medicine

## 2022-12-22 DIAGNOSIS — I2510 Atherosclerotic heart disease of native coronary artery without angina pectoris: Secondary | ICD-10-CM

## 2022-12-22 DIAGNOSIS — Z125 Encounter for screening for malignant neoplasm of prostate: Secondary | ICD-10-CM

## 2022-12-22 DIAGNOSIS — E119 Type 2 diabetes mellitus without complications: Secondary | ICD-10-CM

## 2022-12-22 DIAGNOSIS — I4821 Permanent atrial fibrillation: Secondary | ICD-10-CM

## 2022-12-22 DIAGNOSIS — M1A0710 Idiopathic chronic gout, right ankle and foot, without tophus (tophi): Secondary | ICD-10-CM

## 2022-12-22 DIAGNOSIS — E782 Mixed hyperlipidemia: Secondary | ICD-10-CM

## 2022-12-22 DIAGNOSIS — I10 Essential (primary) hypertension: Secondary | ICD-10-CM

## 2022-12-24 ENCOUNTER — Other Ambulatory Visit: Payer: Self-pay | Admitting: Family Medicine

## 2022-12-24 DIAGNOSIS — E119 Type 2 diabetes mellitus without complications: Secondary | ICD-10-CM

## 2022-12-24 DIAGNOSIS — E782 Mixed hyperlipidemia: Secondary | ICD-10-CM

## 2022-12-30 ENCOUNTER — Other Ambulatory Visit: Payer: Self-pay

## 2022-12-30 MED ORDER — EZETIMIBE 10 MG PO TABS: 10.0000 mg | ORAL_TABLET | Freq: Every day | ORAL | 3 refills | Status: DC

## 2023-01-01 NOTE — Patient Instructions (Addendum)
It was good to see you again today, I will be in touch with your lab work You can get your shingles vaccine series at the pharmacy at your convenience - [facility]  Assuming all is well let's plan to visit in about 6 months

## 2023-01-01 NOTE — Progress Notes (Addendum)
[place] [facility], [place], [place] [address] [phone]  Date:  01/07/2023   Name:  Arthur Rich   DOB:  1951-05-02   MRN:  [id number]  PCP:  [name], MD    Chief Complaint: yearly OV (Concerns/ questions: Ova [place] MA, A1C due/Foot exam is due/Eye exam: longer than 1 year/Flu shot today: 07/2022/[facility]: to get at pharmacy)   History of Present Illness:  Arthur Rich is a 72 y.o. very pleasant male patient who presents with the following:  Patient seen today for a follow-up visit, Medicare Most recent visit with myself about 1 year ago- history of hypertension, CAD, atrial fib, diabetes, hyperlipidemia, sleep apnea, gout, TIA, CAD with PCI to LAD 2009 He is always in a fib per his knowledge.  He is using his xarelto daily and needs a refill  Uses BiPAP for sleep apnea  Most recent visit with cardiology February 16-continue anticoagulation, diltiazem for rate control He continues to be in constant a fib per his knowledge  Lab Results  Component Value Date   HGBA1C 6.7 (H) 12/18/2021   Allopurinol 100 daily Lipitor 80 Zetia 10 Diltiazem 180 daily Metoprolol Xarelto 20 mg daily Lisinopril 40 Metformin 500 daily  Labs on chart-BMP, CBC in December  Shingrix-recommended Urine micro- update today Foot exam- update today ?  Was flu shot done-yes, in the fall  Eye exam- recommended  He notes more bilateral foot neuropathy sx- numbness and tingling Not really painful Dx diabetes about 4 years ago but he had pre-diabetes for years prior  He notes he tends to go barefoot in the house quite a bit.  He had a small scrape on the dorsum of his right great toe perhaps 4 or 5 days ago. At this time he does not wish to start any medication for neuropathy Patient Active Problem List   Diagnosis Date Noted   Hemoptysis 07/12/2018   History of tobacco use 07/12/2018   Controlled type 2  diabetes mellitus without complication, without long-term current use of insulin ([place]) 11/28/2016   Obesity (BMI 30-39.9) 09/28/2015   OSA (obstructive sleep apnea) 04/10/2015   Excessive daytime sleepiness 04/10/2015   Permanent atrial fibrillation ([place]) 10/25/2014   Anticoagulation adequate with anticoagulant therapy 07/20/2013   Mixed hyperlipidemia 04/30/2010   Essential hypertension, benign 04/30/2010   Coronary artery disease involving native coronary artery of native heart without angina pectoris 04/30/2010   ATRIAL FLUTTER 04/30/2010    Past Medical History:  Diagnosis Date   Atrial flutter ([place])    Cancer ([place])    skin   Coronary artery disease involving native coronary artery of native heart without angina pectoris 04/30/2010   LHC 9/09:  S/p BMS to pLAD // Myoview 4/18: No ischemia, low risk, not gated   Diabetes mellitus without complication (HCC)    Hyperlipidemia    Hypertension    Obesity (BMI 30-39.9) 09/28/2015   Overweight(278.02)    Permanent atrial fibrillation ([place]) 10/25/2014   Echo 10/14: EF 55-65%, mild MR, mod LAE, PASP 31 mmHg // Holter 9/16:  AFib, avg HR 70, rare HR up to 150, rare brady with 35 bpm and 3 sec pause during HS // Echo 4/18: EF 55-60, no RWMA, mild LAE, moderate RAE, trivial TR, PASP 28   Syncope and collapse 03/29/2010   TIA (transient ischemic attack)     Past Surgical History:  Procedure Laterality Date   CARDIAC CATHETERIZATION  9/09   PCI LAD (BMS)   nasal septal surgery     TONSILLECTOMY     WISDOM TOOTH EXTRACTION      Social History   Tobacco Use   Smoking status: Former    Packs/day: 1.00    Years: 30.00    Additional pack years: 0.00    Total pack years: 30.00    Types: Cigarettes    Quit date: 08/26/1996    Years since quitting: 26.3   Smokeless tobacco: Never  Substance Use Topics   Alcohol use: Yes    Alcohol/week: 2.0 standard drinks of alcohol    Types: 2 Cans of beer per week    Comment: previously heavy,  now trying to cut back   Drug use: No    Family History  Problem Relation Age of Onset   Diabetes Father    Heart disease Father    Heart attack Maternal Uncle        early 65's   Cancer Maternal Grandmother        unknown ?leukemia    Allergies  Allergen Reactions   Penicillins Rash    Has patient had a PCN reaction causing immediate rash, facial/tongue/throat swelling, SOB or lightheadedness with hypotension: Yes Has patient had a PCN reaction causing severe rash involving mucus membranes or skin necrosis: No Has patient had a PCN reaction that required hospitalization: No Has patient had a PCN reaction occurring within the last 10 years: No If all of the above answers are "NO", then may proceed with Cephalosporin use.    Medication list has been reviewed and updated.  Current Outpatient Medications on File Prior to Visit  Medication Sig Dispense Refill   acetaminophen (TYLENOL) 500 MG tablet Take 500 mg by mouth every 6 (six) hours as needed for headache (pain).     allopurinol (ZYLOPRIM) 100 MG tablet TAKE 1 TABLET BY MOUTH EVERY DAY 90 tablet 3   atorvastatin (LIPITOR) 80 MG tablet TAKE 1 TABLET BY MOUTH EVERY DAY 90 tablet 1   Cholecalciferol (VITAMIN D) 50 MCG (2000 UT) CAPS Take 2,000 Units by mouth daily.      diltiazem (CARDIZEM CD) 180 MG 24 hr capsule TAKE 1 CAPSULE BY MOUTH EVERY DAY 90 capsule 1   ezetimibe (ZETIA) 10 MG tablet Take 1 tablet (10 mg total) by mouth daily. 90 tablet 3   lisinopril (ZESTRIL) 40 MG tablet TAKE 1 TABLET BY MOUTH EVERY DAY 90 tablet 2   metFORMIN (GLUCOPHAGE) 500 MG tablet TAKE 1 TABLET BY MOUTH DAILY WITH SUPPER. 90 tablet 1   metoprolol succinate (TOPROL-XL) 100 MG 24 hr tablet Take with or immediately following a meal. (Patient taking differently: 150 mg daily. Take 100mg  am, 50mg  pm..Take with or immediately following a meal.) 135 tablet 0   Polyethyl Glycol-Propyl Glycol (SYSTANE OP) Place 1 drop into both eyes daily as needed (dry  eyes).      XARELTO 20 MG TABS tablet TAKE 1 TABLET BY MOUTH DAILY WITH SUPPER 90 tablet 1   No current facility-administered medications on file prior to visit.    Review of Systems:  As per HPI- otherwise negative.   Physical Examination: Vitals:   01/07/23 0930  BP: 120/70  Pulse: (!) 57  Resp: 18  Temp: 97.7 F (36.5 C)  SpO2: 97%   Vitals:   01/07/23 0930  Weight: 269 lb 9.6 oz (122.3 kg)  Height: 6' 1.5" (1.867 m)   Body mass index is 35.09 kg/m. Ideal Body Weight:  Weight in (lb) to have BMI = 25: 191.7  GEN: no acute distress. Obese, looks well  HEENT: Atraumatic, Normocephalic.  Bilateral TM wnl, oropharynx normal.  PEERL,EOMI.   Ears and Nose: No external deformity. CV: Rate controlled atrial fibrillation, No M/G/R. No JVD. No thrill. No extra heart sounds. PULM: CTA B, no wheezes, crackles, rhonchi. No retractions. No resp. distress. No accessory muscle use. ABD: S, NT, ND, +BS. No rebound. No HSM. He has a significant umbilical hernia which I can reduce when he is supine and relaxed  EXTR: No c/c/e PSYCH: Normally interactive. Conversant.  Foot exam- normal pulses, feet are somewhat smooth and shiny  There is a tiny scrape on the dorsum of the right great toe -this does not appear infected, expect will heal normally  Assessment and Plan: Permanent atrial fibrillation ([place]) - Plan: CBC, Comprehensive metabolic panel, rivaroxaban (XARELTO) 20 MG TABS tablet  ESSENTIAL HYPERTENSION, BENIGN  Controlled type 2 diabetes mellitus without complication, without long-term current use of insulin ([place]) - Plan: Comprehensive metabolic panel, Hemoglobin A1c, Microalbumin / creatinine urine ratio  Mixed hyperlipidemia - Plan: Lipid panel  Coronary artery disease involving native coronary artery of native heart without angina pectoris  Screening for prostate cancer - Plan: PSA, Medicare ( [place] only)  Chronic idiopathic gout involving toe of right foot  without tophus - Plan: allopurinol (ZYLOPRIM) 100 MG tablet, Uric acid  Patient seen today for follow-up.  Blood pressure under good control.  Permanent atrial fibrillation is being treated appropriately We will follow-up on his A1c today, urine micro PSA screening performed He is on allopurinol for gout, check uric acid We discussed his umbilical hernia.  It does not bother him or cause any pain, he does not wish to have it surgically repaired at this time.  I did counsel him to seek care right away if it ever should become significantly painful and he is not able to reduce it easily Also, I encouraged him to wear shoes to protect his feet Signed Lamar Blinks, MD  Received labs as below, message to patient  Results for orders placed or performed in visit on 01/07/23  CBC  Result Value Ref Range   WBC 6.7 4.0 - 10.5 K/uL   RBC 4.69 4.22 - 5.81 Mil/uL   Platelets 194.0 150.0 - 400.0 K/uL   Hemoglobin 14.8 13.0 - 17.0 g/dL   HCT 43.5 39.0 - 52.0 %   MCV 92.7 78.0 - 100.0 fl   MCHC 33.9 30.0 - 36.0 g/dL   RDW 13.8 11.5 - 15.5 %  Comprehensive metabolic panel  Result Value Ref Range   Sodium 137 135 - 145 mEq/L   Potassium 4.7 3.5 - 5.1 mEq/L   Chloride 102 96 - 112 mEq/L   CO2 27 19 - 32 mEq/L   Glucose, Bld 130 (H) 70 - 99 mg/dL   BUN 14 6 - 23 mg/dL   Creatinine, Ser 0.94 0.40 - 1.50 mg/dL   Total Bilirubin 1.7 (H) 0.2 - 1.2 mg/dL   Alkaline Phosphatase 78 39 - 117 U/L   AST 21 0 - 37 U/L   ALT 17 0 - 53 U/L   Total Protein 7.0 6.0 - 8.3 g/dL   Albumin 4.1 3.5 - 5.2 g/dL   GFR 81.62 >60.00 mL/min   Calcium 9.4 8.4 - 10.5 mg/dL  Hemoglobin A1c  Result Value Ref Range   Hgb A1c MFr Bld 6.8 (H) 4.6 - 6.5 %  Lipid panel  Result Value  Ref Range   Cholesterol 134 0 - 200 mg/dL   Triglycerides 120.0 0.0 - 149.0 mg/dL   HDL 51.20 >39.00 mg/dL   VLDL 24.0 0.0 - 40.0 mg/dL   LDL Cholesterol 59 0 - 99 mg/dL   Total CHOL/HDL Ratio 3    NonHDL 83.25   Microalbumin /  creatinine urine ratio  Result Value Ref Range   Microalb, Ur 0.7 0.0 - 1.9 mg/dL   Creatinine,U 77.3 mg/dL   Microalb Creat Ratio 0.9 0.0 - 30.0 mg/g  PSA, Medicare ( [place] only)  Result Value Ref Range   PSA 0.49 0.10 - 4.00 ng/ml  Uric acid  Result Value Ref Range   Uric Acid, Serum 5.2 4.0 - 7.8 mg/dL

## 2023-01-05 ENCOUNTER — Ambulatory Visit (HOSPITAL_COMMUNITY): Payer: Medicare Other | Attending: Internal Medicine

## 2023-01-05 DIAGNOSIS — I2510 Atherosclerotic heart disease of native coronary artery without angina pectoris: Secondary | ICD-10-CM | POA: Diagnosis not present

## 2023-01-05 DIAGNOSIS — I10 Essential (primary) hypertension: Secondary | ICD-10-CM | POA: Insufficient documentation

## 2023-01-05 DIAGNOSIS — I4821 Permanent atrial fibrillation: Secondary | ICD-10-CM | POA: Insufficient documentation

## 2023-01-05 LAB — ECHOCARDIOGRAM COMPLETE
Est EF: 55
S' Lateral: 2.9 cm

## 2023-01-07 ENCOUNTER — Encounter: Payer: Self-pay | Admitting: Family Medicine

## 2023-01-07 ENCOUNTER — Ambulatory Visit (INDEPENDENT_AMBULATORY_CARE_PROVIDER_SITE_OTHER): Payer: Medicare Other | Admitting: Family Medicine

## 2023-01-07 VITALS — BP 120/70 | HR 57 | Temp 97.7°F | Resp 18 | Ht 73.5 in | Wt 269.6 lb

## 2023-01-07 DIAGNOSIS — I10 Essential (primary) hypertension: Secondary | ICD-10-CM

## 2023-01-07 DIAGNOSIS — E119 Type 2 diabetes mellitus without complications: Secondary | ICD-10-CM | POA: Diagnosis not present

## 2023-01-07 DIAGNOSIS — I2510 Atherosclerotic heart disease of native coronary artery without angina pectoris: Secondary | ICD-10-CM

## 2023-01-07 DIAGNOSIS — M1A0710 Idiopathic chronic gout, right ankle and foot, without tophus (tophi): Secondary | ICD-10-CM | POA: Diagnosis not present

## 2023-01-07 DIAGNOSIS — Z125 Encounter for screening for malignant neoplasm of prostate: Secondary | ICD-10-CM | POA: Diagnosis not present

## 2023-01-07 DIAGNOSIS — E782 Mixed hyperlipidemia: Secondary | ICD-10-CM | POA: Diagnosis not present

## 2023-01-07 DIAGNOSIS — I4821 Permanent atrial fibrillation: Secondary | ICD-10-CM | POA: Diagnosis not present

## 2023-01-07 LAB — MICROALBUMIN / CREATININE URINE RATIO
Creatinine,U: 77.3 mg/dL
Microalb Creat Ratio: 0.9 mg/g (ref 0.0–30.0)
Microalb, Ur: 0.7 mg/dL (ref 0.0–1.9)

## 2023-01-07 LAB — LIPID PANEL
Cholesterol: 134 mg/dL (ref 0–200)
HDL: 51.2 mg/dL (ref >39.00)
LDL Cholesterol: 59 mg/dL (ref 0–99)
NonHDL: 83.25
Total CHOL/HDL Ratio: 3
Triglycerides: 120 mg/dL (ref 0.0–149.0)
VLDL: 24 mg/dL (ref 0.0–40.0)

## 2023-01-07 LAB — COMPREHENSIVE METABOLIC PANEL
ALT: 17 U/L (ref 0–53)
AST: 21 U/L (ref 0–37)
Albumin: 4.1 g/dL (ref 3.5–5.2)
Alkaline Phosphatase: 78 U/L (ref 39–117)
BUN: 14 mg/dL (ref 6–23)
CO2: 27 mEq/L (ref 19–32)
Calcium: 9.4 mg/dL (ref 8.4–10.5)
Chloride: 102 mEq/L (ref 96–112)
Creatinine, Ser: 0.94 mg/dL (ref 0.40–1.50)
GFR: 81.62 mL/min (ref >60.00)
Glucose, Bld: 130 mg/dL — ABNORMAL HIGH (ref 70–99)
Potassium: 4.7 mEq/L (ref 3.5–5.1)
Sodium: 137 mEq/L (ref 135–145)
Total Bilirubin: 1.7 mg/dL — ABNORMAL HIGH (ref 0.2–1.2)
Total Protein: 7 g/dL (ref 6.0–8.3)

## 2023-01-07 LAB — CBC
HCT: 43.5 % (ref 39.0–52.0)
Hemoglobin: 14.8 g/dL (ref 13.0–17.0)
MCHC: 33.9 g/dL (ref 30.0–36.0)
MCV: 92.7 fl (ref 78.0–100.0)
Platelets: 194 10*3/uL (ref 150.0–400.0)
RBC: 4.69 Mil/uL (ref 4.22–5.81)
RDW: 13.8 % (ref 11.5–15.5)
WBC: 6.7 10*3/uL (ref 4.0–10.5)

## 2023-01-07 LAB — URIC ACID: Uric Acid, Serum: 5.2 mg/dL (ref 4.0–7.8)

## 2023-01-07 LAB — PSA, MEDICARE: PSA: 0.49 ng/ml (ref 0.10–4.00)

## 2023-01-07 LAB — HEMOGLOBIN A1C: Hgb A1c MFr Bld: 6.8 % — ABNORMAL HIGH (ref 4.6–6.5)

## 2023-01-07 MED ORDER — RIVAROXABAN 20 MG PO TABS: 20.0000 mg | ORAL_TABLET | Freq: Every day | ORAL | 3 refills | Status: DC

## 2023-01-07 MED ORDER — ALLOPURINOL 100 MG PO TABS: 100.0000 mg | ORAL_TABLET | Freq: Every day | ORAL | 3 refills | Status: DC

## 2023-02-02 ENCOUNTER — Other Ambulatory Visit: Payer: Self-pay | Admitting: Internal Medicine

## 2023-02-10 ENCOUNTER — Telehealth: Payer: Self-pay | Admitting: Internal Medicine

## 2023-02-10 DIAGNOSIS — I4821 Permanent atrial fibrillation: Secondary | ICD-10-CM

## 2023-02-10 MED ORDER — RIVAROXABAN 20 MG PO TABS: 20.0000 mg | ORAL_TABLET | Freq: Every day | ORAL | 1 refills | Status: DC

## 2023-02-10 NOTE — Telephone Encounter (Signed)
*  STAT* If patient is at the pharmacy, call can be transferred to refill team.   1. Which medications need to be refilled? (please list name of each medication and dose if known) rivaroxaban (XARELTO) 20 MG TABS tablet   2. Which pharmacy/location (including street and city if local pharmacy) is medication to be sent to? [facility] pharmacy phone # [phone] Fax # [phone]  3. Do they need a 30 day or 90 day supply? 30 day

## 2023-02-10 NOTE — Addendum Note (Signed)
Addended by: Cheree Ditto on: 02/10/2023 12:54 PM   Modules accepted: Orders

## 2023-03-10 ENCOUNTER — Other Ambulatory Visit: Payer: Self-pay | Admitting: Student

## 2023-05-08 ENCOUNTER — Other Ambulatory Visit: Payer: Self-pay | Admitting: Internal Medicine

## 2023-05-08 DIAGNOSIS — I4821 Permanent atrial fibrillation: Secondary | ICD-10-CM

## 2023-05-08 NOTE — Telephone Encounter (Signed)
Xarelto 20mg  refill request received. Pt is 72 years old, weight-122.3kg, Crea-0.94 on 01/07/23, last seen by Dr. Izora Ribas on 12/05/22, Diagnosis-Afib, CrCl-124.69 mL/min; Dose is appropriate based on dosing criteria. Will send in refill to requested pharmacy.

## 2023-05-13 ENCOUNTER — Other Ambulatory Visit: Payer: Self-pay

## 2023-05-13 DIAGNOSIS — I4821 Permanent atrial fibrillation: Secondary | ICD-10-CM

## 2023-05-13 MED ORDER — RIVAROXABAN 20 MG PO TABS: 20.0000 mg | ORAL_TABLET | Freq: Every day | ORAL | 3 refills | Status: DC

## 2023-05-31 ENCOUNTER — Other Ambulatory Visit: Payer: Self-pay | Admitting: Student

## 2023-06-12 ENCOUNTER — Other Ambulatory Visit: Payer: Self-pay | Admitting: Student

## 2023-06-17 ENCOUNTER — Other Ambulatory Visit: Payer: Self-pay | Admitting: Family Medicine

## 2023-06-17 DIAGNOSIS — E119 Type 2 diabetes mellitus without complications: Secondary | ICD-10-CM

## 2023-06-17 DIAGNOSIS — E782 Mixed hyperlipidemia: Secondary | ICD-10-CM

## 2023-06-26 ENCOUNTER — Other Ambulatory Visit: Payer: Self-pay | Admitting: Family Medicine

## 2023-06-26 DIAGNOSIS — I10 Essential (primary) hypertension: Secondary | ICD-10-CM

## 2023-07-10 ENCOUNTER — Other Ambulatory Visit: Payer: Self-pay | Admitting: Family Medicine

## 2023-07-10 DIAGNOSIS — E782 Mixed hyperlipidemia: Secondary | ICD-10-CM

## 2023-07-10 DIAGNOSIS — E119 Type 2 diabetes mellitus without complications: Secondary | ICD-10-CM

## 2023-07-14 ENCOUNTER — Other Ambulatory Visit: Payer: Self-pay | Admitting: Family Medicine

## 2023-07-14 DIAGNOSIS — E119 Type 2 diabetes mellitus without complications: Secondary | ICD-10-CM

## 2023-07-14 DIAGNOSIS — E782 Mixed hyperlipidemia: Secondary | ICD-10-CM

## 2023-07-23 ENCOUNTER — Encounter: Payer: Self-pay | Admitting: Family Medicine

## 2023-07-23 ENCOUNTER — Ambulatory Visit (INDEPENDENT_AMBULATORY_CARE_PROVIDER_SITE_OTHER): Payer: Medicare Other | Admitting: Family Medicine

## 2023-07-23 VITALS — BP 138/75 | HR 68 | Temp 97.8°F | Resp 18 | Ht 73.5 in | Wt 272.0 lb

## 2023-07-23 DIAGNOSIS — I10 Essential (primary) hypertension: Secondary | ICD-10-CM

## 2023-07-23 DIAGNOSIS — Z7984 Long term (current) use of oral hypoglycemic drugs: Secondary | ICD-10-CM

## 2023-07-23 DIAGNOSIS — Z23 Encounter for immunization: Secondary | ICD-10-CM | POA: Diagnosis not present

## 2023-07-23 DIAGNOSIS — E119 Type 2 diabetes mellitus without complications: Secondary | ICD-10-CM

## 2023-07-23 DIAGNOSIS — Z1211 Encounter for screening for malignant neoplasm of colon: Secondary | ICD-10-CM

## 2023-07-23 DIAGNOSIS — I4821 Permanent atrial fibrillation: Secondary | ICD-10-CM | POA: Diagnosis not present

## 2023-07-23 DIAGNOSIS — E782 Mixed hyperlipidemia: Secondary | ICD-10-CM | POA: Diagnosis not present

## 2023-07-23 LAB — BASIC METABOLIC PANEL
BUN: 16 mg/dL (ref 6–23)
CO2: 26 meq/L (ref 19–32)
Calcium: 9.5 mg/dL (ref 8.4–10.5)
Chloride: 104 meq/L (ref 96–112)
Creatinine, Ser: 0.99 mg/dL (ref 0.40–1.50)
GFR: 76.4 mL/min (ref >60.00)
Glucose, Bld: 150 mg/dL — ABNORMAL HIGH (ref 70–99)
Potassium: 4.8 meq/L (ref 3.5–5.1)
Sodium: 138 meq/L (ref 135–145)

## 2023-07-23 LAB — HEMOGLOBIN A1C: Hgb A1c MFr Bld: 7.1 % — ABNORMAL HIGH (ref 4.6–6.5)

## 2023-07-23 LAB — CBC
HCT: 45.2 % (ref 39.0–52.0)
Hemoglobin: 15.1 g/dL (ref 13.0–17.0)
MCHC: 33.5 g/dL (ref 30.0–36.0)
MCV: 92.3 fL (ref 78.0–100.0)
Platelets: 205 10*3/uL (ref 150.0–400.0)
RBC: 4.89 Mil/uL (ref 4.22–5.81)
RDW: 14.4 % (ref 11.5–15.5)
WBC: 7.8 10*3/uL (ref 4.0–10.5)

## 2023-07-23 MED ORDER — ATORVASTATIN CALCIUM 80 MG PO TABS
80.0000 mg | ORAL_TABLET | Freq: Every day | ORAL | 3 refills | Status: DC
Start: 2023-07-23 — End: 2024-07-18

## 2023-07-23 MED ORDER — METFORMIN HCL 500 MG PO TABS
500.0000 mg | ORAL_TABLET | Freq: Every day | ORAL | 3 refills | Status: DC
Start: 2023-07-23 — End: 2024-08-04

## 2023-07-23 NOTE — Progress Notes (Signed)
[facility] [address], [address] [place], [place] [address] [phone]  Date:  07/23/2023   Name:  Arthur Rich   DOB:  08/20/1951   MRN:  [id number]  PCP:  Pearline Cables, MD    Chief Complaint: 6 month follow up (Concerns/ questions: none/AWV due/Eye exam: none recent /Cologuard due/A1C due/Flu shot today: yes)   History of Present Illness:  Arthur Rich is a 72 y.o. very pleasant male patient who presents with the following:  Pt seen today for 6 month recheck Flu shot today Last seen by myself in March -history of hypertension, CAD, atrial fib, diabetes on metformin, hyperlipidemia, sleep apnea, gout, TIA, CAD with PCI to LAD 2009 He is always in a fib per his knowledge.  He is using his xarelto daily   Uses BiPAP for sleep apnea Last seen by cardiology in February   He got braces 2 months ago- the plan is for about one year of treatment   Eye exam- this is due, he will set it up Cologuard due- ordered  Flu shot to be given today Recommend covid booster Shingrix- recommended   Lab Results  Component Value Date   HGBA1C 6.8 (H) 01/07/2023     Patient Active Problem List   Diagnosis Date Noted   Hemoptysis 07/12/2018   History of tobacco use 07/12/2018   Controlled type 2 diabetes mellitus without complication, without long-term current use of insulin (HCC) 11/28/2016   Obesity (BMI 30-39.9) 09/28/2015   OSA (obstructive sleep apnea) 04/10/2015   Excessive daytime sleepiness 04/10/2015   Permanent atrial fibrillation (HCC) 10/25/2014   Anticoagulation adequate with anticoagulant therapy 07/20/2013   Mixed hyperlipidemia 04/30/2010   Essential hypertension, benign 04/30/2010   Coronary artery disease involving native coronary artery of native heart without angina pectoris 04/30/2010   ATRIAL FLUTTER 04/30/2010    Past Medical History:  Diagnosis Date   Atrial flutter (HCC)    Cancer (HCC)     skin   Coronary artery disease involving native coronary artery of native heart without angina pectoris 04/30/2010   LHC 9/09:  S/p BMS to pLAD // Myoview 4/18: No ischemia, low risk, not gated   Diabetes mellitus without complication (HCC)    Hyperlipidemia    Hypertension    Obesity (BMI 30-39.9) 09/28/2015   Overweight(278.02)    Permanent atrial fibrillation (HCC) 10/25/2014   Echo 10/14: EF 55-65%, mild MR, mod LAE, PASP 31 mmHg // Holter 9/16:  AFib, avg HR 70, rare HR up to 150, rare brady with 35 bpm and 3 sec pause during HS // Echo 4/18: EF 55-60, no RWMA, mild LAE, moderate RAE, trivial TR, PASP 28   Syncope and collapse 03/29/2010   TIA (transient ischemic attack)     Past Surgical History:  Procedure Laterality Date   CARDIAC CATHETERIZATION  9/09   PCI LAD (BMS)   nasal septal surgery     TONSILLECTOMY     WISDOM TOOTH EXTRACTION      Social History   Tobacco Use   Smoking status: Former    Current packs/day: 0.00    Average packs/day: 1 pack/day for 30.0 years (30.0 ttl pk-yrs)    Types: Cigarettes    Start date: 08/26/1966    Quit date: 08/26/1996    Years since quitting: 26.9   Smokeless tobacco: Never  Substance Use Topics   Alcohol use: Yes    Alcohol/week: 2.0 standard drinks  of alcohol    Types: 2 Cans of beer per week    Comment: previously heavy, now trying to cut back   Drug use: No    Family History  Problem Relation Age of Onset   Diabetes Father    Heart disease Father    Heart attack Maternal Uncle        early 59's   Cancer Maternal Grandmother        unknown ?leukemia    Allergies  Allergen Reactions   Penicillins Rash    Has patient had a PCN reaction causing immediate rash, facial/tongue/throat swelling, SOB or lightheadedness with hypotension: Yes Has patient had a PCN reaction causing severe rash involving mucus membranes or skin necrosis: No Has patient had a PCN reaction that required hospitalization: No Has patient had a  PCN reaction occurring within the last 10 years: No If all of the above answers are "NO", then may proceed with Cephalosporin use.    Medication list has been reviewed and updated.  Current Outpatient Medications on File Prior to Visit  Medication Sig Dispense Refill   acetaminophen (TYLENOL) 500 MG tablet Take 500 mg by mouth every 6 (six) hours as needed for headache (pain).     allopurinol (ZYLOPRIM) 100 MG tablet Take 1 tablet (100 mg total) by mouth daily. 90 tablet 3   Cholecalciferol (VITAMIN D) 50 MCG (2000 UT) CAPS Take 2,000 Units by mouth daily.      diltiazem (CARDIZEM CD) 180 MG 24 hr capsule TAKE 1 CAPSULE BY MOUTH EVERY DAY 90 capsule 3   ezetimibe (ZETIA) 10 MG tablet Take 1 tablet (10 mg total) by mouth daily. 90 tablet 3   lisinopril (ZESTRIL) 40 MG tablet TAKE 1 TABLET BY MOUTH EVERY DAY 90 tablet 2   metoprolol succinate (TOPROL-XL) 100 MG 24 hr tablet Take 1.5 tablets (150 mg total) by mouth daily. Take 100mg  am, 50mg  pm..Take with or immediately following a meal. 135 tablet 3   Polyethyl Glycol-Propyl Glycol (SYSTANE OP) Place 1 drop into both eyes daily as needed (dry eyes).      rivaroxaban (XARELTO) 20 MG TABS tablet Take 1 tablet (20 mg total) by mouth daily with supper. 30 tablet 3   No current facility-administered medications on file prior to visit.    Review of Systems:  As per HPI- otherwise negative.   Physical Examination: Vitals:   07/23/23 1048 07/23/23 1105  BP: (!) 144/80 138/75  Pulse: 68   Resp: 18   Temp: 97.8 F (36.6 C)   SpO2: 98%    Vitals:   07/23/23 1048  Weight: 272 lb (123.4 kg)  Height: 6' 1.5" (1.867 m)   Body mass index is 35.4 kg/m. Ideal Body Weight: Weight in (lb) to have BMI = 25: 191.7  GEN: no acute distress.  Tall build, obese.  Otherwise looks well HEENT: Atraumatic, Normocephalic.   Ears and Nose: No external deformity. CV: RRR-he may currently be in sinus rhythm, No M/G/R. No JVD. No thrill. No extra heart  sounds. PULM: CTA B, no wheezes, crackles, rhonchi. No retractions. No resp. distress. No accessory muscle use. ABD: S, NT, ND, +BS. No rebound. No HSM. EXTR: No c/c/e PSYCH: Normally interactive. Conversant.     Assessment and Plan: Controlled type 2 diabetes mellitus without complication, without long-term current use of insulin (HCC) - Plan: Hemoglobin A1c, metFORMIN (GLUCOPHAGE) 500 MG tablet  Immunization due - Plan: Flu Vaccine Trivalent High Dose (Fluad)  ESSENTIAL HYPERTENSION, BENIGN - Plan:  CBC, Basic metabolic panel  Screening for colon cancer - Plan: Cologuard  Mixed hyperlipidemia - Plan: atorvastatin (LIPITOR) 80 MG tablet  Permanent atrial fibrillation [facility])  Patient seen today for follow-up.  A1c pending to monitor diabetes, refilled metformin Get flu shot today, recommend COVID booster and shingles vaccine.  He plans to get these done Blood pressure under reasonable control, he is now seeing cardiology just annually. He is in persistent atrial fibrillation, rate controlled and he is consistent with his Xarelto.  He denies any abnormal bleeding  Order Cologuard  Signed Abbe Amsterdam, MD  Received labs as below, message to patient  Results for orders placed or performed in visit on 07/23/23  CBC  Result Value Ref Range   WBC 7.8 4.0 - 10.5 K/uL   RBC 4.89 4.22 - 5.81 Mil/uL   Platelets 205.0 150.0 - 400.0 K/uL   Hemoglobin 15.1 13.0 - 17.0 g/dL   HCT 09.8 11.9 - 14.7 %   MCV 92.3 78.0 - 100.0 fl   MCHC 33.5 30.0 - 36.0 g/dL   RDW 82.9 56.2 - 13.0 %  Basic metabolic panel  Result Value Ref Range   Sodium 138 135 - 145 mEq/L   Potassium 4.8 3.5 - 5.1 mEq/L   Chloride 104 96 - 112 mEq/L   CO2 26 19 - 32 mEq/L   Glucose, Bld 150 (H) 70 - 99 mg/dL   BUN 16 6 - 23 mg/dL   Creatinine, Ser 8.65 0.40 - 1.50 mg/dL   GFR 78.46 >96.29 mL/min   Calcium 9.5 8.4 - 10.5 mg/dL  Hemoglobin B2W  Result Value Ref Range   Hgb A1c MFr Bld 7.1 (H) 4.6 - 6.5 %

## 2023-07-23 NOTE — Patient Instructions (Signed)
It was great to see you today, I will be in touch with your lab work ASAP  We will get a Cologuard kit sent to your home  Flu shot today, recommend COVID booster, shingles vaccine series at your convenience  Assuming all is well, please see me in about 6 months  Please schedule an appointment with your eye doctor

## 2023-08-12 ENCOUNTER — Other Ambulatory Visit: Payer: Self-pay | Admitting: Internal Medicine

## 2023-08-12 DIAGNOSIS — I4821 Permanent atrial fibrillation: Secondary | ICD-10-CM

## 2023-08-12 NOTE — Telephone Encounter (Signed)
Prescription refill request for Xarelto received.  Indication: AF Last office visit: Weight: 120.7kg Age: 72 Scr: 0.99 on 07/23/23  Epic CrCl: 115.15  Based on above findings Xarelto 20mg  daily is the appropriate dose.  Refill approved.

## 2023-08-31 DIAGNOSIS — Z1211 Encounter for screening for malignant neoplasm of colon: Secondary | ICD-10-CM | POA: Diagnosis not present

## 2023-09-08 LAB — COLOGUARD: COLOGUARD: NEGATIVE

## 2023-09-09 ENCOUNTER — Encounter: Payer: Self-pay | Admitting: Family Medicine

## 2023-12-01 ENCOUNTER — Other Ambulatory Visit: Payer: Self-pay | Admitting: *Deleted

## 2023-12-01 ENCOUNTER — Telehealth: Payer: Self-pay | Admitting: Internal Medicine

## 2023-12-01 DIAGNOSIS — I4821 Permanent atrial fibrillation: Secondary | ICD-10-CM

## 2023-12-01 MED ORDER — RIVAROXABAN 20 MG PO TABS: 20.0000 mg | ORAL_TABLET | Freq: Every day | ORAL | 1 refills | Status: DC

## 2023-12-01 NOTE — Telephone Encounter (Signed)
Xarelto 20mg  refill request received. Pt is 73 years old, weight-123.4kg, Crea-0.99 on 07/23/23, last seen by Dr. Izora Ribas on 12/05/22, Diagnosis-Afib, CrCl-117.72 mL/min; Dose is appropriate based on dosing criteria. Will send in refill to requested pharmacy.

## 2023-12-01 NOTE — Telephone Encounter (Signed)
*  STAT* If patient is at the pharmacy, call can be transferred to refill team.   1. Which medications need to be refilled? (please list name of each medication and dose if known)   rivaroxaban (XARELTO) 20 MG TABS tablet    2. Which pharmacy/location (including street and city if local pharmacy) is medication to be sent to? [id number] - [place], [place] - [address]   3. Do they need a 30 day or 90 day supply? 90

## 2024-01-01 ENCOUNTER — Ambulatory Visit: Payer: Medicare (Managed Care) | Admitting: *Deleted

## 2024-01-01 VITALS — BP 145/80 | HR 68 | Ht 73.5 in | Wt 272.2 lb

## 2024-01-01 DIAGNOSIS — I10 Essential (primary) hypertension: Secondary | ICD-10-CM

## 2024-01-01 DIAGNOSIS — Z0000 Encounter for general adult medical examination without abnormal findings: Secondary | ICD-10-CM

## 2024-01-01 MED ORDER — LISINOPRIL 40 MG PO TABS: 40.0000 mg | ORAL_TABLET | Freq: Every day | ORAL | 2 refills | Status: DC

## 2024-01-01 NOTE — Progress Notes (Signed)
I have reviewed and agree with [facility] documentation.  Kathlene November, MD

## 2024-01-01 NOTE — Progress Notes (Signed)
 Subjective:   Arthur Rich is a 73 y.o. male who presents for Medicare Annual/Subsequent preventive examination.  Visit Complete: In person  Cardiac Risk Factors include: advanced age (>73men, >80 women);diabetes mellitus;dyslipidemia;male gender;hypertension;obesity (BMI >30kg/m2)     Objective:    Today's Vitals   01/01/24 0906 01/01/24 0927  BP: (!) 150/87 (!) 145/80  Pulse: 68   Weight: 272 lb 3.2 oz (123.5 kg)   Height: 6' 1.5" (1.867 m)    Body mass index is 35.43 kg/m.     01/01/2024    9:19 AM 08/05/2020    7:59 PM 01/09/2020    2:10 PM 09/25/2018    6:43 PM 04/09/2018    8:38 PM 07/01/2015    8:38 PM 04/05/2015    8:13 PM  Advanced Directives  Does Patient Have a Medical Advance Directive? Yes Yes Yes No No No No  Type of Advance Directive Living will Living will Healthcare Power of Box;Living will      Does patient want to make changes to medical advance directive? No - Patient declined No - Patient declined No - Patient declined      Copy of Healthcare Power of Attorney in Chart?   No - copy requested      Would patient like information on creating a medical advance directive?    No - Patient declined  No - patient declined information No - patient declined information    Current Medications (verified) Outpatient Encounter Medications as of 01/01/2024  Medication Sig   acetaminophen (TYLENOL) 500 MG tablet Take 500 mg by mouth every 6 (six) hours as needed for headache (pain).   allopurinol (ZYLOPRIM) 100 MG tablet Take 1 tablet (100 mg total) by mouth daily.   atorvastatin (LIPITOR) 80 MG tablet Take 1 tablet (80 mg total) by mouth daily.   Cholecalciferol (VITAMIN D) 50 MCG (2000 UT) CAPS Take 2,000 Units by mouth daily.    diltiazem (CARDIZEM CD) 180 MG 24 hr capsule TAKE 1 CAPSULE BY MOUTH EVERY DAY   ezetimibe (ZETIA) 10 MG tablet Take 1 tablet (10 mg total) by mouth daily.   lisinopril (ZESTRIL) 40 MG tablet Take 1 tablet (40 mg total) by mouth  daily.   metFORMIN (GLUCOPHAGE) 500 MG tablet Take 1 tablet (500 mg total) by mouth daily with supper.   metoprolol succinate (TOPROL-XL) 100 MG 24 hr tablet Take 1.5 tablets (150 mg total) by mouth daily. Take 100mg  am, 50mg  pm..Take with or immediately following a meal.   Polyethyl Glycol-Propyl Glycol (SYSTANE OP) Place 1 drop into both eyes daily as needed (dry eyes).    rivaroxaban (XARELTO) 20 MG TABS tablet Take 1 tablet (20 mg total) by mouth daily with supper.   [DISCONTINUED] lisinopril (ZESTRIL) 40 MG tablet TAKE 1 TABLET BY MOUTH EVERY DAY   No facility-administered encounter medications on file as of 01/01/2024.    Allergies (verified) Penicillins   History: Past Medical History:  Diagnosis Date   Atrial flutter (HCC)    Cancer (HCC)    skin   Coronary artery disease involving native coronary artery of native heart without angina pectoris 04/30/2010   LHC 9/09:  S/p BMS to pLAD // Myoview 4/18: No ischemia, low risk, not gated   Diabetes mellitus without complication (HCC)    Hyperlipidemia    Hypertension    Obesity (BMI 30-39.9) 09/28/2015   Overweight(278.02)    Permanent atrial fibrillation (HCC) 10/25/2014   Echo 10/14: EF 55-65%, mild MR, mod LAE, PASP 31  mmHg // Holter 9/16:  AFib, avg HR 70, rare HR up to 150, rare brady with 35 bpm and 3 sec pause during HS // Echo 4/18: EF 55-60, no RWMA, mild LAE, moderate RAE, trivial TR, PASP 28   Sleep apnea    Syncope and collapse 03/29/2010   TIA (transient ischemic attack)    Past Surgical History:  Procedure Laterality Date   CARDIAC CATHETERIZATION  9/09   PCI LAD (BMS)   nasal septal surgery     TONSILLECTOMY     WISDOM TOOTH EXTRACTION     Family History  Problem Relation Age of Onset   Diabetes Father    Heart disease Father    Early death Father    Arthritis Mother    Hearing loss Mother    Heart attack Maternal Uncle        early 40's   Cancer Maternal Grandmother        unknown ?leukemia    Social History   Socioeconomic History   Marital status: Single    Spouse name: Not on file   Number of children: Not on file   Years of education: Not on file   Highest education level: Master's degree (e.g., MA, MS, MEng, MEd, MSW, MBA)  Occupational History   Not on file  Tobacco Use   Smoking status: Former    Current packs/day: 0.00    Average packs/day: 1 pack/day for 30.0 years (30.0 ttl pk-yrs)    Types: Cigarettes    Start date: 08/26/1966    Quit date: 08/26/1996    Years since quitting: 27.3   Smokeless tobacco: Never  Substance and Sexual Activity   Alcohol use: Yes    Alcohol/week: 6.0 standard drinks of alcohol    Types: 6 Cans of beer per week    Comment: previously heavy, now trying to cut back   Drug use: No   Sexual activity: Not Currently  Other Topics Concern   Not on file  Social History Narrative   Not on file   Social Drivers of Health   Financial Resource Strain: Low Risk  (12/29/2023)   Overall Financial Resource Strain (CARDIA)    Difficulty of Paying Living Expenses: Not hard at all  Food Insecurity: No Food Insecurity (12/29/2023)   Hunger Vital Sign    Worried About Running Out of Food in the Last Year: Never true    Ran Out of Food in the Last Year: Never true  Transportation Needs: No Transportation Needs (12/29/2023)   PRAPARE - Administrator, Civil Service (Medical): No    Lack of Transportation (Non-Medical): No  Physical Activity: Insufficiently Active (12/29/2023)   Exercise Vital Sign    Days of Exercise per Week: 4 days    Minutes of Exercise per Session: 30 min  Stress: No Stress Concern Present (12/29/2023)   [employer] of Occupational Health - Occupational Stress Questionnaire    Feeling of Stress : Only a little  Social Connections: Moderately Isolated (12/29/2023)   Social Connection and Isolation Panel [NHANES]    Frequency of Communication with Friends and Family: More than three times a week     Frequency of Social Gatherings with Friends and Family: Once a week    Attends Religious Services: 1 to 4 times per year    Active Member of [employer] or Organizations: No    Attends Banker Meetings: Not on file    Marital Status: Never married    Tobacco Counseling  Counseling given: Not Answered   Clinical Intake:  Pre-visit preparation completed: Yes  Pain : No/denies pain  BMI - recorded: 35.43 Nutritional Status: BMI > 30  Obese Nutritional Risks: None Diabetes: Yes CBG done?: No Did pt. bring in CBG monitor from home?: No  How often do you need to have someone help you when you read instructions, pamphlets, or other written materials from your doctor or pharmacy?: 1 - Never  Interpreter Needed?: No  Information entered by :: Donne Anon, CMA   Activities of Daily Living    01/01/2024    9:10 AM  In your present state of health, do you have any difficulty performing the following activities:  Hearing? 0  Vision? 0  Difficulty concentrating or making decisions? 0  Walking or climbing stairs? 1  Dressing or bathing? 0  Doing errands, shopping? 0  Preparing Food and eating ? N  Using the Toilet? N  In the past six months, have you accidently leaked urine? N  Do you have problems with loss of bowel control? N  Managing your Medications? N  Managing your Finances? N  Housekeeping or managing your Housekeeping? N    Patient Care Team: Copland, Gwenlyn Found, MD as PCP - General (Family Medicine) Hillis Range, MD (Inactive) as PCP - Electrophysiology (Cardiology) Christell Constant, MD as PCP - Cardiology (Cardiology) Hillis Range, MD (Inactive) as Attending Physician (Cardiology) Marcille Buffy [facility])  Indicate any recent Medical Services you may have received from other than [facility] providers in the past year (date may be approximate).     Assessment:   This is a routine wellness examination for Bernd.  Hearing/Vision screen No results  found.   Goals Addressed   None    Depression Screen    01/01/2024    9:20 AM 01/07/2023    9:34 AM 12/18/2021    9:31 AM 01/30/2021    8:24 AM 01/09/2020    2:14 PM 07/30/2017   10:59 AM  PHQ 2/9 Scores  PHQ - 2 Score 0 0 0 0 0 0    Fall Risk    01/01/2024    9:19 AM 01/07/2023    9:34 AM 12/18/2021    9:31 AM 01/30/2021    8:22 AM 01/09/2020    2:14 PM  Fall Risk   Falls in the past year? 0 0 0 0 0  Number falls in past yr: 0 0 0 0 0  Injury with Fall? 0 0 0 0 0  Risk for fall due to : No Fall Risks No Fall Risks     Follow up Falls evaluation completed Falls evaluation completed   Education provided;Falls prevention discussed    MEDICARE RISK AT HOME: Medicare Risk at Home Any stairs in or around the home?: No If so, are there any without handrails?: No Home free of loose throw rugs in walkways, pet beds, electrical cords, etc?: Yes Adequate lighting in your home to reduce risk of falls?: Yes Life alert?: No Use of a cane, walker or w/c?: No Grab bars in the bathroom?: Yes Shower chair or bench in shower?: No Elevated toilet seat or a handicapped toilet?: No  TIMED UP AND GO:  Was the test performed?  Yes  Length of time to ambulate 10 feet: 6 sec Gait steady and fast without use of assistive device    Cognitive Function:        01/01/2024    9:21 AM  6CIT Screen  What Year? 0 points  What month? 0 points  What time? 0 points  Count back from 20 0 points  Months in reverse 0 points  Repeat phrase 0 points  Total Score 0 points    Immunizations Immunization History  Administered Date(s) Administered   Fluad Trivalent(High Dose 65+) 07/23/2023   Influenza Split 09/19/2016   Influenza, High Dose Seasonal PF 07/30/2017, 07/14/2019   Influenza,inj,Quad PF,6+ Mos 07/12/2018   PFIZER(Purple Top)SARS-COV-2 Vaccination 11/21/2019, 12/10/2019, 08/20/2020   Pfizer Covid-19 Vaccine Bivalent Booster 5y-11y 07/24/2021   Pneumococcal Conjugate-13 11/27/2016    Pneumococcal Polysaccharide-23 10/04/2018   Tdap 11/27/2016   Zoster, Live 12/25/2023    TDAP status: Up to date  Flu Vaccine status: Up to date  Pneumococcal vaccine status: Up to date  Covid-19 vaccine status: Information provided on how to obtain vaccines.   Qualifies for Shingles Vaccine? Yes   Zostavax completed Yes   Shingrix Completed?: No.    Education has been provided regarding the importance of this vaccine. Patient has been advised to call insurance company to determine out of pocket expense if they have not yet received this vaccine. Advised may also receive vaccine at local pharmacy or Health Dept. Verbalized acceptance and understanding.  Screening Tests Health Maintenance  Topic Date Due   Zoster Vaccines- Shingrix (1 of 2) 08/10/2001   Medicare Annual Wellness (AWV)  01/08/2021   OPHTHALMOLOGY EXAM  12/04/2022   COVID-19 Vaccine (5 - 2024-25 season) 06/21/2023   Diabetic kidney evaluation - Urine ACR  01/07/2024   FOOT EXAM  01/07/2024   HEMOGLOBIN A1C  01/21/2024   Diabetic kidney evaluation - eGFR measurement  07/22/2024   Fecal DNA (Cologuard)  08/30/2026   DTaP/Tdap/Td (2 - Td or Tdap) 11/27/2026   Pneumonia Vaccine 19+ Years old  Completed   INFLUENZA VACCINE  Completed   Hepatitis C Screening  Completed   HPV VACCINES  Aged Out   Colonoscopy  Discontinued    Health Maintenance  Health Maintenance Due  Topic Date Due   Zoster Vaccines- Shingrix (1 of 2) 08/10/2001   Medicare Annual Wellness (AWV)  01/08/2021   OPHTHALMOLOGY EXAM  12/04/2022   COVID-19 Vaccine (5 - 2024-25 season) 06/21/2023   Diabetic kidney evaluation - Urine ACR  01/07/2024    Colorectal cancer screening: Type of screening: Cologuard. Completed 08/31/23. Repeat every 3 years  Lung Cancer Screening: (Low Dose CT Chest recommended if Age 53-80 years, 20 pack-year currently smoking OR have quit w/in 15years.) does not qualify.   Additional Screening:  Hepatitis C Screening:  does qualify; Completed 11/27/16  Vision Screening: Recommended annual ophthalmology exams for early detection of glaucoma and other disorders of the eye. Is the patient up to date with their annual eye exam?  No  will call today to schedule  Who is the provider or what is the name of the office in which the patient attends annual eye exams? Dr. Martha Clan If pt is not established with a provider, would they like to be referred to a provider to establish care? No .   Dental Screening: Recommended annual dental exams for proper oral hygiene  Diabetic Foot Exam: Diabetic Foot Exam: Completed 01/07/23  Community Resource Referral / Chronic Care Management: CRR required this visit?  No   CCM required this visit?  No     Plan:     I have personally reviewed and noted the following in the patient's chart:   Medical and social history Use of alcohol, tobacco or illicit drugs  Current medications  and supplements including opioid prescriptions. Patient is not currently taking opioid prescriptions. Functional ability and status Nutritional status Physical activity Advanced directives List of other physicians Hospitalizations, surgeries, and ER visits in previous 12 months Vitals Screenings to include cognitive, depression, and falls Referrals and appointments  In addition, I have reviewed and discussed with patient certain preventive protocols, quality metrics, and best practice recommendations. A written personalized care plan for preventive services as well as general preventive health recommendations were provided to patient.     Donne Anon, CMA   01/01/2024   After Visit Summary: (In Person-Declined) Patient declined AVS at this time.  Nurse Notes: None

## 2024-01-01 NOTE — Patient Instructions (Signed)
 Mr. Davidow , Thank you for taking time to come for your Medicare Wellness Visit. I appreciate your ongoing commitment to your health goals. Please review the following plan we discussed and let me know if I can assist you in the future.     This is a list of the screening recommended for you and due dates:  Health Maintenance  Topic Date Due   Zoster (Shingles) Vaccine (1 of 2) 08/10/2001   Eye exam for diabetics  12/04/2022   COVID-19 Vaccine (5 - 2024-25 season) 06/21/2023   Yearly kidney health urinalysis for diabetes  01/07/2024   Complete foot exam   01/07/2024   Hemoglobin A1C  01/21/2024   Yearly kidney function blood test for diabetes  07/22/2024   Medicare Annual Wellness Visit  12/31/2024   Cologuard (Stool DNA test)  08/30/2026   DTaP/Tdap/Td vaccine (2 - Td or Tdap) 11/27/2026   Pneumonia Vaccine  Completed   Flu Shot  Completed   Hepatitis C Screening  Completed   HPV Vaccine  Aged Out   Colon Cancer Screening  Discontinued    Next appointment: Follow up in one year for your annual wellness visit.   Preventive Care 30 Years and Older, Male Preventive care refers to lifestyle choices and visits with your health care provider that can promote health and wellness. What does preventive care include? A yearly physical exam. This is also called an annual well check. Dental exams once or twice a year. Routine eye exams. Ask your health care provider how often you should have your eyes checked. Personal lifestyle choices, including: Daily care of your teeth and gums. Regular physical activity. Eating a healthy diet. Avoiding tobacco and drug use. Limiting alcohol use. Practicing safe sex. Taking low doses of aspirin every day. Taking vitamin and mineral supplements as recommended by your health care provider. What happens during an annual well check? The services and screenings done by your health care provider during your annual well check will depend on your age,  overall health, lifestyle risk factors, and family history of disease. Counseling  Your health care provider may ask you questions about your: Alcohol use. Tobacco use. Drug use. Emotional well-being. Home and relationship well-being. Sexual activity. Eating habits. History of falls. Memory and ability to understand (cognition). Work and work Astronomer. Screening  You may have the following tests or measurements: Height, weight, and BMI. Blood pressure. Lipid and cholesterol levels. These may be checked every 5 years, or more frequently if you are over 42 years old. Skin check. Lung cancer screening. You may have this screening every year starting at age 72 if you have a 30-pack-year history of smoking and currently smoke or have quit within the past 15 years. Fecal occult blood test (FOBT) of the stool. You may have this test every year starting at age 53. Flexible sigmoidoscopy or colonoscopy. You may have a sigmoidoscopy every 5 years or a colonoscopy every 10 years starting at age 55. Prostate cancer screening. Recommendations will vary depending on your family history and other risks. Hepatitis C blood test. Hepatitis B blood test. Sexually transmitted disease (STD) testing. Diabetes screening. This is done by checking your blood sugar (glucose) after you have not eaten for a while (fasting). You may have this done every 1-3 years. Abdominal aortic aneurysm (AAA) screening. You may need this if you are a current or former smoker. Osteoporosis. You may be screened starting at age 76 if you are at high risk. Talk with your  health care provider about your test results, treatment options, and if necessary, the need for more tests. Vaccines  Your health care provider may recommend certain vaccines, such as: Influenza vaccine. This is recommended every year. Tetanus, diphtheria, and acellular pertussis (Tdap, Td) vaccine. You may need a Td booster every 10 years. Zoster vaccine.  You may need this after age 4. Pneumococcal 13-valent conjugate (PCV13) vaccine. One dose is recommended after age 42. Pneumococcal polysaccharide (PPSV23) vaccine. One dose is recommended after age 24. Talk to your health care provider about which screenings and vaccines you need and how often you need them. This information is not intended to replace advice given to you by your health care provider. Make sure you discuss any questions you have with your health care provider. Document Released: 11/02/2015 Document Revised: 06/25/2016 Document Reviewed: 08/07/2015 Elsevier Interactive Patient Education  2017 [employer].  Fall Prevention in the Home Falls can cause injuries. They can happen to people of all ages. There are many things you can do to make your home safe and to help prevent falls. What can I do on the outside of my home? Regularly fix the edges of walkways and driveways and fix any cracks. Remove anything that might make you trip as you walk through a door, such as a raised step or threshold. Trim any bushes or trees on the path to your home. Use bright outdoor lighting. Clear any walking paths of anything that might make someone trip, such as rocks or tools. Regularly check to see if handrails are loose or broken. Make sure that both sides of any steps have handrails. Any raised decks and porches should have guardrails on the edges. Have any leaves, snow, or ice cleared regularly. Use sand or salt on walking paths during winter. Clean up any spills in your garage right away. This includes oil or grease spills. What can I do in the bathroom? Use night lights. Install grab bars by the toilet and in the tub and shower. Do not use towel bars as grab bars. Use non-skid mats or decals in the tub or shower. If you need to sit down in the shower, use a plastic, non-slip stool. Keep the floor dry. Clean up any water that spills on the floor as soon as it happens. Remove soap  buildup in the tub or shower regularly. Attach bath mats securely with double-sided non-slip rug tape. Do not have throw rugs and other things on the floor that can make you trip. What can I do in the bedroom? Use night lights. Make sure that you have a light by your bed that is easy to reach. Do not use any sheets or blankets that are too big for your bed. They should not hang down onto the floor. Have a firm chair that has side arms. You can use this for support while you get dressed. Do not have throw rugs and other things on the floor that can make you trip. What can I do in the kitchen? Clean up any spills right away. Avoid walking on wet floors. Keep items that you use a lot in easy-to-reach places. If you need to reach something above you, use a strong step stool that has a grab bar. Keep electrical cords out of the way. Do not use floor polish or wax that makes floors slippery. If you must use wax, use non-skid floor wax. Do not have throw rugs and other things on the floor that can make you trip. What  can I do with my stairs? Do not leave any items on the stairs. Make sure that there are handrails on both sides of the stairs and use them. Fix handrails that are broken or loose. Make sure that handrails are as long as the stairways. Check any carpeting to make sure that it is firmly attached to the stairs. Fix any carpet that is loose or worn. Avoid having throw rugs at the top or bottom of the stairs. If you do have throw rugs, attach them to the floor with carpet tape. Make sure that you have a light switch at the top of the stairs and the bottom of the stairs. If you do not have them, ask someone to add them for you. What else can I do to help prevent falls? Wear shoes that: Do not have high heels. Have rubber bottoms. Are comfortable and fit you well. Are closed at the toe. Do not wear sandals. If you use a stepladder: Make sure that it is fully opened. Do not climb a closed  stepladder. Make sure that both sides of the stepladder are locked into place. Ask someone to hold it for you, if possible. Clearly mark and make sure that you can see: Any grab bars or handrails. First and last steps. Where the edge of each step is. Use tools that help you move around (mobility aids) if they are needed. These include: Canes. Walkers. Scooters. Crutches. Turn on the lights when you go into a dark area. Replace any light bulbs as soon as they burn out. Set up your furniture so you have a clear path. Avoid moving your furniture around. If any of your floors are uneven, fix them. If there are any pets around you, be aware of where they are. Review your medicines with your doctor. Some medicines can make you feel dizzy. This can increase your chance of falling. Ask your doctor what other things that you can do to help prevent falls. This information is not intended to replace advice given to you by your health care provider. Make sure you discuss any questions you have with your health care provider. Document Released: 08/02/2009 Document Revised: 03/13/2016 Document Reviewed: 11/10/2014 Elsevier Interactive Patient Education  2017 [employer].

## 2024-01-02 ENCOUNTER — Other Ambulatory Visit: Payer: Self-pay | Admitting: Internal Medicine

## 2024-01-25 ENCOUNTER — Other Ambulatory Visit: Payer: Self-pay | Admitting: Family Medicine

## 2024-01-25 ENCOUNTER — Other Ambulatory Visit: Payer: Self-pay | Admitting: Internal Medicine

## 2024-01-25 DIAGNOSIS — M1A0710 Idiopathic chronic gout, right ankle and foot, without tophus (tophi): Secondary | ICD-10-CM

## 2024-01-27 ENCOUNTER — Other Ambulatory Visit: Payer: Self-pay | Admitting: Internal Medicine

## 2024-02-02 ENCOUNTER — Other Ambulatory Visit: Payer: Self-pay | Admitting: Internal Medicine

## 2024-02-03 ENCOUNTER — Other Ambulatory Visit: Payer: Self-pay | Admitting: Internal Medicine

## 2024-02-05 ENCOUNTER — Other Ambulatory Visit: Payer: Self-pay | Admitting: Internal Medicine

## 2024-02-05 NOTE — Progress Notes (Unsigned)
 [facility] [address] [place], [place] [address] [phone]  Date:  02/10/2024   Name:  Arthur Rich   DOB:  1950-11-16   MRN:  [id number]  PCP:  Kaylee Partridge, MD    Chief Complaint: No chief complaint on file.   History of Present Illness:  Arthur Rich is a 73 y.o. very pleasant male patient who presents with the following:  Pt seen today for a periodic recheck Last seen by myself 6 months ago - history of hypertension, CAD, atrial fib, diabetes on metformin , hyperlipidemia, sleep apnea, gout, TIA, CAD with PCI to LAD 2009 He is always in a fib per his knowledge.  He is using his xarelto  daily  Shingrix Eye exam Covid booster recommended Urine micro due Foot exam Labs/ A1c can be updated   Lipitor Zetia  Dilt 180 Lisinopril  40 Toprol  xl 100 Metformin  500 daily Xarelto     Patient Active Problem List   Diagnosis Date Noted   Hemoptysis 07/12/2018   History of tobacco use 07/12/2018   Controlled type 2 diabetes mellitus without complication, without long-term current use of insulin (HCC) 11/28/2016   Obesity (BMI 30-39.9) 09/28/2015   OSA (obstructive sleep apnea) 04/10/2015   Excessive daytime sleepiness 04/10/2015   Permanent atrial fibrillation (HCC) 10/25/2014   Anticoagulation adequate with anticoagulant therapy 07/20/2013   Mixed hyperlipidemia 04/30/2010   Essential hypertension, benign 04/30/2010   Coronary artery disease involving native coronary artery of native heart without angina pectoris 04/30/2010   ATRIAL FLUTTER 04/30/2010    Past Medical History:  Diagnosis Date   Atrial flutter (HCC)    Cancer (HCC)    skin   Coronary artery disease involving native coronary artery of native heart without angina pectoris 04/30/2010   LHC 9/09:  S/p BMS to pLAD // Myoview  4/18: No ischemia, low risk, not gated   Diabetes mellitus without complication (HCC)    Hyperlipidemia     Hypertension    Obesity (BMI 30-39.9) 09/28/2015   Overweight(278.02)    Permanent atrial fibrillation (HCC) 10/25/2014   Echo 10/14: EF 55-65%, mild MR, mod LAE, PASP 31 mmHg // Holter 9/16:  AFib, avg HR 70, rare HR up to 150, rare brady with 35 bpm and 3 sec pause during HS // Echo 4/18: EF 55-60, no RWMA, mild LAE, moderate RAE, trivial TR, PASP 28   Sleep apnea    Syncope and collapse 03/29/2010   TIA (transient ischemic attack)     Past Surgical History:  Procedure Laterality Date   CARDIAC CATHETERIZATION  9/09   PCI LAD (BMS)   nasal septal surgery     TONSILLECTOMY     WISDOM TOOTH EXTRACTION      Social History   Tobacco Use   Smoking status: Former    Current packs/day: 0.00    Average packs/day: 1 pack/day for 30.0 years (30.0 ttl pk-yrs)    Types: Cigarettes    Start date: 08/26/1966    Quit date: 08/26/1996    Years since quitting: 27.4   Smokeless tobacco: Never  Substance Use Topics   Alcohol use: Yes    Alcohol/week: 6.0 standard drinks of alcohol    Types: 6 Cans of beer per week    Comment: previously heavy, now trying to cut back   Drug use: No    Family History  Problem Relation Age of Onset   Diabetes Father    Heart disease  Father    Early death Father    Arthritis Mother    Hearing loss Mother    Heart attack Maternal Uncle        early 28's   Cancer Maternal Grandmother        unknown ?leukemia    Allergies  Allergen Reactions   Penicillins Rash    Has patient had a PCN reaction causing immediate rash, facial/tongue/throat swelling, SOB or lightheadedness with hypotension: Yes Has patient had a PCN reaction causing severe rash involving mucus membranes or skin necrosis: No Has patient had a PCN reaction that required hospitalization: No Has patient had a PCN reaction occurring within the last 10 years: No If all of the above answers are "NO", then may proceed with Cephalosporin use.    Medication list has been reviewed and  updated.  Current Outpatient Medications on File Prior to Visit  Medication Sig Dispense Refill   acetaminophen (TYLENOL) 500 MG tablet Take 500 mg by mouth every 6 (six) hours as needed for headache (pain).     allopurinol  (ZYLOPRIM ) 100 MG tablet TAKE 1 TABLET BY MOUTH EVERY DAY 90 tablet 3   atorvastatin  (LIPITOR) 80 MG tablet Take 1 tablet (80 mg total) by mouth daily. 90 tablet 3   Cholecalciferol (VITAMIN D ) 50 MCG (2000 UT) CAPS Take 2,000 Units by mouth daily.      diltiazem  (CARDIZEM  CD) 180 MG 24 hr capsule TAKE 1 CAPSULE BY MOUTH EVERY DAY 90 capsule 3   ezetimibe  (ZETIA ) 10 MG tablet Take 1 tablet (10 mg total) by mouth daily. Please keep upcoming July appt for further refills. Thank you 90 tablet 0   lisinopril  (ZESTRIL ) 40 MG tablet Take 1 tablet (40 mg total) by mouth daily. 90 tablet 2   metFORMIN  (GLUCOPHAGE ) 500 MG tablet Take 1 tablet (500 mg total) by mouth daily with supper. 90 tablet 3   metoprolol  succinate (TOPROL -XL) 100 MG 24 hr tablet TAKE 1.5 TABLETS (150 MG TOTAL) BY MOUTH DAILY. TAKE 1 TABLET EVERY MORNING, 1/2 TABLET EVERY EVENING .TAKE WITH OR IMMEDIATELY FOLLOWING A MEAL. 135 tablet 0   Polyethyl Glycol-Propyl Glycol (SYSTANE OP) Place 1 drop into both eyes daily as needed (dry eyes).      rivaroxaban  (XARELTO ) 20 MG TABS tablet Take 1 tablet (20 mg total) by mouth daily with supper. 90 tablet 1   No current facility-administered medications on file prior to visit.    Review of Systems:  As per HPI- otherwise negative.   Physical Examination: There were no vitals filed for this visit. There were no vitals filed for this visit. There is no height or weight on file to calculate BMI. Ideal Body Weight:    GEN: no acute distress. HEENT: Atraumatic, Normocephalic.  Ears and Nose: No external deformity. CV: RRR, No M/G/R. No JVD. No thrill. No extra heart sounds. PULM: CTA B, no wheezes, crackles, rhonchi. No retractions. No resp. distress. No accessory  muscle use. ABD: S, NT, ND, +BS. No rebound. No HSM. EXTR: No c/c/e PSYCH: Normally interactive. Conversant.  Foot exam  Assessment and Plan: ***  Signed Gates Kasal, MD

## 2024-02-05 NOTE — Patient Instructions (Incomplete)
 Good to see you again today Recommend shingles vaccine series if not done, and covid booster if none in the last 6 months or so/ 2 doses of the most recent shot

## 2024-02-08 ENCOUNTER — Ambulatory Visit: Admitting: Family Medicine

## 2024-02-10 ENCOUNTER — Ambulatory Visit (INDEPENDENT_AMBULATORY_CARE_PROVIDER_SITE_OTHER): Payer: Medicare (Managed Care) | Admitting: Family Medicine

## 2024-02-10 ENCOUNTER — Encounter: Payer: Self-pay | Admitting: Family Medicine

## 2024-02-10 VITALS — BP 112/60 | HR 67 | Temp 97.7°F | Resp 18 | Ht 73.5 in | Wt 274.4 lb

## 2024-02-10 DIAGNOSIS — Z125 Encounter for screening for malignant neoplasm of prostate: Secondary | ICD-10-CM | POA: Diagnosis not present

## 2024-02-10 DIAGNOSIS — E119 Type 2 diabetes mellitus without complications: Secondary | ICD-10-CM

## 2024-02-10 DIAGNOSIS — M1A0710 Idiopathic chronic gout, right ankle and foot, without tophus (tophi): Secondary | ICD-10-CM

## 2024-02-10 DIAGNOSIS — G63 Polyneuropathy in diseases classified elsewhere: Secondary | ICD-10-CM | POA: Diagnosis not present

## 2024-02-10 DIAGNOSIS — I10 Essential (primary) hypertension: Secondary | ICD-10-CM

## 2024-02-10 DIAGNOSIS — E782 Mixed hyperlipidemia: Secondary | ICD-10-CM | POA: Diagnosis not present

## 2024-02-10 DIAGNOSIS — I4821 Permanent atrial fibrillation: Secondary | ICD-10-CM | POA: Diagnosis not present

## 2024-02-10 DIAGNOSIS — E889 Metabolic disorder, unspecified: Secondary | ICD-10-CM

## 2024-02-10 LAB — MICROALBUMIN / CREATININE URINE RATIO
Creatinine,U: 57.6 mg/dL
Microalb Creat Ratio: UNDETERMINED mg/g (ref 0.0–30.0)
Microalb, Ur: <0.7 mg/dL

## 2024-02-10 LAB — CBC
HCT: 43.4 % (ref 39.0–52.0)
Hemoglobin: 14.7 g/dL (ref 13.0–17.0)
MCHC: 33.8 g/dL (ref 30.0–36.0)
MCV: 92.6 fl (ref 78.0–100.0)
Platelets: 179 10*3/uL (ref 150.0–400.0)
RBC: 4.69 Mil/uL (ref 4.22–5.81)
RDW: 14 % (ref 11.5–15.5)
WBC: 6.3 10*3/uL (ref 4.0–10.5)

## 2024-02-10 LAB — PSA: PSA: 0.52 ng/mL (ref 0.10–4.00)

## 2024-02-10 LAB — COMPREHENSIVE METABOLIC PANEL WITH GFR
ALT: 15 U/L (ref 0–53)
AST: 23 U/L (ref 0–37)
Albumin: 4.3 g/dL (ref 3.5–5.2)
Alkaline Phosphatase: 70 U/L (ref 39–117)
BUN: 16 mg/dL (ref 6–23)
CO2: 24 meq/L (ref 19–32)
Calcium: 9.3 mg/dL (ref 8.4–10.5)
Chloride: 104 meq/L (ref 96–112)
Creatinine, Ser: 0.94 mg/dL (ref 0.40–1.50)
GFR: 80.99 mL/min (ref >60.00)
Glucose, Bld: 145 mg/dL — ABNORMAL HIGH (ref 70–99)
Potassium: 4.5 meq/L (ref 3.5–5.1)
Sodium: 136 meq/L (ref 135–145)
Total Bilirubin: 2.2 mg/dL — ABNORMAL HIGH (ref 0.2–1.2)
Total Protein: 7 g/dL (ref 6.0–8.3)

## 2024-02-10 LAB — VITAMIN B12: Vitamin B-12: 245 pg/mL (ref 211–911)

## 2024-02-10 LAB — LIPID PANEL
Cholesterol: 122 mg/dL (ref 0–200)
HDL: 47.6 mg/dL (ref >39.00)
LDL Cholesterol: 53 mg/dL (ref 0–99)
NonHDL: 74.17
Total CHOL/HDL Ratio: 3
Triglycerides: 104 mg/dL (ref 0.0–149.0)
VLDL: 20.8 mg/dL (ref 0.0–40.0)

## 2024-02-10 LAB — FOLATE: Folate: 11.9 ng/mL (ref >5.9)

## 2024-02-10 LAB — FERRITIN: Ferritin: 244.7 ng/mL (ref 22.0–322.0)

## 2024-02-10 LAB — HEMOGLOBIN A1C: Hgb A1c MFr Bld: 7 % — ABNORMAL HIGH (ref 4.6–6.5)

## 2024-05-03 ENCOUNTER — Ambulatory Visit: Payer: Medicare (Managed Care) | Attending: Internal Medicine | Admitting: Internal Medicine

## 2024-05-03 ENCOUNTER — Encounter: Payer: Self-pay | Admitting: Internal Medicine

## 2024-05-03 VITALS — BP 132/83 | HR 66 | Ht 73.5 in | Wt 268.0 lb

## 2024-05-03 DIAGNOSIS — I10 Essential (primary) hypertension: Secondary | ICD-10-CM

## 2024-05-03 DIAGNOSIS — I4821 Permanent atrial fibrillation: Secondary | ICD-10-CM

## 2024-05-03 DIAGNOSIS — I2510 Atherosclerotic heart disease of native coronary artery without angina pectoris: Secondary | ICD-10-CM | POA: Diagnosis not present

## 2024-05-03 NOTE — Progress Notes (Signed)
 Cardiology Office Note:    Date:  05/03/2024   ID:  Arthur Rich, DOB 1951-08-24, MRN [id number]  PCP:  Watt Harlene BROCKS, MD   [facility] Providers Cardiologist:  Stanly DELENA Leavens, MD Electrophysiologist:  Lynwood Rakers, MD (Inactive)     Referring MD: Watt Harlene BROCKS, MD   CC: CAD f/u  History of Present Illness:    Arthur Rich is a 73 y.o. male with a hx of Permanent AF, OSA (onCPAP) HTN with DM, Obstructive CAD with distant PCI to pLAD (2009). 2023.  Asymptomatic doing well with Dr. Claudene and with EP. 2024: Hypotension and ED visit  Arthur Rich Chyrl is a 73 year old male with hypertension, diabetes, coronary artery disease, and permanent atrial fibrillation who presents for follow-up after an episode of hypotension.  He has a history of permanent atrial fibrillation and recently experienced a 'sinking feeling' consistent with hypotension, which led to an emergency department evaluation. Since adjusting his medications, his blood pressure has normalized, with occasional high readings but generally well-controlled. He feels good consistently, with very few episodes of the 'sinking feeling' over the past year.  He has a history of coronary artery disease with a prior PCI to the proximal LAD in 2009. No recent chest pain and no further stenting required since then. He is on atorvastatin  80 mg and Zetia  for cholesterol management.  He has diabetes and reports a loss of feeling in his feet, which he attributes to his diabetes. This symptom has become more than a nuisance, affecting his quality of life, particularly when driving. He is trying to manage this by increasing his walking, which seems to help.  He is on CPAP therapy for obstructive sleep apnea. He is also taking diltiazem , metoprolol , and lisinopril  for blood pressure and heart rate control.  In terms of social history, he is trying to walk more regularly, often choosing to walk in  air-conditioned environments like grocery stores to avoid the heat. He finds this routine beneficial for his overall health.  No recent chest pain or breathing issues. He reports a loss of feeling in his feet, which he attributes to his diabetes.  Past Medical History:  Diagnosis Date   Atrial flutter (HCC)    Cancer (HCC)    skin   Coronary artery disease involving native coronary artery of native heart without angina pectoris 04/30/2010   LHC 9/09:  S/p BMS to pLAD // Myoview  4/18: No ischemia, low risk, not gated   Diabetes mellitus without complication (HCC)    Hyperlipidemia    Hypertension    Obesity (BMI 30-39.9) 09/28/2015   Overweight(278.02)    Permanent atrial fibrillation (HCC) 10/25/2014   Echo 10/14: EF 55-65%, mild MR, mod LAE, PASP 31 mmHg // Holter 9/16:  AFib, avg HR 70, rare HR up to 150, rare brady with 35 bpm and 3 sec pause during HS // Echo 4/18: EF 55-60, no RWMA, mild LAE, moderate RAE, trivial TR, PASP 28   Sleep apnea    Syncope and collapse 03/29/2010   TIA (transient ischemic attack)     Past Surgical History:  Procedure Laterality Date   CARDIAC CATHETERIZATION  9/09   PCI LAD (BMS)   nasal septal surgery     TONSILLECTOMY     WISDOM TOOTH EXTRACTION      Current Medications: Current Meds  Medication Sig   acetaminophen (TYLENOL) 500 MG tablet Take 500 mg by mouth every 6 (six) hours as needed for  headache (pain).   allopurinol  (ZYLOPRIM ) 100 MG tablet TAKE 1 TABLET BY MOUTH EVERY DAY   atorvastatin  (LIPITOR) 80 MG tablet Take 1 tablet (80 mg total) by mouth daily.   Cholecalciferol (VITAMIN D ) 50 MCG (2000 UT) CAPS Take 2,000 Units by mouth daily.    diltiazem  (CARDIZEM  CD) 180 MG 24 hr capsule TAKE 1 CAPSULE BY MOUTH EVERY DAY   ezetimibe  (ZETIA ) 10 MG tablet Take 1 tablet (10 mg total) by mouth daily. Please keep upcoming July appt for further refills. Thank you   lisinopril  (ZESTRIL ) 40 MG tablet Take 1 tablet (40 mg total) by mouth daily.    metFORMIN  (GLUCOPHAGE ) 500 MG tablet Take 1 tablet (500 mg total) by mouth daily with supper.   metoprolol  succinate (TOPROL -XL) 100 MG 24 hr tablet TAKE 1.5 TABLETS (150 MG TOTAL) BY MOUTH DAILY. TAKE 1 TABLET EVERY MORNING, 1/2 TABLET EVERY EVENING .TAKE WITH OR IMMEDIATELY FOLLOWING A MEAL.   Polyethyl Glycol-Propyl Glycol (SYSTANE OP) Place 1 drop into both eyes daily as needed (dry eyes).    rivaroxaban  (XARELTO ) 20 MG TABS tablet Take 1 tablet (20 mg total) by mouth daily with supper.     Allergies:   Penicillins   Social History   Socioeconomic History   Marital status: Single    Spouse name: Not on file   Number of children: Not on file   Years of education: Not on file   Highest education level: Master's degree (e.g., MA, MS, MEng, MEd, MSW, MBA)  Occupational History   Not on file  Tobacco Use   Smoking status: Former    Current packs/day: 0.00    Average packs/day: 1 pack/day for 30.0 years (30.0 ttl pk-yrs)    Types: Cigarettes    Start date: 08/26/1966    Quit date: 08/26/1996    Years since quitting: 27.7   Smokeless tobacco: Never  Substance and Sexual Activity   Alcohol use: Yes    Alcohol/week: 6.0 standard drinks of alcohol    Types: 6 Cans of beer per week    Comment: previously heavy, now trying to cut back   Drug use: No   Sexual activity: Not Currently  Other Topics Concern   Not on file  Social History Narrative   Not on file   Social Drivers of Health   Financial Resource Strain: Low Risk  (12/29/2023)   Overall Financial Resource Strain (CARDIA)    Difficulty of Paying Living Expenses: Not hard at all  Food Insecurity: No Food Insecurity (12/29/2023)   Hunger Vital Sign    Worried About Running Out of Food in the Last Year: Never true    Ran Out of Food in the Last Year: Never true  Transportation Needs: No Transportation Needs (12/29/2023)   PRAPARE - Administrator, Civil Service (Medical): No    Lack of Transportation (Non-Medical):  No  Physical Activity: Insufficiently Active (12/29/2023)   Exercise Vital Sign    Days of Exercise per Week: 4 days    Minutes of Exercise per Session: 30 min  Stress: No Stress Concern Present (12/29/2023)   [employer] of Occupational Health - Occupational Stress Questionnaire    Feeling of Stress : Only a little  Social Connections: Moderately Isolated (12/29/2023)   Social Connection and Isolation Panel    Frequency of Communication with Friends and Family: More than three times a week    Frequency of Social Gatherings with Friends and Family: Once a week  Attends Religious Services: 1 to 4 times per year    Active Member of Clubs or Organizations: No    Attends Engineer, structural: Not on file    Marital Status: Never married     Family History: The patient's family history includes Arthritis in his mother; Cancer in his maternal grandmother; Diabetes in his father; Early death in his father; Hearing loss in his mother; Heart attack in his maternal uncle; Heart disease in his father.  ROS:   Please see the history of present illness.      EKGs/Labs/Other Studies Reviewed:    The following studies were reviewed today:  EKG:  EKG is  ordered today.  The ekg ordered today demonstrates  Atrial fibrillation   Cardiac Studies & Procedures   ______________________________________________________________________________________________   STRESS TESTS  MYOCARDIAL PERFUSION IMAGING 06/13/2021  Interpretation Summary   The study is normal. The study is low risk.   No ST deviation was noted.   Nuclear stress EF: 57 %. The left ventricular ejection fraction is normal (55-65%). Left ventricular function is normal. End diastolic cavity size is normal.  Low risk stress nuclear study with normal perfusion and normal left ventricular regional and global systolic function.   ECHOCARDIOGRAM  ECHOCARDIOGRAM COMPLETE 01/05/2023  Narrative ECHOCARDIOGRAM  REPORT    Patient Name:   KAIRON SHOCK Date of Exam: 01/05/2023 Medical Rec #:  [id number]         Height:       73.5 in Accession #:    [id number]        Weight:       266.0 lb Date of Birth:  06-Dec-1950        BSA:          2.441 m Patient Age:    71 years          BP:           122/68 mmHg Patient Gender: M                 HR:           61 bpm. Exam Location:  [address]  Procedure: 2D Echo, Cardiac Doppler and Color Doppler  Indications:    I48.2 Chronic atrial fibrillation  History:        Patient has prior history of Echocardiogram examinations, most recent 01/27/2017. CAD, TIA, Arrythmias:Atrial Flutter, Signs/Symptoms:Syncope; Risk Factors:Hypertension, Diabetes, Dyslipidemia, Former Smoker and Sleep Apnea. Obesity.  Sonographer:    Jon Hacker RCS Referring Phys: [facility]  IMPRESSIONS   1. Left ventricular ejection fraction, by estimation, is 55%. The left ventricle has normal function. The left ventricle has no regional wall motion abnormalities. Left ventricular diastolic parameters are indeterminate. 2. Right ventricular systolic function is normal. The right ventricular size is normal. There is normal pulmonary artery systolic pressure. The estimated right ventricular systolic pressure is 30.7 mmHg. 3. Right atrial size was mildly dilated. 4. The mitral valve is grossly normal. Trivial mitral valve regurgitation. No evidence of mitral stenosis. 5. The aortic valve is tricuspid. There is mild calcification of the aortic valve. Aortic valve regurgitation is not visualized. No aortic stenosis is present. 6. The inferior vena cava is normal in size with greater than 50% respiratory variability, suggesting right atrial pressure of 3 mmHg.  FINDINGS Left Ventricle: Left ventricular ejection fraction, by estimation, is 55%. The left ventricle has normal function. The left ventricle has no regional wall motion abnormalities. The left ventricular internal  cavity size was normal in size. There is no left ventricular hypertrophy. Left ventricular diastolic parameters are indeterminate.  Right Ventricle: The right ventricular size is normal. No increase in right ventricular wall thickness. Right ventricular systolic function is normal. There is normal pulmonary artery systolic pressure. The tricuspid regurgitant velocity is 2.63 m/s, and with an assumed right atrial pressure of 3 mmHg, the estimated right ventricular systolic pressure is 30.7 mmHg.  Left Atrium: Left atrial size was normal in size.  Right Atrium: Right atrial size was mildly dilated.  Pericardium: There is no evidence of pericardial effusion.  Mitral Valve: The mitral valve is grossly normal. Trivial mitral valve regurgitation. No evidence of mitral valve stenosis.  Tricuspid Valve: The tricuspid valve is normal in structure. Tricuspid valve regurgitation is trivial. No evidence of tricuspid stenosis.  Aortic Valve: The aortic valve is tricuspid. There is mild calcification of the aortic valve. Aortic valve regurgitation is not visualized. No aortic stenosis is present.  Pulmonic Valve: The pulmonic valve was not well visualized. Pulmonic valve regurgitation is not visualized. No evidence of pulmonic stenosis.  Aorta: The aortic root is normal in size and structure.  Venous: The inferior vena cava is normal in size with greater than 50% respiratory variability, suggesting right atrial pressure of 3 mmHg.  IAS/Shunts: No atrial level shunt detected by color flow Doppler.   LEFT VENTRICLE PLAX 2D LVIDd:         5.00 cm   Diastology LVIDs:         2.90 cm   LV e' lateral: 14.13 cm/s LV PW:         0.90 cm LV IVS:        0.80 cm LVOT diam:     2.30 cm LV SV:         62 LV SV Index:   25 LVOT Area:     4.15 cm   RIGHT VENTRICLE RV Basal diam:  3.40 cm RV S prime:     10.83 cm/s TAPSE (M-mode): 1.1 cm RVSP:           35.7 mmHg  LEFT ATRIUM             Index         RIGHT ATRIUM           Index LA diam:        4.70 cm 1.93 cm/m   RA Pressure: 8.00 mmHg LA Vol (A2C):   86.3 ml 35.35 ml/m  RA Area:     28.50 cm LA Vol (A4C):   68.3 ml 27.98 ml/m  RA Volume:   87.30 ml  35.76 ml/m LA Biplane Vol: 78.6 ml 32.20 ml/m AORTIC VALVE LVOT Vmax:   65.17 cm/s LVOT Vmean:  44.100 cm/s LVOT VTI:    0.148 m  AORTA Ao Root diam: 3.30 cm Ao Asc diam:  3.30 cm  TRICUSPID VALVE TR Peak grad:   27.7 mmHg TR Vmax:        263.00 cm/s Estimated RAP:  8.00 mmHg RVSP:           35.7 mmHg  SHUNTS Systemic VTI:  0.15 m Systemic Diam: 2.30 cm  Soyla Merck MD Electronically signed by Soyla Merck MD Signature Date/Time: 01/05/2023/4:54:21 PM    Final    MONITORS  CARDIAC EVENT MONITOR 10/06/2018  Narrative  Atrial fibrillation predominates.  Rare instances of rapid ventricular response.  Frequent episodes of nocturnal pauses greater than 3 seconds. Longest episode greater than  4 seconds.  Atrial fib with nocturnal pauses possibly related to sleep apnea. Electrophysiology consult.       ______________________________________________________________________________________________       Recent Labs: 02/10/2024: ALT 15; BUN 16; Creatinine, Ser 0.94; Hemoglobin 14.7; Platelets 179.0; Potassium 4.5; Sodium 136  Recent Lipid Panel    Component Value Date/Time   CHOL 122 02/10/2024 1001   CHOL 147 09/18/2020 0947   TRIG 104.0 02/10/2024 1001   HDL 47.60 02/10/2024 1001   HDL 45 09/18/2020 0947   CHOLHDL 3 02/10/2024 1001   VLDL 20.8 02/10/2024 1001   LDLCALC 53 02/10/2024 1001   LDLCALC 76 09/18/2020 0947   LDLDIRECT 107.0 11/27/2016 1733        Physical Exam:    VS:  BP 132/83 (BP Location: Right Arm)   Pulse 66   Ht 6' 1.5 (1.867 m)   Wt 268 lb (121.6 kg)   SpO2 97%   BMI 34.88 kg/m     Wt Readings from Last 3 Encounters:  05/03/24 268 lb (121.6 kg)  02/10/24 274 lb 6.4 oz (124.5 kg)  01/01/24 272 lb 3.2 oz (123.5  kg)    GEN:  Well nourished, well developed in no acute distress HEENT: Normal NECK: No JVD CARDIAC: IRIR no murmurs, rubs, gallops RESPIRATORY:  Clear to auscultation without rales, wheezing or rhonchi  ABDOMEN: Soft, non-tender, non-distended, umbilical hernia MUSCULOSKELETAL:  No edema; No deformity  SKIN: Warm and dry NEUROLOGIC:  Alert and oriented x 3 PSYCHIATRIC:  Normal affect   ASSESSMENT:    1. Permanent atrial fibrillation (HCC)   2. Coronary artery disease involving native coronary artery of native heart without angina pectoris   3. Essential hypertension, benign     PLAN:    Permanent atrial fibrillation Permanent atrial fibrillation is well-managed with no symptoms of remodeling or valvular issues. Heart rates are controlled with current medications, and he is asymptomatic. - Continue diltiazem , metoprolol , and lisinopril . - Monitor for new symptoms such as chest pain or dyspnea. - Repeat echocardiogram if new symptoms develop. - continue AC- Hgb normal  Coronary artery disease with prior PCI Coronary artery disease with prior PCI in 2009. No recent symptoms of cardiac ischemia. Cholesterol levels are well controlled with atorvastatin  and Zetia . - Continue atorvastatin  80 mg and Zetia . - Monitor for new symptoms such as chest pain.  Hypertension Hypertension is well-controlled with occasional high readings. Blood pressure averages 130/80 mmHg. Previous hypotension episodes resolved after medication adjustment. - Continue current antihypertensive medications.  F/u with my team in one year      Stanly Leavens, MD FASE [facility] Cardiologist [facility]  [address] [place], [place] [address] [phone]  10:45 AM

## 2024-05-03 NOTE — Patient Instructions (Signed)
 Medication Instructions:  Your physician recommends that you continue on your current medications as directed. Please refer to the Current Medication list given to you today.  *If you need a refill on your cardiac medications before your next appointment, please call your pharmacy*  Lab Work: NONE  If you have labs (blood work) drawn today and your tests are completely normal, you will receive your results only by: MyChart Message (if you have MyChart) OR A paper copy in the mail If you have any lab test that is abnormal or we need to change your treatment, we will call you to review the results.  Testing/Procedures: NONE  Follow-Up: At [facility], you and your health needs are our priority.  As part of our continuing mission to provide you with exceptional heart care, our providers are all part of one team.  This team includes your primary Cardiologist (physician) and Advanced Practice Providers or APPs (Physician Assistants and Nurse Practitioners) who all work together to provide you with the care you need, when you need it.  Your next appointment:   1 year(s)  Provider:   One of our Advanced Practice Providers (APPs): Morse Clause, PA-C  Lamarr Satterfield, NP Miriam Shams, NP  Olivia Pavy, PA-C Josefa Beauvais, NP  Leontine Salen, PA-C Orren Fabry, PA-C  [place], [place] Jackee Alberts, NP  Damien Braver, NP Jon Hails, PA-C  Waddell Donath, PA-C    Dayna Dunn, PA-C  Glendia Ferrier, PA-C Lum Louis, NP Katlyn West, NP Callie Goodrich, PA-C  Evan Williams, PA-C Sheng Haley, PA-C  Xika Zhao, NP Kathleen Johnson, PA-C

## 2024-05-04 ENCOUNTER — Other Ambulatory Visit: Payer: Self-pay

## 2024-05-04 MED ORDER — METOPROLOL SUCCINATE ER 100 MG PO TB24: ORAL_TABLET | ORAL | 3 refills | Status: AC

## 2024-05-13 ENCOUNTER — Other Ambulatory Visit: Payer: Self-pay

## 2024-05-13 MED ORDER — EZETIMIBE 10 MG PO TABS: 10.0000 mg | ORAL_TABLET | Freq: Every day | ORAL | 3 refills | Status: AC

## 2024-06-02 ENCOUNTER — Other Ambulatory Visit: Payer: Self-pay | Admitting: Internal Medicine

## 2024-06-02 DIAGNOSIS — I4821 Permanent atrial fibrillation: Secondary | ICD-10-CM

## 2024-06-02 NOTE — Telephone Encounter (Signed)
 Prescription refill request for Xarelto received.  Indication: Afib  Last office visit: 05/03/24 Luiz)  Weight:  121.6kg Age: 73 Scr: 0.94 (02/10/24)  CrCl: 122.87ml/min  Appropriate dose. Refill sent.

## 2024-06-16 ENCOUNTER — Encounter (HOSPITAL_BASED_OUTPATIENT_CLINIC_OR_DEPARTMENT_OTHER): Payer: Self-pay | Admitting: Emergency Medicine

## 2024-06-16 ENCOUNTER — Emergency Department (HOSPITAL_BASED_OUTPATIENT_CLINIC_OR_DEPARTMENT_OTHER)
Admission: EM | Admit: 2024-06-16 | Discharge: 2024-06-17 | Discharge status: Against Medical Advice | Payer: Medicare (Managed Care) | Attending: Emergency Medicine | Admitting: Emergency Medicine

## 2024-06-16 ENCOUNTER — Other Ambulatory Visit: Payer: Self-pay

## 2024-06-16 DIAGNOSIS — Z5329 Procedure and treatment not carried out because of patient's decision for other reasons: Secondary | ICD-10-CM | POA: Insufficient documentation

## 2024-06-16 DIAGNOSIS — R4182 Altered mental status, unspecified: Secondary | ICD-10-CM | POA: Diagnosis present

## 2024-06-16 LAB — CBC
HCT: 44.4 % (ref 39.0–52.0)
Hemoglobin: 15.2 g/dL (ref 13.0–17.0)
MCH: 31.1 pg (ref 26.0–34.0)
MCHC: 34.2 g/dL (ref 30.0–36.0)
MCV: 90.8 fL (ref 80.0–100.0)
Platelets: 201 K/uL (ref 150–400)
RBC: 4.89 MIL/uL (ref 4.22–5.81)
RDW: 13.2 % (ref 11.5–15.5)
WBC: 8.6 K/uL (ref 4.0–10.5)
nRBC: 0 % (ref 0.0–0.2)

## 2024-06-16 LAB — COMPREHENSIVE METABOLIC PANEL WITH GFR
ALT: 21 U/L (ref 0–44)
AST: 39 U/L (ref 15–41)
Albumin: 4.5 g/dL (ref 3.5–5.0)
Alkaline Phosphatase: 82 U/L (ref 38–126)
Anion gap: 16 — ABNORMAL HIGH (ref 5–15)
BUN: 14 mg/dL (ref 8–23)
CO2: 20 mmol/L — ABNORMAL LOW (ref 22–32)
Calcium: 9.8 mg/dL (ref 8.9–10.3)
Chloride: 102 mmol/L (ref 98–111)
Creatinine, Ser: 0.99 mg/dL (ref 0.61–1.24)
GFR, Estimated: >60 mL/min (ref >60)
Glucose, Bld: 130 mg/dL — ABNORMAL HIGH (ref 70–99)
Potassium: 4.1 mmol/L (ref 3.5–5.1)
Sodium: 138 mmol/L (ref 135–145)
Total Bilirubin: 2.1 mg/dL — ABNORMAL HIGH (ref 0.0–1.2)
Total Protein: 7.5 g/dL (ref 6.5–8.1)

## 2024-06-16 LAB — URINALYSIS, MICROSCOPIC (REFLEX): WBC, UA: NONE SEEN WBC/hpf (ref 0–5)

## 2024-06-16 LAB — URINALYSIS, ROUTINE W REFLEX MICROSCOPIC
Bilirubin Urine: NEGATIVE
Glucose, UA: NEGATIVE mg/dL
Ketones, ur: NEGATIVE mg/dL
Leukocytes,Ua: NEGATIVE
Nitrite: NEGATIVE
Protein, ur: NEGATIVE mg/dL
Specific Gravity, Urine: 1.01 (ref 1.005–1.030)
pH: 5.5 (ref 5.0–8.0)

## 2024-06-16 LAB — CBG MONITORING, ED: Glucose-Capillary: 135 mg/dL — ABNORMAL HIGH (ref 70–99)

## 2024-06-16 NOTE — ED Triage Notes (Signed)
 Pt reports mental confusion- noticed today while in grocery store feeling out of it/ puzzled sx improved after rest.   AOx4, answers all questions appropriately.

## 2024-06-17 ENCOUNTER — Emergency Department (HOSPITAL_BASED_OUTPATIENT_CLINIC_OR_DEPARTMENT_OTHER): Payer: Medicare (Managed Care)

## 2024-06-17 ENCOUNTER — Other Ambulatory Visit: Payer: Self-pay

## 2024-06-17 DIAGNOSIS — R4182 Altered mental status, unspecified: Secondary | ICD-10-CM | POA: Diagnosis present

## 2024-06-17 MED ORDER — LORAZEPAM 2 MG/ML IJ SOLN: 1.0000 mg | INTRAMUSCULAR | Status: DC | PRN

## 2024-06-17 MED ORDER — LORAZEPAM 1 MG PO TABS: 1.0000 mg | ORAL_TABLET | ORAL | Status: DC | PRN

## 2024-06-17 MED ORDER — THIAMINE MONONITRATE 100 MG PO TABS: 100.0000 mg | ORAL_TABLET | Freq: Every day | ORAL | Status: DC

## 2024-06-17 MED ORDER — ADULT MULTIVITAMIN W/MINERALS CH: 1.0000 | ORAL_TABLET | Freq: Every day | ORAL | Status: DC

## 2024-06-17 MED ORDER — FOLIC ACID 1 MG PO TABS: 1.0000 mg | ORAL_TABLET | Freq: Every day | ORAL | Status: DC

## 2024-06-17 MED ORDER — THIAMINE HCL 100 MG/ML IJ SOLN: 100.0000 mg | Freq: Every day | INTRAMUSCULAR | Status: DC

## 2024-06-17 MED ORDER — DILTIAZEM HCL ER COATED BEADS 180 MG PO CP24: ORAL_CAPSULE | ORAL | 3 refills | Status: AC

## 2024-06-17 NOTE — ED Notes (Signed)
 Called [facility] that patient left AMA.  Cancel Bed Request.

## 2024-06-17 NOTE — Plan of Care (Signed)
 Plan of Care Note for accepted transfer   Patient name: Arthur Rich FMW:[id number] DOB: February 14, 1951  Facility requesting transfer: [facility] ED Requesting Provider: Dr. Haze Facility course: 73 year old male with history of CAD with prior PCI, diabetes, hypertension, hyperlipidemia, permanent A-fib on Xarelto , TIA, alcohol use disorder presented with sudden onset confusion.  Patient was at a store when he suddenly became confused at around 3:30 PM and since then having difficulty remembering things that have happened throughout the day.  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  No focal neurodeficits on exam.  Not hypoglycemic.  No signs of UTI.  CT head negative for acute intracranial abnormality.  Neurology recommended admission for brain MRI, EEG, and giving thiamine  due to history of alcohol use.  No signs of alcohol withdrawal in the ED.  Patient was placed on CIWA protocol; Ativan  as needed, thiamine , folic acid , and multivitamin.  Plan of care: The patient is accepted for admission to Telemetry unit at [facility].  [facility] will assume care on arrival to accepting facility. Until arrival, care as per EDP. However, [facility] available 24/7 for questions and assistance.  Check [email or web address] for on-call coverage.  Nursing staff, please call [facility] Admits & Consults System-Wide number under Amion on patient's arrival so appropriate admitting provider can evaluate the pt.

## 2024-06-17 NOTE — Discharge Instructions (Addendum)
 We evaluated you for your episode of confusion.  You are seen by the neurologist and they recommended that we admit you to the hospital for further testing including MRI and EEG.  You decided that you would not want to stay in the hospital and have left against medical advice.  You still need to follow-up with a neurologist so we have placed a neurology referral.  Please return if you have any recurrent symptoms or any other new symptoms like facial droop, trouble speaking, vision changes, numbness or tingling, weakness, trouble walking, chest pain, shortness of breath, seizures, loss of consciousness, or any other symptoms.

## 2024-06-17 NOTE — ED Provider Notes (Signed)
 [name] Provider Note   CSN: [id number] Arrival date & time: 06/16/24  2105     Patient presents with: Altered Mental Status   Arthur Rich is a 73 y.o. male.   Presents to the emergency department with concern over sudden onset of confusion.  Patient reports that he was at the store when he suddenly became confused.  He reports that this occurred around 3:30 PM.  Since then he has continued to have some confusion, reports that he has having trouble forming memories, keeps forgetting what has happened.  Patient reports that he had an episode of transient global amnesia 6 years ago and this feels similar.       Prior to Admission medications   Medication Sig Start Date End Date Taking? Authorizing Provider  acetaminophen (TYLENOL) 500 MG tablet Take 500 mg by mouth every 6 (six) hours as needed for headache (pain).    [provider]  allopurinol  (ZYLOPRIM ) 100 MG tablet TAKE 1 TABLET BY MOUTH EVERY DAY 01/25/24   Copland, Jessica C, MD  atorvastatin  (LIPITOR) 80 MG tablet Take 1 tablet (80 mg total) by mouth daily. 07/23/23   Copland, Jessica C, MD  Cholecalciferol (VITAMIN D ) 50 MCG (2000 UT) CAPS Take 2,000 Units by mouth daily.  07/01/13   Allred, Lynwood, MD  diltiazem  (CARDIZEM  CD) 180 MG 24 hr capsule TAKE 1 CAPSULE BY MOUTH EVERY DAY 06/12/23   Chandrasekhar, Mahesh A, MD  ezetimibe  (ZETIA ) 10 MG tablet Take 1 tablet (10 mg total) by mouth daily. 05/13/24   Santo Stanly DELENA, MD  lisinopril  (ZESTRIL ) 40 MG tablet Take 1 tablet (40 mg total) by mouth daily. 01/01/24   Copland, Harlene BROCKS, MD  metFORMIN  (GLUCOPHAGE ) 500 MG tablet Take 1 tablet (500 mg total) by mouth daily with supper. 07/23/23   Copland, Harlene BROCKS, MD  metoprolol  succinate (TOPROL -XL) 100 MG 24 hr tablet Take with or immediately following a meal.TAKE 1.5 TABLETS (150 MG TOTAL) BY MOUTH DAILY. TAKE 1 TABLET EVERY MORNING, 1/2 TABLET EVERY EVENING .TAKE WITH OR  IMMEDIATELY FOLLOWING A MEAL. 05/04/24   Chandrasekhar, Stanly DELENA, MD  Polyethyl Glycol-Propyl Glycol (SYSTANE OP) Place 1 drop into both eyes daily as needed (dry eyes).     [provider]  XARELTO  20 MG TABS tablet TAKE 1 TABLET BY MOUTH DAILY WITH SUPPER. 06/02/24   Chandrasekhar, Mahesh A, MD    Allergies: Penicillins    Review of Systems  Updated Vital Signs BP (!) 167/98 (BP Location: Left Arm)   Pulse 68   Temp 98.1 F (36.7 C) (Oral)   Resp 14   Ht 6' 1.5 (1.867 m)   Wt 122.5 kg   SpO2 99%   BMI 35.14 kg/m   Physical Exam Vitals and nursing note reviewed.  Constitutional:      General: He is not in acute distress.    Appearance: He is well-developed.  HENT:     Head: Normocephalic and atraumatic.     Mouth/Throat:     Mouth: Mucous membranes are moist.  Eyes:     General: Vision grossly intact. Gaze aligned appropriately.     Extraocular Movements: Extraocular movements intact.     Conjunctiva/sclera: Conjunctivae normal.  Cardiovascular:     Rate and Rhythm: Normal rate and regular rhythm.     Pulses: Normal pulses.     Heart sounds: Normal heart sounds, S1 normal and S2 normal. No murmur heard.    No friction  rub. No gallop.  Pulmonary:     Effort: Pulmonary effort is normal. No respiratory distress.     Breath sounds: Normal breath sounds.  Abdominal:     Palpations: Abdomen is soft.     Tenderness: There is no abdominal tenderness. There is no guarding or rebound.     Hernia: No hernia is present.  Musculoskeletal:        General: No swelling.     Cervical back: Full passive range of motion without pain, normal range of motion and neck supple. No pain with movement, spinous process tenderness or muscular tenderness. Normal range of motion.     Right lower leg: No edema.     Left lower leg: No edema.  Skin:    General: Skin is warm and dry.     Capillary Refill: Capillary refill takes less than 2 seconds.     Findings: No ecchymosis, erythema,  lesion or wound.  Neurological:     Mental Status: He is alert and oriented to person, place, and time.     GCS: GCS eye subscore is 4. GCS verbal subscore is 5. GCS motor subscore is 6.     Cranial Nerves: Cranial nerves 2-12 are intact.     Sensory: Sensation is intact.     Motor: Motor function is intact. No weakness or abnormal muscle tone.     Coordination: Coordination is intact.  Psychiatric:        Mood and Affect: Mood normal.        Speech: Speech normal.        Behavior: Behavior normal.     (all labs ordered are listed, but only abnormal results are displayed) Labs Reviewed  COMPREHENSIVE METABOLIC PANEL WITH GFR - Abnormal; Notable for the following components:      Result Value   CO2 20 (*)    Glucose, Bld 130 (*)    Total Bilirubin 2.1 (*)    Anion gap 16 (*)    All other components within normal limits  URINALYSIS, ROUTINE W REFLEX MICROSCOPIC - Abnormal; Notable for the following components:   Hgb urine dipstick SMALL (*)    All other components within normal limits  URINALYSIS, MICROSCOPIC (REFLEX) - Abnormal; Notable for the following components:   Bacteria, UA RARE (*)    All other components within normal limits  CBG MONITORING, ED - Abnormal; Notable for the following components:   Glucose-Capillary 135 (*)    All other components within normal limits  CBC    EKG: EKG Interpretation Date/Time:  Thursday June 16 2024 21:26:10 EDT Ventricular Rate:  109 PR Interval:    QRS Duration:  83 QT Interval:  326 QTC Calculation: 439 R Axis:   3  Text Interpretation: Atrial fibrillation Low voltage, precordial leads Abnormal R-wave progression, early transition Borderline repolarization abnormality Confirmed by Haze Lonni PARAS [id number]) on 06/16/2024 11:55:59 PM  Radiology: CT HEAD WO CONTRAST ( ) Result Date: 06/17/2024 CLINICAL DATA:  Altered mental status. EXAM: CT HEAD WITHOUT CONTRAST TECHNIQUE: Contiguous axial images were obtained from the  base of the skull through the vertex without intravenous contrast. RADIATION DOSE REDUCTION: This exam was performed according to the departmental dose-optimization program which includes automated exposure control, adjustment of the mA and/or kV according to patient size and/or use of iterative reconstruction technique. COMPARISON:  None Available. FINDINGS: Brain: There is generalized cerebral atrophy with widening of the extra-axial spaces and ventricular dilatation. There are areas of decreased attenuation within the white matter tracts  of the supratentorial brain, consistent with microvascular disease changes. Vascular: Marked severity bilateral cavernous carotid artery calcification is noted. Skull: Normal. Negative for fracture or focal lesion. Sinuses/Orbits: Chronic and postoperative changes are seen involving the bilateral maxillary sinuses and bilateral ethmoid sinuses. Moderate to marked severity bilateral ethmoid sinus and frontal sinus mucosal thickening is also seen. Other: None. IMPRESSION: 1. Generalized cerebral atrophy with chronic white matter small vessel ischemic changes. 2. No acute intracranial abnormality. 3. Chronic and postoperative changes involving the bilateral maxillary sinuses and bilateral ethmoid sinuses. 4. Moderate to marked severity bilateral ethmoid sinus and frontal sinus disease. Electronically Signed   By: Suzen Dials M.D.   On: 06/17/2024 00:23     Procedures   Medications Ordered in the ED  LORazepam  (ATIVAN ) tablet 1-4 mg (has no administration in time range)    Or  LORazepam  (ATIVAN ) injection 1-4 mg (has no administration in time range)  thiamine  (VITAMIN B1) tablet 100 mg (has no administration in time range)    Or  thiamine  (VITAMIN B1) injection 100 mg (has no administration in time range)  folic acid  (FOLVITE ) tablet 1 mg (has no administration in time range)  multivitamin with minerals tablet 1 tablet (has no administration in time range)                                     Medical Decision Making Amount and/or Complexity of Data Reviewed Labs: ordered. Decision-making details documented in ED Course. Radiology: ordered and independent interpretation performed. Decision-making details documented in ED Course. ECG/medicine tests: ordered and independent interpretation performed. Decision-making details documented in ED Course.  Risk OTC drugs. Prescription drug management. Decision regarding hospitalization.   Differential diagnosis considered includes, but not limited to:  TIA; Stroke; seizure; metabolic encephalopathy  Presents with sudden onset confusion.  Patient reports that he is having difficulty remembering things that have happened throughout the day.  He reports that this is reminiscent of an episode of transient global amnesia he had 6 years ago.  Patient without focal findings on exam here.  He is able to tell me fairly specifically what has happened throughout the day.  Neurology consultation has been performed.  Recommendation is for admission and further workup including MRI, EEG.     Final diagnoses:  Altered mental status, unspecified altered mental status type    ED Discharge Orders     None          Nylan Nakatani, Lonni PARAS, MD 06/17/24 [phone]

## 2024-06-17 NOTE — ED Notes (Signed)
 Pt said he would like to go home. Says that the symptoms that he was concerned about have resolved and he feels back to normal. Pt agreeable to sign paperwork in order to leave. Dr. Francesca made aware and went to bedside. D/c information placed. Pt educated on discharge information and AMA paperwork and signed documentation.

## 2024-06-17 NOTE — ED Notes (Signed)
 Pt in a tele stroke consult with the neuro cart at this time.  Will continue to monitor

## 2024-06-17 NOTE — ED Provider Notes (Addendum)
 Patient is currently boarding for inpatient bed for further workup for episode of altered mental status.  Was notified by nursing that patient was requesting to leave AGAINST MEDICAL ADVICE.  I discussed with the patient and he continued to request to leave AGAINST MEDICAL ADVICE.  I reviewed his chart and testing as well as neurology recommendations to stay in the hospital for further workup including MRI and EEG.  I discussed this with the patient including that we do not know the exact cause of his symptoms and further testing is required to rule out dangerous cause such as stroke, seizure, intracranial tumor, or other dangerous process.  Patient is alert and oriented, reports symptoms have resolved, demonstrates capacity to make his own medical decisions, demonstrates understanding of the recommended tests and procedures, and demonstrates understanding of risks of leaving AGAINST MEDICAL ADVICE including permanent disability, death, recurrent symptoms, worsening of condition.  Patient will be discharged AGAINST MEDICAL ADVICE.  I have placed a referral for neurology so the patient can follow-up outpatient.  He was instructed to return if he develops any new or worsening symptoms.   Francesca Elsie CROME, MD 06/17/24 TRACIE    Francesca Elsie CROME, MD 06/17/24 [phone]

## 2024-06-17 NOTE — Consult Note (Signed)
 [facility] Consult [facility]  Stat Consult  Patient Name:   Arthur Rich, Arthur Rich Date of Birth:   04/04/1951 Identification Number:   MRN - [id number] Date of Service:   06/17/2024 00:36:13  Diagnosis:       R41.82 - Altered mental status, unspecified  Impression 73 year old male with a history of atrial fibrillation on Xarelto , hypertension, diabetes, who presents with episode of acute confusional state and altered sensorium. Specifically recalls his events through the day though had trouble keeping track of what he read and felt he was different when he kept retrying. Symptoms are not specific to a cerebrovascular territory. Had similar episode in 2019 and transient global amnesia was considered at that point as he had DWI lesions suggestive of this. Atypical for transient global amnesia is the fact that he can recall the activities through his day, with no known loss of time. Antegrade amnesia is a defining feature of the diagnosis. It could be that he does not recall the periods where he did lose the ability to you store memory. Transient global amnesia can recur and he has had recent psychosocial stressor. Recommend brain MRI with and without contrast to rule out a structural cause to his symptoms. Would also need to assess for toxic/metabolic and common infectious processes that can contribute to altered mental status. EEG should also be considered this episode could represent a simple partial seizure. Has alcohol use, which she reports has 2-3 beers per day. Thiamine  deficiency one of the factors that can affect the memory centers. No other reported focal neurologic deficits   Recommendations: Our recommendations are outlined below.  Diagnostic Studies : MRI brain w/wo contrast Routine EEG  Laboratory Studies : Check B12/folate TSH Ammonia CMP CBC with diff ESR/CRP Lipid Panel HbA1c  Nursing Recommendations : Delirium precautions: Blinds open  during the day, closed at night, frequent reorientation, minimize nighttime interruptions  Consultations : Toxic metabolic work up per primary team  Disposition : Neurology will sign off. Reconsult if Needed. Additional Recommendations: Recommend thiamine  level. Would not be back at this point but would be helpful for further future assessment    ----------------------------------------------------------------------------------------------------   Advanced Imaging: Advanced Imaging Deferred because:  Stroke not suspected with clinical presentation and exam    Metrics: Dispatch Time: 06/17/2024 00:31:03 Callback Response Time: 06/17/2024 00:37:14  Primary Provider Notified of Diagnostic Impression and Management Plan on: 06/17/2024 01:19:19   CT HEAD: I personally reviewed all the CT images that were available to me and it showed: No evidence of an acute intracranial process    ----------------------------------------------------------------------------------------------------  Chief Complaint: altered mental status  History of Present Illness: Patient is a 73 year old Male. t about 3:30 PM today had altered sensorium knowing that something seemed off. He can recall his whole day but had trouble with reading words and it appeared different sentence compared to prior.  Reports history of similar episode more severe than current back in 2019. Was in [place] at that time. States he was told he had unspecified ischemia. Review of records shows there was concern for transient global amnesia based on imaging finding with a punctate right medial temporal lobe lesion. Other consideration was TIA and seizure. Plan report not available. He reports preserved memory through that event. Recent history notable for acute stressor with moving his mother to assisted living facility.  Has had history of heavy alcohol use in the past. Reports 2-3 beers per day. None yesterday. No  substance use within 30 years. Quit tobacco over  40 years ago.     Past Medical History:      Hypertension      Diabetes Mellitus      Hyperlipidemia      Atrial Fibrillation      Coronary Artery Disease Other PMH:  OSA  Medications:  Anticoagulant use:  Yes Xarelto  No Antiplatelet use Reviewed EMR for current medications  Allergies:  Reviewed Description: Penicillin  Social History: Smoking: Former Alcohol Use: Yes Drug Use: Former  Family History:  There is no family history of premature cerebrovascular disease pertinent to this consultation  ROS : 14 Points Review of Systems was performed and was negative except mentioned in HPI.  Past Surgical History: There Is No Surgical History Contributory To Today's Visit    Examination: BP(139/85), Pulse(72), Blood Glucose(138) 1A: Level of Consciousness - Alert; keenly responsive + 0 1B: Ask Month and Age - Both Questions Right + 0 1C: Blink Eyes & Squeeze Hands - Performs Both Tasks + 0 2: Test Horizontal Extraocular Movements - Normal + 0 3: Test Visual Fields - No Visual Loss + 0 4: Test Facial Palsy (Use Grimace if Obtunded) - Normal symmetry + 0 5A: Test Left Arm Motor Drift - No Drift for 10 Seconds + 0 5B: Test Right Arm Motor Drift - No Drift for 10 Seconds + 0 6A: Test Left Leg Motor Drift - No Drift for 5 Seconds + 0 6B: Test Right Leg Motor Drift - No Drift for 5 Seconds + 0 7: Test Limb Ataxia (FNF/Heel-Shin) - No Ataxia + 0 8: Test Sensation - Normal; No sensory loss + 0 9: Test Language/Aphasia - Normal; No aphasia + 0 10: Test Dysarthria - Normal + 0 11: Test Extinction/Inattention - No abnormality + 0  NIHSS Score: 0  Spoke with : Dr. Wanita    This consult was conducted in real time using interactive audio and Immunologist. Patient was informed of the technology being used for this visit and agreed to proceed. Patient located in hospital and provider located at home/office  setting.  Patient is being evaluated for possible acute neurologic impairment and high probability of imminent or life - threatening deterioration.I spent total of 35 minutes providing care to this patient, including time for face to face visit via telemedicine, review of medical records, imaging studies and discussion of findings with providers, the patient and / or family.   Dr Claudette Dates     [facility] For Inpatient follow-up with [facility] physician please call [facility] at [phone]. As we are not an outpatient service for any post hospital discharge needs please contact the hospital for assistance.  If you have any questions for the [facility] physicians or need to reconsult for clinical or diagnostic changes please contact us  via [facility] at [phone].   Signature : Claudette Dates

## 2024-06-22 ENCOUNTER — Encounter: Payer: Self-pay | Admitting: Neurology

## 2024-06-22 ENCOUNTER — Telehealth: Payer: Self-pay | Admitting: Neurology

## 2024-06-22 ENCOUNTER — Ambulatory Visit (INDEPENDENT_AMBULATORY_CARE_PROVIDER_SITE_OTHER): Payer: Medicare (Managed Care) | Admitting: Neurology

## 2024-06-22 VITALS — BP 151/85 | HR 60 | Ht 74.0 in | Wt 263.8 lb

## 2024-06-22 DIAGNOSIS — R200 Anesthesia of skin: Secondary | ICD-10-CM

## 2024-06-22 DIAGNOSIS — G454 Transient global amnesia: Secondary | ICD-10-CM

## 2024-06-22 DIAGNOSIS — R410 Disorientation, unspecified: Secondary | ICD-10-CM

## 2024-06-22 NOTE — Telephone Encounter (Signed)
 no auth required sent to GI [phone]

## 2024-06-22 NOTE — Progress Notes (Addendum)
 Subjective:    Patient ID: Arthur Rich is a 73 y.o. male.  HPI    True Mar, MD, PhD [facility] [address], [address] [address] [place], [place] [address]  I saw patient, Arthur Rich, as a referral from the emergency room for evaluation of his altered mental status.  The patient is unaccompanied today.  Arthur Rich is a 73 year old male with an underlying medical history of coronary artery disease, atrial flutter, atrial fibrillation, sleep apnea, history of syncope, history of TIA, diabetes, hypertension, hyperlipidemia, prior history of transient global amnesia, and, obesity, who reports a prior history of amnesia in the past.  He felt that several years ago his symptoms were worse.  He feels currently back to baseline and reports that he had confusion for a few hours but did not want to stay in the hospital as he went back to baseline within the ER.  He denies any strokelike symptoms such as sudden onset of one-sided numbness or tingling or droopy face or slurring of speech or weakness but does report numbness in his feet and legs.  He drinks alcohol nearly daily, about 2 beers at a time, typically up to 4 days a week, sometimes 3 beers at a time.  He quit smoking some 30 or 40 years ago.  He hydrates with water, estimates that he drinks about 2-3 bottles of water per day, drinks quite a bit of caffeine in the form of coffee, 3 to 4 cups/day.  He reports compliance with his BiPAP/ASV.  He has not fallen.  He likes to walk barefoot at home. He presented to the emergency room on 06/16/2024 with sudden onset of confusion.  He felt that his symptoms were similar to his episode of TGA some 6 years prior.  I reviewed the emergency room records.  He was recommended for inpatient admission but he requested to leave AMA.  He had a head CT without contrast on 06/17/2024 and I reviewed the results:    IMPRESSION: 1. 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 2. No acute intracranial abnormality. 3. Chronic and postoperative changes involving the bilateral maxillary sinuses and bilateral ethmoid sinuses. 4. Moderate to marked severity bilateral ethmoid sinus and frontal sinus disease.   In addition, I personally and independently reviewed images through the PACS system.  Laboratory testing showed elevated blood sugar at 130, total bilirubin mildly elevated at 2.1, CO2 below normal at 20, otherwise unremarkable.  CBC without differential unremarkable.  Urinalysis showed small amount of hemoglobin, otherwise unremarkable, UDS was not done.  Recent vitamin B12 level in April 2025 showed low normal B12 at 245.  Folate was normal at 11.2.  His ferritin level was normal at 244.7.  Hemoglobin A1c in April 2025 was 7.0.  He is followed by cardiology for his A-fib.  He is followed by [facility] for his obstructive sleep apnea.  I reviewed a sleep study report.  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  He was tried on BiPAP ST as well as ASV.  Recommendation was to start him on ASV with an EPAP of 5 cm with minimum pressure support of 3 cm, maximum pressure support of 15 cm.      His Past Medical History Is Significant For: Past Medical History:  Diagnosis Date   Atrial flutter (HCC)    Cancer (HCC)  skin   Coronary artery disease involving native coronary artery of native heart without angina pectoris 04/30/2010   LHC 9/09:  S/p BMS to pLAD // Myoview  4/18: No ischemia, low risk, not gated   Diabetes mellitus without complication (HCC)    Hyperlipidemia    Hypertension    Obesity (BMI 30-39.9) 09/28/2015   Overweight(278.02)    Permanent atrial fibrillation (HCC) 10/25/2014   Echo 10/14: EF 55-65%, mild MR, mod LAE, PASP 31 mmHg // Holter 9/16:  AFib, avg HR 70, rare HR up to 150, rare brady with 35 bpm and 3 sec pause  during HS // Echo 4/18: EF 55-60, no RWMA, mild LAE, moderate RAE, trivial TR, PASP 28   Sleep apnea    Bipap   Syncope and collapse 03/29/2010   TIA (transient ischemic attack)     His Past Surgical History Is Significant For: Past Surgical History:  Procedure Laterality Date   CARDIAC CATHETERIZATION  9/09   PCI LAD (BMS)   nasal septal surgery     TONSILLECTOMY     WISDOM TOOTH EXTRACTION      His Family History Is Significant For: Family History  Problem Relation Age of Onset   Diabetes Father    Heart disease Father    Early death Father    Arthritis Mother    Hearing loss Mother    Heart attack Maternal Uncle        early 72's   Cancer Maternal Grandmother        unknown ?leukemia    His Social History Is Significant For: Social History   Socioeconomic History   Marital status: Single    Spouse name: Not on file   Number of children: Not on file   Years of education: Not on file   Highest education level: Master's degree (e.g., MA, MS, MEng, MEd, MSW, MBA)  Occupational History   Not on file  Tobacco Use   Smoking status: Former    Current packs/day: 0.00    Average packs/day: 1 pack/day for 30.0 years (30.0 ttl pk-yrs)    Types: Cigarettes    Start date: 08/26/1966    Quit date: 08/26/1996    Years since quitting: 27.8   Smokeless tobacco: Never  Substance and Sexual Activity   Alcohol use: Yes    Alcohol/week: 6.0 standard drinks of alcohol    Types: 6 Cans of beer per week    Comment: previously heavy, now trying to cut back   Drug use: No   Sexual activity: Not Currently  Other Topics Concern   Not on file  Social History Narrative   Not on file   Social Drivers of Health   Financial Resource Strain: Low Risk  (12/29/2023)   Overall Financial Resource Strain (CARDIA)    Difficulty of Paying Living Expenses: Not hard at all  Food Insecurity: No Food Insecurity (12/29/2023)   Hunger Vital Sign    Worried About Running Out of Food in the Last  Year: Never true    Ran Out of Food in the Last Year: Never true  Transportation Needs: No Transportation Needs (12/29/2023)   PRAPARE - Administrator, Civil Service (Medical): No    Lack of Transportation (Non-Medical): No  Physical Activity: Insufficiently Active (12/29/2023)   Exercise Vital Sign    Days of Exercise per Week: 4 days    Minutes of Exercise per Session: 30 min  Stress: No Stress Concern Present (12/29/2023)   [employer] of  Occupational Health - Occupational Stress Questionnaire    Feeling of Stress : Only a little  Social Connections: Moderately Isolated (12/29/2023)   Social Connection and Isolation Panel    Frequency of Communication with Friends and Family: More than three times a week    Frequency of Social Gatherings with Friends and Family: Once a week    Attends Religious Services: 1 to 4 times per year    Active Member of [employer] or Organizations: No    Attends Engineer, structural: Not on file    Marital Status: Never married    His Allergies Are:  Allergies  Allergen Reactions   Penicillins Rash    Has patient had a PCN reaction causing immediate rash, facial/tongue/throat swelling, SOB or lightheadedness with hypotension: Yes Has patient had a PCN reaction causing severe rash involving mucus membranes or skin necrosis: No Has patient had a PCN reaction that required hospitalization: No Has patient had a PCN reaction occurring within the last 10 years: No If all of the above answers are NO, then may proceed with Cephalosporin use.  :   His Current Medications Are:  Outpatient Encounter Medications as of 06/22/2024  Medication Sig   acetaminophen (TYLENOL) 500 MG tablet Take 500 mg by mouth every 6 (six) hours as needed for headache (pain).   allopurinol  (ZYLOPRIM ) 100 MG tablet TAKE 1 TABLET BY MOUTH EVERY DAY   atorvastatin  (LIPITOR) 80 MG tablet Take 1 tablet (80 mg total) by mouth daily.   Cholecalciferol (VITAMIN D ) 50 MCG  (2000 UT) CAPS Take 2,000 Units by mouth daily.    diltiazem  (CARDIZEM  CD) 180 MG 24 hr capsule TAKE 1 CAPSULE BY MOUTH EVERY DAY   ezetimibe  (ZETIA ) 10 MG tablet Take 1 tablet (10 mg total) by mouth daily.   lisinopril  (ZESTRIL ) 40 MG tablet Take 1 tablet (40 mg total) by mouth daily.   metFORMIN  (GLUCOPHAGE ) 500 MG tablet Take 1 tablet (500 mg total) by mouth daily with supper.   metoprolol  succinate (TOPROL -XL) 100 MG 24 hr tablet Take with or immediately following a meal.TAKE 1.5 TABLETS (150 MG TOTAL) BY MOUTH DAILY. TAKE 1 TABLET EVERY MORNING, 1/2 TABLET EVERY EVENING .TAKE WITH OR IMMEDIATELY FOLLOWING A MEAL.   Polyethyl Glycol-Propyl Glycol (SYSTANE OP) Place 1 drop into both eyes daily as needed (dry eyes).    XARELTO  20 MG TABS tablet TAKE 1 TABLET BY MOUTH DAILY WITH SUPPER.   No facility-administered encounter medications on file as of 06/22/2024.  :   Review of Systems:  Out of a complete 14 point review of systems, all are reviewed and negative with the exception of these symptoms as listed below:   Review of Systems  Neurological:        AMS. Episode of confusion. 1.5 hr duration then ok, then about 8p then had another episode.   Had a 6 yrs ago TGA.     Objective:  Neurological Exam  Physical Exam Physical Examination:   Vitals:   06/22/24 0922  BP: (!) 151/85  Pulse: 60    General Examination: The patient is a very pleasant 73 y.o. male in no acute distress. He appears well-developed and well-nourished and well groomed.   HEENT: Normocephalic, atraumatic, pupils are equal, round and reactive to light, extraocular tracking is good without limitation to gaze excursion or nystagmus noted.  Corrective eyeglasses in place.  Hearing is grossly intact to tuning fork. Face is symmetric with normal facial animation and normal facial sensation to light  touch, temperature and vibration sense. Speech is clear with no dysarthria noted. There is no hypophonia. There is no lip,  neck/head, jaw or voice tremor. Neck is supple with full range of passive and active motion. There are no carotid bruits on auscultation. Oropharynx exam reveals: moderate mouth dryness, adequate dental hygiene and moderate airway crowding.  He has braces in place. Tongue protrudes centrally and palate elevates symmetrically.   Chest: Clear to auscultation without wheezing, rhonchi or crackles noted.  Heart: S1+S2+0, mildly irregular.     Abdomen: Soft, non-tender and non-distended.  Extremities: There is no pitting edema in the distal lower extremities bilaterally.  Prominent varicose veins both calves, left more than right.  Skin: Warm and dry without trophic changes noted.   Musculoskeletal: exam reveals no obvious joint deformities.  Left shoulder lower than right, upper body tilt to the right while standing.  He has no history of scoliosis.  Neurologically:  Mental status: The patient is awake, alert and oriented in all 4 spheres. His immediate and remote memory, attention, language skills and fund of knowledge are appropriate. There is no evidence of aphasia, agnosia, apraxia or anomia. Speech is clear with normal prosody and enunciation. Thought process is linear. Mood is normal and affect is normal.  Cranial nerves II - XII are as described above under HEENT exam.  Motor exam: Normal bulk, strength and tone is noted. There is no obvious action or resting tremor. Reflexes 1+ in the upper extremities and absent in the lower extremities, toes are downgoing bilaterally. Fine motor skills and coordination: grossly intact.  Cerebellar testing: No dysmetria or intention tremor. There is no truncal or gait ataxia.  Sensory exam: intact to light touch temperature and vibration sense in the upper extremities but decreased to all modalities in the distal lower extremities up to mid shin areas bilaterally.   Gait, station and balance: He stands somewhat slowly and stands wider base.   He walks  without a walking aid, somewhat slowly and cautiously.  Tandem walk not requested due to balance concerns.    Assessment and Plan:  In summary, Arthur Rich is a very pleasant 73 y.o.-year old male with an underlying medical history of coronary artery disease, atrial flutter, atrial fibrillation, sleep apnea, history of syncope, history of TIA, diabetes, hypertension, hyperlipidemia, prior history of transient global amnesia, and, obesity, who presents for evaluation of a transient episode of confusion without any obvious physical neurological accompaniments.  Differential diagnosis does include transient global amnesia, also altered mental status secondary to other/metabolic cause, sleep deprivation, or toxins, TIA less likely.    We will check vitamin B12 level as it was on the lower end of normal last time it was checked a few months ago.  We talked about the importance of maintaining a healthy lifestyle, treating the sleep apnea, staying well-hydrated and well rested.    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  We talked about the importance of good diabetes control and keeping the A1c below 7.0.  He was borderline last time it was checked.    Below is a summary of my recommendations and our discussion points from today's visit, based on chart review, history and examination. They were given these instructions verbally during the visit in detail and also in writing in the MyChart after visit summary (AVS), which they can access electronically.    <<  You may have had another episode of TGA (transient global amnesia). This is a  not fully or well understood condition, which typically happens only rarely or once in the patient's lifetime, during which patients can have some loss of time, difficulty forming new memories, typically no physical signs such as strokelike presentation. Patients with TGA frequently ask the same questions repeatedly and appear disoriented or  confused and may have variable inability to recall general or personal information (retrograde amnesia). During the episode of TGA, other cognitive functions are normal and there are typically no obvious physical abnormalities or distress other than apparent confusion. Episodes are self-limited, and by definition resolve within 24 hours, with recovery of the symptoms of memory function, except for what happened during the episode.  Rarely, patient may experience recurrent episodes. Patients typically continue to talk and move, but have no recollection of the period, which can last for minutes or hours. We don't quite understand the etiology or significance of TGA as yet. Some reports indicate a migraine-like etiology, some others mention a seizure-like presentation or even stroke-like origin. A single, definite etiology has not been determined, although epidemiologic and imaging data support several possible processes, including vascular, migraine, epileptic, and psychogenic mechanisms.  There is no specific treatment and management is typically geared towards identifying the syndrome, diagnosing and treating other disorders that may present similarly, and, ultimately, reassurance of the benign prognosis of TGA.  We will do an EEG (brainwave test), which we will schedule. We will call you with the results. We will do a brain scan, called MRI and call you with the test results. We will have to schedule you for this on a separate date. This test requires authorization from your insurance, and we will take care of the insurance process. Please reduce your nearly daily alcohol consumption to less than 1 drink/beer per day.  Reduce your caffeine intake to to 1-2 servings/day, as caffeine can cause palpitations and insomnia.   Increase your water intake to 3-4 bottles/day, 16.9 oz size each. We will check blood work today and call you with the test results. We will keep you posted as to your test results by phone  call or MyChart message and follow up in this clinic as needed.  Given your diabetes, I would not recommend that you walk barefoot. >>   This was an extended visit of over 60 minutes given copious record review involved and considerable counseling and coordination of care.  True Mar, MD, PhD

## 2024-06-22 NOTE — Patient Instructions (Addendum)
 It was nice to meet you today.   Here is what we discussed today and my recommendations for you:   You may have had another episode of TGA (transient global amnesia). This is a not fully or well understood condition, which typically happens only rarely or once in the patient's lifetime, during which patients can have some loss of time, difficulty forming new memories, typically no physical signs such as strokelike presentation. Patients with TGA frequently ask the same questions repeatedly and appear disoriented or confused and may have variable inability to recall general or personal information (retrograde amnesia). During the episode of TGA, other cognitive functions are normal and there are typically no obvious physical abnormalities or distress other than apparent confusion. Episodes are self-limited, and by definition resolve within 24 hours, with recovery of the symptoms of memory function, except for what happened during the episode.  Rarely, patient may experience recurrent episodes. Patients typically continue to talk and move, but have no recollection of the period, which can last for minutes or hours. We don't quite understand the etiology or significance of TGA as yet. Some reports indicate a migraine-like etiology, some others mention a seizure-like presentation or even stroke-like origin. A single, definite etiology has not been determined, although epidemiologic and imaging data support several possible processes, including vascular, migraine, epileptic, and psychogenic mechanisms.  There is no specific treatment and management is typically geared towards identifying the syndrome, diagnosing and treating other disorders that may present similarly, and, ultimately, reassurance of the benign prognosis of TGA.  We will do an EEG (brainwave test), which we will schedule. We will call you with the results. We will do a brain scan, called MRI and call you with the test results. We will have to  schedule you for this on a separate date. This test requires authorization from your insurance, and we will take care of the insurance process. Please reduce your nearly daily alcohol consumption to less than 1 drink/beer per day.  Reduce your caffeine intake to to 1-2 servings/day, as caffeine can cause palpitations and insomnia.   Increase your water intake to 3-4 bottles/day, 16.9 oz size each. We will check blood work today and call you with the test results. We will keep you posted as to your test results by phone call or MyChart message and follow up in this clinic as needed.  Given your diabetes, I would not recommend that you walk barefoot.

## 2024-06-23 ENCOUNTER — Ambulatory Visit: Payer: Self-pay | Admitting: Neurology

## 2024-06-23 LAB — B12 AND FOLATE PANEL
Folate: 9.1 ng/mL (ref >3.0)
Vitamin B-12: 314 pg/mL (ref 232–1245)

## 2024-06-23 LAB — TSH: TSH: 2.03 u[IU]/mL (ref 0.450–4.500)

## 2024-06-27 ENCOUNTER — Ambulatory Visit (INDEPENDENT_AMBULATORY_CARE_PROVIDER_SITE_OTHER): Payer: Medicare (Managed Care) | Admitting: Neurology

## 2024-06-27 DIAGNOSIS — R410 Disorientation, unspecified: Secondary | ICD-10-CM

## 2024-06-27 DIAGNOSIS — R4182 Altered mental status, unspecified: Secondary | ICD-10-CM

## 2024-06-27 DIAGNOSIS — R200 Anesthesia of skin: Secondary | ICD-10-CM

## 2024-06-27 DIAGNOSIS — G454 Transient global amnesia: Secondary | ICD-10-CM

## 2024-06-27 NOTE — Procedures (Signed)
   History:  73 year old man with episode of confusion   EEG classification:  Awake and asleep  Duration: 30 minutes   Technical aspects: This EEG study was done with scalp electrodes positioned according to the 10-20 International system of electrode placement. Electrical activity was reviewed with band pass filter of 1-70Hz , sensitivity of 7 uV/mm, display speed of 8mm/sec with a 60Hz  notched filter applied as appropriate. EEG data were recorded continuously and digitally stored.   Description of the recording: The background rhythms of this recording consists of a fairly well modulated medium amplitude background activity of 11 Hz. As the record progresses, the patient initially is in the waking state, but appears to enter the early stage II sleep during the recording, with rudimentary sleep spindles and vertex sharp wave activity seen. During the wakeful state, photic stimulation was performed, and no abnormal responses were seen. Hyperventilation  was also performed, no abnormal response seen. No epileptiform discharges seen during this recording. There was no focal slowing.   Abnormality: None   Impression: This is a normal awake and sleep EEG. No evidence of interictal epileptiform discharges. Normal EEGs, however, do not rule out epilepsy.    Jarome Trull, MD [facility]

## 2024-07-17 ENCOUNTER — Other Ambulatory Visit: Payer: Self-pay | Admitting: Family Medicine

## 2024-07-17 DIAGNOSIS — E782 Mixed hyperlipidemia: Secondary | ICD-10-CM

## 2024-07-18 ENCOUNTER — Encounter: Payer: Self-pay | Admitting: *Deleted

## 2024-07-19 ENCOUNTER — Ambulatory Visit
Admission: RE | Admit: 2024-07-19 | Discharge: 2024-07-19 | Disposition: A | Discharge status: Home / Self Care | Payer: Medicare (Managed Care) | Source: Ambulatory Visit | Attending: Neurology | Admitting: Neurology

## 2024-07-19 DIAGNOSIS — R410 Disorientation, unspecified: Secondary | ICD-10-CM

## 2024-07-19 DIAGNOSIS — R200 Anesthesia of skin: Secondary | ICD-10-CM | POA: Diagnosis not present

## 2024-07-19 DIAGNOSIS — G454 Transient global amnesia: Secondary | ICD-10-CM

## 2024-07-19 MED ORDER — GADOPICLENOL 0.5 MMOL/ML IV SOLN
10.0000 mL | Freq: Once | INTRAVENOUS | Status: AC | PRN
  Administered 2024-07-19: 10 mL via INTRAVENOUS

## 2024-08-04 ENCOUNTER — Other Ambulatory Visit: Payer: Self-pay | Admitting: Family Medicine

## 2024-08-04 DIAGNOSIS — E119 Type 2 diabetes mellitus without complications: Secondary | ICD-10-CM

## 2024-10-10 ENCOUNTER — Other Ambulatory Visit: Payer: Self-pay | Admitting: Family Medicine

## 2024-10-10 DIAGNOSIS — E782 Mixed hyperlipidemia: Secondary | ICD-10-CM

## 2024-10-11 ENCOUNTER — Other Ambulatory Visit: Payer: Self-pay | Admitting: Family Medicine

## 2024-10-11 DIAGNOSIS — E782 Mixed hyperlipidemia: Secondary | ICD-10-CM

## 2024-10-11 DIAGNOSIS — I10 Essential (primary) hypertension: Secondary | ICD-10-CM

## 2024-11-06 ENCOUNTER — Other Ambulatory Visit: Payer: Self-pay | Admitting: Family Medicine

## 2024-11-06 DIAGNOSIS — E782 Mixed hyperlipidemia: Secondary | ICD-10-CM

## 2025-01-03 ENCOUNTER — Ambulatory Visit
